# Patient Record
Sex: Male | Born: 1965 | Race: White | Hispanic: No | Marital: Married | Smoking: Never smoker
Health system: Southern US, Community
[De-identification: ages and names within clinical notes are randomized; demographics above are authoritative.]

## PROBLEM LIST (undated history)

## (undated) DIAGNOSIS — K5792 Diverticulitis of intestine, part unspecified, without perforation or abscess without bleeding: Secondary | ICD-10-CM

## (undated) DIAGNOSIS — M1990 Unspecified osteoarthritis, unspecified site: Secondary | ICD-10-CM

## (undated) DIAGNOSIS — M51369 Other intervertebral disc degeneration, lumbar region without mention of lumbar back pain or lower extremity pain: Secondary | ICD-10-CM

## (undated) DIAGNOSIS — F32A Depression, unspecified: Secondary | ICD-10-CM

## (undated) DIAGNOSIS — K219 Gastro-esophageal reflux disease without esophagitis: Secondary | ICD-10-CM

## (undated) DIAGNOSIS — I10 Essential (primary) hypertension: Secondary | ICD-10-CM

## (undated) DIAGNOSIS — G8929 Other chronic pain: Secondary | ICD-10-CM

## (undated) DIAGNOSIS — M5136 Other intervertebral disc degeneration, lumbar region: Secondary | ICD-10-CM

## (undated) DIAGNOSIS — N289 Disorder of kidney and ureter, unspecified: Secondary | ICD-10-CM

## (undated) HISTORY — PX: COLON SURGERY: SHX602

## (undated) HISTORY — PX: KNEE SURGERY: SHX244

---

## 2007-11-17 ENCOUNTER — Other Ambulatory Visit: Payer: Self-pay

## 2007-11-17 ENCOUNTER — Emergency Department: Payer: Self-pay

## 2012-03-31 ENCOUNTER — Observation Stay: Payer: Self-pay | Admitting: Internal Medicine

## 2012-03-31 LAB — URINALYSIS, COMPLETE
Blood: NEGATIVE
Glucose,UR: NEGATIVE mg/dL (ref 0–75)
RBC,UR: 9 /HPF (ref 0–5)
Specific Gravity: 1.027 (ref 1.003–1.030)
WBC UR: 15 /HPF (ref 0–5)

## 2012-03-31 LAB — COMPREHENSIVE METABOLIC PANEL
Albumin: 5.4 g/dL — ABNORMAL HIGH (ref 3.4–5.0)
Bilirubin,Total: 1.4 mg/dL — ABNORMAL HIGH (ref 0.2–1.0)
Calcium, Total: 10.4 mg/dL — ABNORMAL HIGH (ref 8.5–10.1)
Chloride: 100 mmol/L (ref 98–107)
Co2: 21 mmol/L (ref 21–32)
Creatinine: 2.63 mg/dL — ABNORMAL HIGH (ref 0.60–1.30)
Glucose: 163 mg/dL — ABNORMAL HIGH (ref 65–99)
Osmolality: 281 (ref 275–301)
Potassium: 4.1 mmol/L (ref 3.5–5.1)
SGOT(AST): 46 U/L — ABNORMAL HIGH (ref 15–37)
SGPT (ALT): 38 U/L
Sodium: 134 mmol/L — ABNORMAL LOW (ref 136–145)

## 2012-03-31 LAB — CBC
HCT: 51.2 % (ref 40.0–52.0)
MCHC: 33.5 g/dL (ref 32.0–36.0)
Platelet: 284 10*3/uL (ref 150–440)
RBC: 5.68 10*6/uL (ref 4.40–5.90)
RDW: 12.6 % (ref 11.5–14.5)
WBC: 17.3 10*3/uL — ABNORMAL HIGH (ref 3.8–10.6)

## 2012-03-31 LAB — CK TOTAL AND CKMB (NOT AT ARMC)
CK, Total: 966 U/L — ABNORMAL HIGH (ref 35–232)
CK-MB: 10.5 ng/mL — ABNORMAL HIGH (ref 0.5–3.6)

## 2012-03-31 LAB — MAGNESIUM: Magnesium: 2.2 mg/dL

## 2012-03-31 LAB — TROPONIN I: Troponin-I: <0.02 ng/mL

## 2012-04-01 LAB — CBC WITH DIFFERENTIAL/PLATELET
Basophil #: 0 10*3/uL (ref 0.0–0.1)
Eosinophil #: 0 10*3/uL (ref 0.0–0.7)
Eosinophil %: 0.1 %
HCT: 42.3 % (ref 40.0–52.0)
HGB: 14.1 g/dL (ref 13.0–18.0)
Lymphocyte #: 4.4 10*3/uL — ABNORMAL HIGH (ref 1.0–3.6)
MCH: 30.3 pg (ref 26.0–34.0)
MCHC: 33.3 g/dL (ref 32.0–36.0)
MCV: 91 fL (ref 80–100)
Monocyte #: 1.7 x10 3/mm — ABNORMAL HIGH (ref 0.2–1.0)
Neutrophil #: 10.4 10*3/uL — ABNORMAL HIGH (ref 1.4–6.5)
Neutrophil %: 62.8 %
Platelet: 228 10*3/uL (ref 150–440)
WBC: 16.5 10*3/uL — ABNORMAL HIGH (ref 3.8–10.6)

## 2012-04-01 LAB — LIPID PANEL
Cholesterol: 180 mg/dL (ref 0–200)
HDL Cholesterol: 34 mg/dL — ABNORMAL LOW (ref 40–60)
Ldl Cholesterol, Calc: 110 mg/dL — ABNORMAL HIGH (ref 0–100)
Triglycerides: 178 mg/dL (ref 0–200)

## 2012-04-01 LAB — BASIC METABOLIC PANEL
Anion Gap: 8 (ref 7–16)
Calcium, Total: 8.2 mg/dL — ABNORMAL LOW (ref 8.5–10.1)
Creatinine: 1.2 mg/dL (ref 0.60–1.30)
EGFR (African American): >60
Osmolality: 284 (ref 275–301)
Sodium: 140 mmol/L (ref 136–145)

## 2012-04-01 LAB — HEMOGLOBIN A1C: Hemoglobin A1C: 5.5 % (ref 4.2–6.3)

## 2012-10-04 ENCOUNTER — Emergency Department: Payer: Self-pay | Admitting: Emergency Medicine

## 2012-10-04 LAB — URINALYSIS, COMPLETE
Bacteria: NONE SEEN
Glucose,UR: NEGATIVE mg/dL (ref 0–75)
Ketone: NEGATIVE
Nitrite: NEGATIVE
Protein: NEGATIVE
RBC,UR: NONE SEEN /HPF (ref 0–5)
Squamous Epithelial: <1
WBC UR: NONE SEEN /HPF (ref 0–5)

## 2012-10-04 LAB — COMPREHENSIVE METABOLIC PANEL
Alkaline Phosphatase: 94 U/L (ref 50–136)
Bilirubin,Total: 1.3 mg/dL — ABNORMAL HIGH (ref 0.2–1.0)
Calcium, Total: 9.2 mg/dL (ref 8.5–10.1)
Chloride: 106 mmol/L (ref 98–107)
Co2: 23 mmol/L (ref 21–32)
Creatinine: 1.04 mg/dL (ref 0.60–1.30)
EGFR (Non-African Amer.): >60
Glucose: 115 mg/dL — ABNORMAL HIGH (ref 65–99)
SGOT(AST): 24 U/L (ref 15–37)
SGPT (ALT): 24 U/L (ref 12–78)
Total Protein: 7.5 g/dL (ref 6.4–8.2)

## 2012-10-04 LAB — CBC WITH DIFFERENTIAL/PLATELET
Basophil #: 0.1 10*3/uL (ref 0.0–0.1)
Eosinophil #: 0.1 10*3/uL (ref 0.0–0.7)
Eosinophil %: 0.5 %
HCT: 43.2 % (ref 40.0–52.0)
Lymphocyte #: 2.6 10*3/uL (ref 1.0–3.6)
MCHC: 34 g/dL (ref 32.0–36.0)
MCV: 89 fL (ref 80–100)
Monocyte #: 0.8 x10 3/mm (ref 0.2–1.0)
Monocyte %: 7.7 %
Neutrophil %: 67.1 %
Platelet: 256 10*3/uL (ref 150–440)
RBC: 4.87 10*6/uL (ref 4.40–5.90)
RDW: 12.7 % (ref 11.5–14.5)
WBC: 10.9 10*3/uL — ABNORMAL HIGH (ref 3.8–10.6)

## 2012-10-04 LAB — LIPASE, BLOOD: Lipase: 123 U/L (ref 73–393)

## 2013-12-28 ENCOUNTER — Emergency Department: Payer: Self-pay | Admitting: Emergency Medicine

## 2013-12-28 LAB — BASIC METABOLIC PANEL
ANION GAP: 5 — AB (ref 7–16)
BUN: 15 mg/dL (ref 7–18)
CHLORIDE: 108 mmol/L — AB (ref 98–107)
CO2: 26 mmol/L (ref 21–32)
Calcium, Total: 9 mg/dL (ref 8.5–10.1)
Creatinine: 1.02 mg/dL (ref 0.60–1.30)
EGFR (Non-African Amer.): >60
Glucose: 103 mg/dL — ABNORMAL HIGH (ref 65–99)
OSMOLALITY: 279 (ref 275–301)
Potassium: 4.6 mmol/L (ref 3.5–5.1)
Sodium: 139 mmol/L (ref 136–145)

## 2013-12-28 LAB — URINALYSIS, COMPLETE
Bacteria: NONE SEEN
Bilirubin,UR: NEGATIVE
Blood: NEGATIVE
Glucose,UR: NEGATIVE mg/dL (ref 0–75)
Ketone: NEGATIVE
Leukocyte Esterase: NEGATIVE
Nitrite: NEGATIVE
Ph: 6 (ref 4.5–8.0)
Protein: NEGATIVE
RBC,UR: NONE SEEN /HPF (ref 0–5)
Specific Gravity: 1.011 (ref 1.003–1.030)
Squamous Epithelial: NONE SEEN
WBC UR: NONE SEEN /HPF (ref 0–5)

## 2013-12-28 LAB — CBC
HCT: 39.4 % — AB (ref 40.0–52.0)
HGB: 13.4 g/dL (ref 13.0–18.0)
MCH: 30.3 pg (ref 26.0–34.0)
MCHC: 34.1 g/dL (ref 32.0–36.0)
MCV: 89 fL (ref 80–100)
PLATELETS: 226 10*3/uL (ref 150–440)
RBC: 4.42 10*6/uL (ref 4.40–5.90)
RDW: 14 % (ref 11.5–14.5)
WBC: 10.7 10*3/uL — AB (ref 3.8–10.6)

## 2014-04-10 ENCOUNTER — Emergency Department: Payer: Self-pay | Admitting: Emergency Medicine

## 2014-04-10 LAB — COMPREHENSIVE METABOLIC PANEL
ALT: 45 U/L (ref 12–78)
ANION GAP: 12 (ref 7–16)
AST: 36 U/L (ref 15–37)
Albumin: 4.6 g/dL (ref 3.4–5.0)
Alkaline Phosphatase: 87 U/L
BUN: 33 mg/dL — ABNORMAL HIGH (ref 7–18)
Bilirubin,Total: 1.4 mg/dL — ABNORMAL HIGH (ref 0.2–1.0)
CHLORIDE: 97 mmol/L — AB (ref 98–107)
CO2: 25 mmol/L (ref 21–32)
CREATININE: 1.7 mg/dL — AB (ref 0.60–1.30)
Calcium, Total: 9.7 mg/dL (ref 8.5–10.1)
GFR CALC AF AMER: 54 — AB
GFR CALC NON AF AMER: 46 — AB
GLUCOSE: 123 mg/dL — AB (ref 65–99)
Osmolality: 277 (ref 275–301)
Potassium: 3.8 mmol/L (ref 3.5–5.1)
Sodium: 134 mmol/L — ABNORMAL LOW (ref 136–145)
TOTAL PROTEIN: 8.4 g/dL — AB (ref 6.4–8.2)

## 2014-04-10 LAB — URINALYSIS, COMPLETE
BILIRUBIN, UR: NEGATIVE
BLOOD: NEGATIVE
Bacteria: NONE SEEN
GLUCOSE, UR: NEGATIVE mg/dL (ref 0–75)
Hyaline Cast: 14
KETONE: NEGATIVE
Leukocyte Esterase: NEGATIVE
Nitrite: NEGATIVE
PH: 5 (ref 4.5–8.0)
PROTEIN: NEGATIVE
RBC,UR: 1 /HPF (ref 0–5)
Specific Gravity: 1.025 (ref 1.003–1.030)

## 2014-04-10 LAB — CBC
HCT: 46.3 % (ref 40.0–52.0)
HGB: 15.5 g/dL (ref 13.0–18.0)
MCH: 29.4 pg (ref 26.0–34.0)
MCHC: 33.6 g/dL (ref 32.0–36.0)
MCV: 88 fL (ref 80–100)
PLATELETS: 283 10*3/uL (ref 150–440)
RBC: 5.29 10*6/uL (ref 4.40–5.90)
RDW: 13.7 % (ref 11.5–14.5)
WBC: 17.3 10*3/uL — AB (ref 3.8–10.6)

## 2014-04-10 LAB — DIFFERENTIAL
BASOS PCT: 0.7 %
Basophil #: 0.1 10*3/uL (ref 0.0–0.1)
EOS ABS: 0 10*3/uL (ref 0.0–0.7)
Eosinophil %: 0.3 %
LYMPHS ABS: 4.9 10*3/uL — AB (ref 1.0–3.6)
LYMPHS PCT: 28.5 %
MONO ABS: 1.6 x10 3/mm — AB (ref 0.2–1.0)
Monocyte %: 9.3 %
Neutrophil #: 10.6 10*3/uL — ABNORMAL HIGH (ref 1.4–6.5)
Neutrophil %: 61.2 %

## 2014-04-10 LAB — BASIC METABOLIC PANEL
ANION GAP: 7 (ref 7–16)
BUN: 27 mg/dL — ABNORMAL HIGH (ref 7–18)
Calcium, Total: 8.4 mg/dL — ABNORMAL LOW (ref 8.5–10.1)
Chloride: 102 mmol/L (ref 98–107)
Co2: 26 mmol/L (ref 21–32)
Creatinine: 1.56 mg/dL — ABNORMAL HIGH (ref 0.60–1.30)
EGFR (African American): 60 — ABNORMAL LOW
GFR CALC NON AF AMER: 51 — AB
GLUCOSE: 101 mg/dL — AB (ref 65–99)
Osmolality: 275 (ref 275–301)
POTASSIUM: 3.5 mmol/L (ref 3.5–5.1)
Sodium: 135 mmol/L — ABNORMAL LOW (ref 136–145)

## 2014-04-10 LAB — CK: CK, Total: 585 U/L — ABNORMAL HIGH

## 2014-04-10 LAB — LIPASE, BLOOD: LIPASE: 123 U/L (ref 73–393)

## 2014-04-10 LAB — TROPONIN I: Troponin-I: <0.02 ng/mL

## 2014-05-18 ENCOUNTER — Emergency Department: Payer: Self-pay | Admitting: Emergency Medicine

## 2014-05-18 LAB — CBC WITH DIFFERENTIAL/PLATELET
Basophil #: 0.1 10*3/uL (ref 0.0–0.1)
Basophil %: 0.4 %
EOS ABS: 0.1 10*3/uL (ref 0.0–0.7)
Eosinophil %: 0.3 %
HCT: 45.6 % (ref 40.0–52.0)
HGB: 15.5 g/dL (ref 13.0–18.0)
Lymphocyte #: 3.5 10*3/uL (ref 1.0–3.6)
Lymphocyte %: 22.6 %
MCH: 29.8 pg (ref 26.0–34.0)
MCHC: 34 g/dL (ref 32.0–36.0)
MCV: 88 fL (ref 80–100)
MONO ABS: 0.9 x10 3/mm (ref 0.2–1.0)
Monocyte %: 5.8 %
NEUTROS ABS: 10.8 10*3/uL — AB (ref 1.4–6.5)
NEUTROS PCT: 70.9 %
Platelet: 294 10*3/uL (ref 150–440)
RBC: 5.21 10*6/uL (ref 4.40–5.90)
RDW: 13.9 % (ref 11.5–14.5)
WBC: 15.3 10*3/uL — ABNORMAL HIGH (ref 3.8–10.6)

## 2014-05-18 LAB — URINALYSIS, COMPLETE
BLOOD: NEGATIVE
Bacteria: NONE SEEN
Bilirubin,UR: NEGATIVE
GLUCOSE, UR: NEGATIVE mg/dL (ref 0–75)
Ketone: NEGATIVE
LEUKOCYTE ESTERASE: NEGATIVE
NITRITE: NEGATIVE
PH: 6 (ref 4.5–8.0)
Protein: NEGATIVE
SQUAMOUS EPITHELIAL: NONE SEEN
Specific Gravity: 1.027 (ref 1.003–1.030)
WBC UR: 1 /HPF (ref 0–5)

## 2014-05-18 LAB — BASIC METABOLIC PANEL
ANION GAP: 7 (ref 7–16)
BUN: 27 mg/dL — AB (ref 7–18)
CO2: 25 mmol/L (ref 21–32)
Calcium, Total: 9.1 mg/dL (ref 8.5–10.1)
Chloride: 104 mmol/L (ref 98–107)
Creatinine: 1.66 mg/dL — ABNORMAL HIGH (ref 0.60–1.30)
EGFR (African American): 55 — ABNORMAL LOW
EGFR (Non-African Amer.): 48 — ABNORMAL LOW
GLUCOSE: 133 mg/dL — AB (ref 65–99)
Osmolality: 279 (ref 275–301)
Potassium: 4.1 mmol/L (ref 3.5–5.1)
SODIUM: 136 mmol/L (ref 136–145)

## 2014-05-18 LAB — CK
CK, Total: 542 U/L — ABNORMAL HIGH
CK, Total: 413 U/L — ABNORMAL HIGH

## 2014-10-13 ENCOUNTER — Emergency Department: Payer: Self-pay | Admitting: Emergency Medicine

## 2015-01-31 ENCOUNTER — Emergency Department: Admit: 2015-01-31 | Discharge status: Against Medical Advice | Payer: Self-pay | Admitting: Emergency Medicine

## 2015-01-31 LAB — CBC WITH DIFFERENTIAL/PLATELET
Basophil #: 0.1 10*3/uL (ref 0.0–0.1)
Basophil %: 0.5 %
EOS PCT: 0.1 %
Eosinophil #: 0 10*3/uL (ref 0.0–0.7)
HCT: 45.9 % (ref 40.0–52.0)
HGB: 15.7 g/dL (ref 13.0–18.0)
LYMPHS ABS: 2.4 10*3/uL (ref 1.0–3.6)
Lymphocyte %: 19 %
MCH: 29.9 pg (ref 26.0–34.0)
MCHC: 34.2 g/dL (ref 32.0–36.0)
MCV: 87 fL (ref 80–100)
MONOS PCT: 4.2 %
Monocyte #: 0.5 x10 3/mm (ref 0.2–1.0)
NEUTROS ABS: 9.5 10*3/uL — AB (ref 1.4–6.5)
Neutrophil %: 76.2 %
Platelet: 272 10*3/uL (ref 150–440)
RBC: 5.26 10*6/uL (ref 4.40–5.90)
RDW: 13.7 % (ref 11.5–14.5)
WBC: 12.5 10*3/uL — ABNORMAL HIGH (ref 3.8–10.6)

## 2015-01-31 LAB — COMPREHENSIVE METABOLIC PANEL
ALK PHOS: 81 U/L
ALT: 57 U/L
AST: 43 U/L — AB
Albumin: 4.6 g/dL
Anion Gap: 7 (ref 7–16)
BILIRUBIN TOTAL: 1.3 mg/dL — AB
BUN: 23 mg/dL — AB
Calcium, Total: 8.8 mg/dL — ABNORMAL LOW
Chloride: 104 mmol/L
Co2: 24 mmol/L
Creatinine: 1.08 mg/dL
EGFR (African American): >60
EGFR (Non-African Amer.): >60
Glucose: 150 mg/dL — ABNORMAL HIGH
Potassium: 4.2 mmol/L
Sodium: 135 mmol/L
Total Protein: 7.9 g/dL

## 2015-01-31 LAB — LIPASE, BLOOD: LIPASE: 27 U/L

## 2015-02-10 NOTE — H&P (Signed)
PATIENT NAME:  Gregory Oconnell, Gregory Oconnell MR#:  [id number] DATE OF BIRTH:  1966/08/08  DATE OF ADMISSION:  03/31/2012  CHIEF COMPLAINT: Weakness, muscle cramps for two days.   PRIMARY CARE PHYSICIAN: Nonlocal. REFERRING PHYSICIAN: Dr. Renard Hamper  HISTORY OF PRESENT ILLNESS: 49 year old gentleman with no past medical history presented to the ED with above chief complaint. Patient is alert, awake, oriented in no acute distress. He said he has been working outside feeling weak, thirsty, dizzy, hot and sweating a lot. In addition he has generalized body muscle cramps so he came to ED for further evaluation. He was noted to have elevated creatinine about 2.4 and increased CK, CK-MB so he was treated with normal saline bolus for heat exhaustion and admitted for acute renal failure. Patient denies any fever, chills. No chest pain, palpitation, orthopnea, or nocturnal dyspnea. No abdominal pain but has nausea. No vomiting or diarrhea. Patient has urine early this morning but so far no urine.    PAST MEDICAL HISTORY: No.   PAST SURGICAL HISTORY: Right knee tendon repair.   SOCIAL HISTORY: No smoking, alcohol drinking, but smokes some marijuana sometimes.   FAMILY HISTORY: Diabetes.   MEDICATIONS: No.  ALLERGIES: No.    REVIEW OF SYSTEMS: CONSTITUTIONAL: Patient denies any fever or chills. No headache but has dizziness, generalized weakness. HEENT: No double vision, blurred vision. No postnasal drip, epistaxis or dysphagia. RESPIRATORY: No cough, sputum, shortness of breath, or hemoptysis. CARDIOVASCULAR: No chest pain, palpitation, orthopnea or nocturnal dyspnea. No leg edema. GASTROINTESTINAL: Has nausea but no vomiting or diarrhea. No abdominal pain. No melena, bloody stool. GENITOURINARY: No dysuria, hematuria, or incontinence but has oliguria. ENDOCRINE: No polyuria, polydipsia but thirsty for the past two days. No heat or cold intolerance. HEMATOLOGY: No easy bruising, bleeding. NEUROLOGY: No syncope, loss of  consciousness or seizure.   PHYSICAL EXAMINATION:  VITAL SIGNS: Temperature 98, blood pressure 149/97, pulse 60, oxygen saturation 99% on room air, respiration 22.   GENERAL: Patient is alert, awake, oriented in no acute distress.   HEENT: Pupils round, equal, reactive to light and accommodation. Mild dry oral mucosa. Clear oropharynx.   NECK: Supple. No JVD or carotid bruit. No lymphadenopathy. No thyromegaly.   CARDIOVASCULAR: S1, S2 regular rate, rhythm. No murmurs, gallops.   PULMONARY: Bilateral air entry. No wheezing, rales.   ABDOMEN: Soft. No distention or tenderness. No organomegaly. Bowel sounds present.   EXTREMITIES: No edema, clubbing, or cyanosis. No calf tenderness. Strong bilateral pedal pulses.   SKIN: No rash or jaundice.   NEUROLOGY: Alert and oriented x3. No focal deficit. Power 5/5. Sensation intact. Deep tendon reflexes 2+.   LABORATORY, DIAGNOSTIC AND RADIOLOGICAL DATA: CK 966, CK-MB 10.5, glucose 163, BUN 37, creatinine 2.63, sodium 134, chloride 4.1, bicarbonate 21, anion gap 13, WBC 17.3, hemoglobin 17.2, platelets 284. EKG shows sinus bradycardia with marked sinus arrhythmia at 56.   IMPRESSION:  1. Heat exhaustion.  2. Acute renal failure and dehydration.  3. Rhabdomyolysis.  4. Leukocytosis, possibly due to reaction.  5. Bradycardia.  6. Hyponatremia.   PLAN OF TREATMENT:  1. Patient will be placed for observation. Patient was treated with normal saline bolus. Will continue normal saline at 200 mL/h, encourage fluid intake and follow-up BMP, CK, CK-MB.  2. Will check lipid panel, hemoglobin A1c.   3. Patient's blood pressure is high, possible has hypertension or high blood pressure is due to stress. We will monitor blood pressure and may need hypertension medication later.   Discussed the patient's situation  and plan of treatment with the patient.  TIME SPENT: About 50 minutes.  ____________________________ Demetrios Loll, MD qc:cms D: 03/31/2012  16:15:49 ET T: 03/31/2012 20:34:47 ET JOB#: [id number]  cc: Demetrios Loll, MD, <Dictator> Demetrios Loll MD ELECTRONICALLY SIGNED 04/01/2012 19:39

## 2015-02-10 NOTE — Discharge Summary (Signed)
PATIENT NAME:  Gregory Oconnell, Gregory Oconnell MR#:  [id number] DATE OF BIRTH:  11-09-1965  DATE OF ADMISSION:  03/31/2012 DATE OF DISCHARGE:  04/01/2012  ADMISSION DIAGNOSES:  1. Acute renal failure.  2. Mild rhabdomyolysis.   DISCHARGE DIAGNOSES:  1. Acute renal failure.  2. Mild rhabdomyolysis.  3. Urinary tract infection.  4. Leukocytosis.   PERTINENT LABORATORY AT DISCHARGE: Sodium 140, potassium 4.0, chloride 110, bicarb 22, BUN 23, creatinine 1.20, glucose 116. LDL 110, VLDL 36, cholesterol 180, triglycerides 178, HDL 34. Hemoglobin A1c 5.5. CK 602. White blood cells 16.5, hemoglobin 14, hematocrit 43, platelets 228.   HOSPITAL COURSE: This is a 49 year old male who presented with dehydration and acute renal failure. For further details, please refer to the history and physical.  1. Acute renal failure secondary to dehydration. The patient's creatinine is back to baseline after IV fluids. The patient's dehydration is secondary to heat exhaustion. The patient was counseled on making sure he has enough fluids when he is participating in activities outside. 2. Rhabdomyolysis, improved with IV fluids.  3. Urinary tract infection. The patient was started on ciprofloxacin.  4. Leukocytosis from urinary tract infection.   DISCHARGE MEDICATIONS: Ciprofloxacin 500 b.i.d. for seven days.   DISCHARGE DIET: Regular.   DISCHARGE ACTIVITY: As tolerated.   DISCHARGE FOLLOW-UP: [facility] in one week.   TIME SPENT: 35 minutes.    ____________________________ Donell Beers. Benjie Karvonen, MD spm:drc D: 04/01/2012 12:42:22 ET T: 04/02/2012 15:31:28 ET JOB#: [id number]  cc: Brandis Wixted P. Benjie Karvonen, MD, <Dictator> [facility] Paislynn Hegstrom P Marrian Bells MD ELECTRONICALLY SIGNED 04/03/2012 21:30

## 2016-01-02 ENCOUNTER — Other Ambulatory Visit: Payer: Self-pay | Admitting: Nurse Practitioner

## 2016-01-02 DIAGNOSIS — R109 Unspecified abdominal pain: Principal | ICD-10-CM

## 2016-01-02 DIAGNOSIS — R110 Nausea: Secondary | ICD-10-CM

## 2016-01-02 DIAGNOSIS — R195 Other fecal abnormalities: Secondary | ICD-10-CM

## 2016-01-02 DIAGNOSIS — K219 Gastro-esophageal reflux disease without esophagitis: Secondary | ICD-10-CM

## 2016-01-02 DIAGNOSIS — G8929 Other chronic pain: Secondary | ICD-10-CM

## 2016-01-02 DIAGNOSIS — K529 Noninfective gastroenteritis and colitis, unspecified: Secondary | ICD-10-CM

## 2016-01-09 ENCOUNTER — Ambulatory Visit
Admission: RE | Admit: 2016-01-09 | Discharge: 2016-01-09 | Disposition: A | Discharge status: Home / Self Care | Payer: Medicaid Other | Source: Ambulatory Visit | Attending: Nurse Practitioner | Admitting: Nurse Practitioner

## 2016-01-09 DIAGNOSIS — K219 Gastro-esophageal reflux disease without esophagitis: Secondary | ICD-10-CM

## 2016-01-09 DIAGNOSIS — G8929 Other chronic pain: Secondary | ICD-10-CM | POA: Diagnosis present

## 2016-01-09 DIAGNOSIS — K529 Noninfective gastroenteritis and colitis, unspecified: Secondary | ICD-10-CM

## 2016-01-09 DIAGNOSIS — K760 Fatty (change of) liver, not elsewhere classified: Secondary | ICD-10-CM | POA: Insufficient documentation

## 2016-01-09 DIAGNOSIS — R109 Unspecified abdominal pain: Secondary | ICD-10-CM | POA: Diagnosis not present

## 2016-01-09 DIAGNOSIS — R195 Other fecal abnormalities: Secondary | ICD-10-CM

## 2016-01-09 DIAGNOSIS — R110 Nausea: Secondary | ICD-10-CM

## 2016-01-09 DIAGNOSIS — K921 Melena: Secondary | ICD-10-CM | POA: Diagnosis present

## 2016-01-09 MED ORDER — IOPAMIDOL (ISOVUE-370) INJECTION 76%
100.0000 mL | Freq: Once | INTRAVENOUS | Status: AC | PRN
  Administered 2016-01-09: 100 mL via INTRAVENOUS

## 2016-01-15 ENCOUNTER — Other Ambulatory Visit: Payer: Self-pay | Admitting: Nurse Practitioner

## 2016-01-15 ENCOUNTER — Other Ambulatory Visit: Payer: Self-pay | Admitting: *Deleted

## 2016-01-15 DIAGNOSIS — K760 Fatty (change of) liver, not elsewhere classified: Secondary | ICD-10-CM

## 2016-01-24 ENCOUNTER — Ambulatory Visit
Admission: RE | Admit: 2016-01-24 | Discharge: 2016-01-24 | Disposition: A | Discharge status: Home / Self Care | Payer: Medicaid Other | Source: Ambulatory Visit | Attending: Nurse Practitioner | Admitting: Nurse Practitioner

## 2016-01-24 DIAGNOSIS — K760 Fatty (change of) liver, not elsewhere classified: Secondary | ICD-10-CM | POA: Diagnosis present

## 2016-01-24 DIAGNOSIS — M7541 Impingement syndrome of right shoulder: Secondary | ICD-10-CM | POA: Insufficient documentation

## 2016-01-30 ENCOUNTER — Encounter: Payer: Self-pay | Admitting: *Deleted

## 2016-02-03 ENCOUNTER — Ambulatory Visit: Payer: Medicaid Other | Admitting: Anesthesiology

## 2016-02-03 ENCOUNTER — Ambulatory Visit
Admission: RE | Admit: 2016-02-03 | Discharge: 2016-02-03 | Disposition: A | Discharge status: Home / Self Care | Payer: Medicaid Other | Source: Ambulatory Visit | Attending: Gastroenterology | Admitting: Gastroenterology

## 2016-02-03 ENCOUNTER — Encounter
Admission: RE | Disposition: A | Discharge status: Home / Self Care | Payer: Self-pay | Source: Ambulatory Visit | Attending: Gastroenterology

## 2016-02-03 ENCOUNTER — Encounter: Payer: Self-pay | Admitting: *Deleted

## 2016-02-03 DIAGNOSIS — K210 Gastro-esophageal reflux disease with esophagitis: Secondary | ICD-10-CM | POA: Diagnosis not present

## 2016-02-03 DIAGNOSIS — K529 Noninfective gastroenteritis and colitis, unspecified: Secondary | ICD-10-CM | POA: Diagnosis not present

## 2016-02-03 DIAGNOSIS — M1990 Unspecified osteoarthritis, unspecified site: Secondary | ICD-10-CM | POA: Diagnosis not present

## 2016-02-03 DIAGNOSIS — R195 Other fecal abnormalities: Secondary | ICD-10-CM | POA: Diagnosis not present

## 2016-02-03 DIAGNOSIS — B3781 Candidal esophagitis: Secondary | ICD-10-CM | POA: Insufficient documentation

## 2016-02-03 DIAGNOSIS — R1030 Lower abdominal pain, unspecified: Secondary | ICD-10-CM | POA: Insufficient documentation

## 2016-02-03 HISTORY — PX: COLONOSCOPY WITH PROPOFOL: SHX5780

## 2016-02-03 HISTORY — DX: Unspecified osteoarthritis, unspecified site: M19.90

## 2016-02-03 HISTORY — DX: Gastro-esophageal reflux disease without esophagitis: K21.9

## 2016-02-03 HISTORY — DX: Disorder of kidney and ureter, unspecified: N28.9

## 2016-02-03 HISTORY — PX: ESOPHAGOGASTRODUODENOSCOPY (EGD) WITH PROPOFOL: SHX5813

## 2016-02-03 LAB — KOH PREP

## 2016-02-03 SURGERY — ESOPHAGOGASTRODUODENOSCOPY (EGD) WITH PROPOFOL
Anesthesia: General

## 2016-02-03 MED ORDER — PROPOFOL 10 MG/ML IV BOLUS
INTRAVENOUS | Status: DC | PRN
  Administered 2016-02-03: 150 mg via INTRAVENOUS
  Administered 2016-02-03: 50 mg via INTRAVENOUS

## 2016-02-03 MED ORDER — PROPOFOL 500 MG/50ML IV EMUL
INTRAVENOUS | Status: DC | PRN
  Administered 2016-02-03: 150 ug/kg/min via INTRAVENOUS

## 2016-02-03 MED ORDER — FENTANYL CITRATE (PF) 100 MCG/2ML IJ SOLN
INTRAMUSCULAR | Status: DC | PRN
  Administered 2016-02-03: 50 ug via INTRAVENOUS

## 2016-02-03 MED ORDER — SODIUM CHLORIDE 0.9 % IV SOLN
INTRAVENOUS | Status: DC
  Administered 2016-02-03: 1000 mL via INTRAVENOUS

## 2016-02-03 NOTE — Transfer of Care (Signed)
Immediate Anesthesia Transfer of Care Note  Patient: Gregory Oconnell  Procedure(s) Performed: Procedure(s): ESOPHAGOGASTRODUODENOSCOPY (EGD) WITH PROPOFOL (N/A) COLONOSCOPY WITH PROPOFOL (N/A)  Patient Location: PACU  Anesthesia Type:General  Level of Consciousness: awake, alert , oriented and patient cooperative  Airway & Oxygen Therapy: Patient Spontanous Breathing and Patient connected to nasal cannula oxygen  Post-op Assessment: Report given to RN, Post -op Vital signs reviewed and stable and Patient moving all extremities  Post vital signs: Reviewed and stable  Last Vitals:  Filed Vitals:   02/03/16 1004  BP: 149/89  Pulse: 61  Temp: 36.7 C  Resp: 20    Complications: No apparent anesthesia complications

## 2016-02-03 NOTE — Op Note (Signed)
[facility] Gastroenterology Patient Name: Gregory Oconnell Procedure Date: 02/03/2016 10:28 AM MRN: [id number] Account #: [id number] Date of Birth: July 14, 1966 Admit Type: Outpatient Age: 50 Room: [facility] ENDO ROOM 4 Gender: Male Note Status: Finalized Procedure:            Upper GI endoscopy Indications:          Suspected esophageal reflux, Nausea with vomiting Providers:            Lupita Dawn. Candace Cruise, MD Referring MD:         Dr Juanda Crumble [facility], MD (Referring MD) Medicines:            Monitored Anesthesia Care Complications:        No immediate complications. Procedure:            Pre-Anesthesia Assessment:                       - Prior to the procedure, a History and Physical was                        performed, and patient medications, allergies and                        sensitivities were reviewed. The patient's tolerance of                        previous anesthesia was reviewed.                       - The risks and benefits of the procedure and the                        sedation options and risks were discussed with the                        patient. All questions were answered and informed                        consent was obtained.                       - After reviewing the risks and benefits, the patient                        was deemed in satisfactory condition to undergo the                        procedure.                       After obtaining informed consent, the endoscope was                        passed under direct vision. Throughout the procedure,                        the patient's blood pressure, pulse, and oxygen                        saturations were monitored continuously. The Endoscope  was introduced through the mouth, and advanced to the                        second part of duodenum. The upper GI endoscopy was                        accomplished without difficulty. The patient tolerated   the procedure well. Findings:      Mildly severe esophagitis was found in the middle third of the       esophagus. Cells for cytology were obtained by brushing.      There were esophageal mucosal changes suspicious for short-segment       Barrett's esophagus present in the lower third of the esophagus. The       maximum longitudinal extent of these mucosal changes was 2 cm in length.       Mucosa was biopsied with a cold forceps for histology in 4 quadrants.       One specimen bottle was sent to pathology.      The exam was otherwise without abnormality.      The entire examined stomach was normal.      The examined duodenum was normal. Impression:           - Mildly severe candidiasis esophagitis. Cells for                        cytology obtained.                       - Esophageal mucosal changes suspicious for                        short-segment Barrett's esophagus. Biopsied.                       - The examination was otherwise normal.                       - Normal stomach.                       - Normal examined duodenum. Recommendation:       - Discharge patient to home.                       - Observe patient's clinical course.                       - Await pathology results.                       - The findings and recommendations were discussed with                        the patient. Procedure Code(s):    --- Professional ---                       [phone], Esophagogastroduodenoscopy, flexible, transoral;                        with biopsy, single or multiple Diagnosis Code(s):    --- Professional ---  B37.81, Candidal esophagitis                       K22.8, Other specified diseases of esophagus                       R11.2, Nausea with vomiting, unspecified CPT copyright 2016 American Medical Association. All rights reserved. The codes documented in this report are preliminary and upon coder review may  be revised to meet current compliance  requirements. Hulen Luster, MD 02/03/2016 10:42:49 AM This report has been signed electronically. Number of Addenda: 0 Note Initiated On: 02/03/2016 10:28 AM      [facility]

## 2016-02-03 NOTE — Anesthesia Preprocedure Evaluation (Signed)
Anesthesia Evaluation  Patient identified by MRN, date of birth, ID band Patient awake    Reviewed: Allergy & Precautions, H&P , NPO status , Patient's Chart, lab work & pertinent test results, reviewed documented beta blocker date and time   Airway Mallampati: II   Neck ROM: full    Dental  (+) Teeth Intact   Pulmonary neg pulmonary ROS,    Pulmonary exam normal        Cardiovascular negative cardio ROS Normal cardiovascular exam Rate:Normal     Neuro/Psych negative neurological ROS  negative psych ROS   GI/Hepatic negative GI ROS, Neg liver ROS, GERD  ,  Endo/Other  negative endocrine ROS  Renal/GU Renal diseasenegative Renal ROS  negative genitourinary   Musculoskeletal   Abdominal   Peds  Hematology negative hematology ROS (+)   Anesthesia Other Findings Past Medical History:   GERD (gastroesophageal reflux disease)                       Kidney disease                                               Arthritis                                                  Past Surgical History:   KNEE SURGERY                                                BMI    Body Mass Index   33.08 kg/m 2     Reproductive/Obstetrics                             Anesthesia Physical Anesthesia Plan  ASA: II  Anesthesia Plan: General   Post-op Pain Management:    Induction:   Airway Management Planned:   Additional Equipment:   Intra-op Plan:   Post-operative Plan:   Informed Consent: I have reviewed the patients History and Physical, chart, labs and discussed the procedure including the risks, benefits and alternatives for the proposed anesthesia with the patient or authorized representative who has indicated his/her understanding and acceptance.   Dental Advisory Given  Plan Discussed with: CRNA  Anesthesia Plan Comments:         Anesthesia Quick Evaluation

## 2016-02-03 NOTE — Op Note (Signed)
[facility] Gastroenterology Patient Name: Gregory Oconnell Procedure Date: 02/03/2016 10:26 AM MRN: [id number] Account #: [id number] Date of Birth: 1966/09/28 Admit Type: Outpatient Age: 50 Room: [facility] ENDO ROOM 4 Gender: Male Note Status: Finalized Procedure:            Colonoscopy Indications:          Lower abdominal pain, Chronic diarrhea, Heme positive                        stool Providers:            Lupita Dawn. Candace Cruise, MD Referring MD:         Dr Juanda Crumble [facility], MD (Referring MD) Medicines:            Monitored Anesthesia Care Complications:        No immediate complications. Procedure:            Pre-Anesthesia Assessment:                       - Prior to the procedure, a History and Physical was                        performed, and patient medications, allergies and                        sensitivities were reviewed. The patient's tolerance of                        previous anesthesia was reviewed.                       - The risks and benefits of the procedure and the                        sedation options and risks were discussed with the                        patient. All questions were answered and informed                        consent was obtained.                       - After reviewing the risks and benefits, the patient                        was deemed in satisfactory condition to undergo the                        procedure.                       After obtaining informed consent, the colonoscope was                        passed under direct vision. Throughout the procedure,                        the patient's blood pressure, pulse, and oxygen  saturations were monitored continuously. The                        Colonoscope was introduced through the anus and                        advanced to the the cecum, identified by appendiceal                        orifice and ileocecal valve. The colonoscopy was          performed without difficulty. The patient tolerated the                        procedure well. The quality of the bowel preparation                        was poor. Findings:      The colon (entire examined portion) appeared normal. Biopsies for       histology were taken with a cold forceps from the entire colon for       evaluation of microscopic colitis. Impression:           - Preparation of the colon was poor.                       - The entire examined colon is normal. Biopsied. Recommendation:       - Await pathology results.                       - Repeat colonoscopy in 5-10 years for surveillance.                       - The findings and recommendations were discussed with                        the patient.                       - Needs better prrep next time. Procedure Code(s):    --- Professional ---                       [phone], Colonoscopy, flexible; with biopsy, single or                        multiple Diagnosis Code(s):    --- Professional ---                       R10.30, Lower abdominal pain, unspecified                       K52.9, Noninfective gastroenteritis and colitis,                        unspecified                       R19.5, Other fecal abnormalities CPT copyright 2016 American Medical Association. All rights reserved. The codes documented in this report are preliminary and upon coder review may  be revised to meet current compliance requirements. Hulen Luster, MD 02/03/2016 10:59:24 AM This report has been signed electronically. Number  of Addenda: 0 Note Initiated On: 02/03/2016 10:26 AM Scope Withdrawal Time: 0 hours 4 minutes 13 seconds  Total Procedure Duration: 0 hours 12 minutes 1 second       [facility]

## 2016-02-03 NOTE — H&P (Signed)
    Primary Care Physician:  Donnie Coffin, MD Primary Gastroenterologist:  Dr. Candace Cruise  Pre-Procedure History & Physical: HPI:  Gregory Oconnell is a 50 y.o. male is here for an EGD/colonoscopy.   Past Medical History  Diagnosis Date  . GERD (gastroesophageal reflux disease)   . Kidney disease   . Arthritis     Past Surgical History  Procedure Laterality Date  . Knee surgery      Prior to Admission medications   Medication Sig Start Date End Date Taking? Authorizing Provider  gabapentin (NEURONTIN) 300 MG capsule Take 300 mg by mouth daily.   Yes Historical Provider, MD  omeprazole (PRILOSEC) 40 MG capsule Take 40 mg by mouth daily.   Yes Historical Provider, MD    Allergies as of 01/18/2016  . (No Known Allergies)    History reviewed. No pertinent family history.  Social History   Social History  . Marital Status: Married    Spouse Name: N/A  . Number of Children: N/A  . Years of Education: N/A   Occupational History  . Not on file.   Social History Main Topics  . Smoking status: Never Smoker   . Smokeless tobacco: Never Used  . Alcohol Use: Yes  . Drug Use: Yes    Special: Marijuana  . Sexual Activity: Not on file   Other Topics Concern  . Not on file   Social History Narrative    Review of Systems: See HPI, otherwise negative ROS  Physical Exam: BP 149/89 mmHg  Pulse 61  Temp(Src) 98 F (36.7 C) (Oral)  Resp 20  Ht 6' (1.829 m)  Wt 110.678 kg (244 lb)  BMI 33.09 kg/m2  SpO2 99% General:   Alert,  pleasant and cooperative in NAD Head:  Normocephalic and atraumatic. Neck:  Supple; no masses or thyromegaly. Lungs:  Clear throughout to auscultation.    Heart:  Regular rate and rhythm. Abdomen:  Soft, nontender and nondistended. Normal bowel sounds, without guarding, and without rebound.   Neurologic:  Alert and  oriented x4;  grossly normal neurologically.  Impression/Plan: RANIER VANDERWOUDE is here for an EGD/colonoscopy to be performed for  nausea, vomiting, diarrhea, abdominal pain, heme positive stool  Risks, benefits, limitations, and alternatives regarding EGD/colonoscopy have been reviewed with the patient.  Questions have been answered.  All parties agreeable.   Trinton Prewitt, Lupita Dawn, MD  02/03/2016, 10:21 AM

## 2016-02-03 NOTE — Anesthesia Postprocedure Evaluation (Signed)
Anesthesia Post Note  Patient: Gregory Oconnell  Procedure(s) Performed: Procedure(s) (LRB): ESOPHAGOGASTRODUODENOSCOPY (EGD) WITH PROPOFOL (N/A) COLONOSCOPY WITH PROPOFOL (N/A)  Patient location during evaluation: PACU Anesthesia Type: General Level of consciousness: awake and alert Pain management: pain level controlled Vital Signs Assessment: post-procedure vital signs reviewed and stable Respiratory status: spontaneous breathing, nonlabored ventilation, respiratory function stable and patient connected to nasal cannula oxygen Cardiovascular status: blood pressure returned to baseline and stable Postop Assessment: no signs of nausea or vomiting Anesthetic complications: no    Last Vitals:  Filed Vitals:   02/03/16 1004  BP: 149/89  Pulse: 61  Temp: 36.7 C  Resp: 20    Last Pain:  Filed Vitals:   02/03/16 1113  PainSc: [place] G Adams

## 2016-02-03 NOTE — Addendum Note (Signed)
Addendum  created 02/03/16 1156 by Kevion Fatheree, CRNA   Modules edited: Anesthesia Flowsheet    

## 2016-02-04 LAB — SURGICAL PATHOLOGY

## 2016-03-16 ENCOUNTER — Emergency Department
Admission: EM | Admit: 2016-03-16 | Discharge: 2016-03-16 | Disposition: A | Discharge status: Home / Self Care | Payer: Medicaid Other | Attending: Student | Admitting: Student

## 2016-03-16 ENCOUNTER — Emergency Department: Payer: Medicaid Other

## 2016-03-16 ENCOUNTER — Encounter: Payer: Self-pay | Admitting: Emergency Medicine

## 2016-03-16 DIAGNOSIS — R1030 Lower abdominal pain, unspecified: Secondary | ICD-10-CM | POA: Diagnosis present

## 2016-03-16 DIAGNOSIS — R109 Unspecified abdominal pain: Secondary | ICD-10-CM

## 2016-03-16 DIAGNOSIS — M1990 Unspecified osteoarthritis, unspecified site: Secondary | ICD-10-CM | POA: Insufficient documentation

## 2016-03-16 DIAGNOSIS — R1031 Right lower quadrant pain: Secondary | ICD-10-CM

## 2016-03-16 DIAGNOSIS — R52 Pain, unspecified: Secondary | ICD-10-CM

## 2016-03-16 LAB — CBC WITH DIFFERENTIAL/PLATELET
BASOS ABS: 0.1 10*3/uL (ref 0–0.1)
Eosinophils Absolute: 0.3 10*3/uL (ref 0–0.7)
HCT: 41.7 % (ref 40.0–52.0)
Hemoglobin: 14 g/dL (ref 13.0–18.0)
Lymphocytes Relative: 35 %
Lymphs Abs: 4.4 10*3/uL — ABNORMAL HIGH (ref 1.0–3.6)
MCH: 29.8 pg (ref 26.0–34.0)
MCHC: 33.7 g/dL (ref 32.0–36.0)
MCV: 88.4 fL (ref 80.0–100.0)
MONO ABS: 0.7 10*3/uL (ref 0.2–1.0)
Monocytes Relative: 6 %
Neutro Abs: 7.2 10*3/uL — ABNORMAL HIGH (ref 1.4–6.5)
Neutrophils Relative %: 55 %
PLATELETS: 253 10*3/uL (ref 150–440)
RBC: 4.72 MIL/uL (ref 4.40–5.90)
RDW: 14.2 % (ref 11.5–14.5)
WBC: 12.8 10*3/uL — ABNORMAL HIGH (ref 3.8–10.6)

## 2016-03-16 LAB — COMPREHENSIVE METABOLIC PANEL
ALBUMIN: 4.1 g/dL (ref 3.5–5.0)
ALT: 34 U/L (ref 17–63)
ANION GAP: 7 (ref 5–15)
AST: 31 U/L (ref 15–41)
Alkaline Phosphatase: 67 U/L (ref 38–126)
BUN: 13 mg/dL (ref 6–20)
CHLORIDE: 107 mmol/L (ref 101–111)
CO2: 25 mmol/L (ref 22–32)
Calcium: 9.1 mg/dL (ref 8.9–10.3)
Creatinine, Ser: 1.21 mg/dL (ref 0.61–1.24)
GFR calc Af Amer: >60 mL/min (ref >60)
GLUCOSE: 106 mg/dL — AB (ref 65–99)
POTASSIUM: 3.8 mmol/L (ref 3.5–5.1)
Sodium: 139 mmol/L (ref 135–145)
Total Bilirubin: 1 mg/dL (ref 0.3–1.2)
Total Protein: 7.2 g/dL (ref 6.5–8.1)

## 2016-03-16 LAB — URINALYSIS COMPLETE WITH MICROSCOPIC (ARMC ONLY)
Bacteria, UA: NONE SEEN
Bilirubin Urine: NEGATIVE
Glucose, UA: NEGATIVE mg/dL
HGB URINE DIPSTICK: NEGATIVE
KETONES UR: NEGATIVE mg/dL
LEUKOCYTES UA: NEGATIVE
NITRITE: NEGATIVE
PH: 6 (ref 5.0–8.0)
PROTEIN: 30 mg/dL — AB
SPECIFIC GRAVITY, URINE: 1.023 (ref 1.005–1.030)
Squamous Epithelial / LPF: NONE SEEN

## 2016-03-16 MED ORDER — PROMETHAZINE HCL 12.5 MG PO TABS: 12.5000 mg | ORAL_TABLET | Freq: Four times a day (QID) | ORAL | Status: DC | PRN

## 2016-03-16 MED ORDER — MORPHINE SULFATE (PF) 4 MG/ML IV SOLN
4.0000 mg | Freq: Once | INTRAVENOUS | Status: AC
  Administered 2016-03-16: 4 mg via INTRAVENOUS
  Filled 2016-03-16: qty 1

## 2016-03-16 MED ORDER — KETOROLAC TROMETHAMINE 30 MG/ML IJ SOLN
INTRAMUSCULAR | Status: AC
  Administered 2016-03-16: 15 mg via INTRAVENOUS
  Filled 2016-03-16: qty 1

## 2016-03-16 MED ORDER — HYDROCODONE-ACETAMINOPHEN 5-325 MG PO TABS: 1.0000 | ORAL_TABLET | ORAL | Status: DC | PRN

## 2016-03-16 MED ORDER — SODIUM CHLORIDE 0.9 % IV BOLUS (SEPSIS)
1000.0000 mL | Freq: Once | INTRAVENOUS | Status: AC
  Administered 2016-03-16: 1000 mL via INTRAVENOUS

## 2016-03-16 MED ORDER — ONDANSETRON HCL 4 MG/2ML IJ SOLN
4.0000 mg | Freq: Once | INTRAMUSCULAR | Status: AC
  Administered 2016-03-16: 4 mg via INTRAVENOUS
  Filled 2016-03-16: qty 2

## 2016-03-16 MED ORDER — KETOROLAC TROMETHAMINE 30 MG/ML IJ SOLN
15.0000 mg | Freq: Once | INTRAMUSCULAR | Status: AC
  Administered 2016-03-16: 15 mg via INTRAVENOUS

## 2016-03-16 NOTE — ED Notes (Signed)
Patient back from ultrasound and CTscan.

## 2016-03-16 NOTE — ED Notes (Signed)
Patient left for Ultrasound.

## 2016-03-16 NOTE — Discharge Instructions (Signed)

## 2016-03-16 NOTE — ED Notes (Signed)
E-signature pad did not function. Patient unable to sign. Patient left for home with wife.

## 2016-03-16 NOTE — ED Provider Notes (Signed)
I was asked to follow up on CT scan by Dr. Edd Fabian, CT is unremarkable. Discussed results with patient. He is feeling improved.  Lavonia Drafts, MD 03/16/16 2204

## 2016-03-16 NOTE — ED Provider Notes (Signed)
[facility] Emergency Department Provider Note   ____________________________________________  Time seen: Approximately 7:01 PM  I have reviewed the triage vital signs and the nursing notes.   HISTORY  Chief Complaint Flank Pain and Groin Pain    HPI Gregory Oconnell is a 50 y.o. male with GERD who presents for evaluation of right flank pain, right lower abdominal pain and right groin/testicle pain gradual onset today, constant, no modifying factors, currently severe. No fevers or chills. He has had nausea and one episode non-bloody nonbilious emesis. No diarrhea, no fevers or chills. No chest pain or difficulty breathing.   Past Medical History  Diagnosis Date  . GERD (gastroesophageal reflux disease)   . Kidney disease   . Arthritis     There are no active problems to display for this patient.   Past Surgical History  Procedure Laterality Date  . Knee surgery    . Esophagogastroduodenoscopy (egd) with propofol N/A 02/03/2016    Procedure: ESOPHAGOGASTRODUODENOSCOPY (EGD) WITH PROPOFOL;  Surgeon: Hulen Luster, MD;  Location: [facility] ENDOSCOPY;  Service: Gastroenterology;  Laterality: N/A;  . Colonoscopy with propofol N/A 02/03/2016    Procedure: COLONOSCOPY WITH PROPOFOL;  Surgeon: Hulen Luster, MD;  Location: [facility] ENDOSCOPY;  Service: Gastroenterology;  Laterality: N/A;    Current Outpatient Rx  Name  Route  Sig  Dispense  Refill  . gabapentin (NEURONTIN) 300 MG capsule   Oral   Take 300 mg by mouth daily.         Marland Kitchen omeprazole (PRILOSEC) 40 MG capsule   Oral   Take 40 mg by mouth daily.           Allergies Review of patient's allergies indicates no known allergies.  History reviewed. No pertinent family history.  Social History Social History  Substance Use Topics  . Smoking status: Never Smoker   . Smokeless tobacco: Never Used  . Alcohol Use: Yes    Review of Systems Constitutional: No fever/chills Eyes: No visual  changes. ENT: No sore throat. Cardiovascular: Denies chest pain. Respiratory: Denies shortness of breath. Gastrointestinal: + abdominal pain.  + nausea, + vomiting.  No diarrhea.  No constipation. Genitourinary: Negative for dysuria. Musculoskeletal: Positive for right flank pain. Skin: Negative for rash. Neurological: Negative for headaches, focal weakness or numbness.  10-point ROS otherwise negative.  ____________________________________________   PHYSICAL EXAM:  VITAL SIGNS: ED Triage Vitals  Enc Vitals Group     BP 03/16/16 1806 183/115 mmHg     Pulse Rate 03/16/16 1806 79     Resp 03/16/16 1806 22     Temp 03/16/16 1806 98.4 F (36.9 C)     Temp Source 03/16/16 1806 Oral     SpO2 03/16/16 1806 100 %     Weight 03/16/16 1806 240 lb (108.863 kg)     Height 03/16/16 1806 6' (1.829 m)     Head Cir --      Peak Flow --      Pain Score 03/16/16 1812 9     Pain Loc --      Pain Edu? --      Excl. in [place]? --     Constitutional: Alert and oriented. In mild distress due to pain. Eyes: Conjunctivae are normal. PERRL. EOMI. Head: Atraumatic. Nose: No congestion/rhinnorhea. Mouth/Throat: Mucous membranes are moist.  Oropharynx non-erythematous. Neck: No stridor.  Supple without meningismus. Cardiovascular: Normal rate, regular rhythm. Grossly normal heart sounds.  Good peripheral circulation. Respiratory: Normal respiratory effort.  No retractions. Lungs CTAB. Gastrointestinal: Soft with mild tenderness in the right mid abdomen in the right lower quadrant. No CVA tenderness. Genitourinary: Circumcised penis, testicles descended bilaterally, minimal tenderness in the right testicle, normal cremasteric reflex. No bulge/hernia sac or tenderness in the right inguinal ring. Musculoskeletal: No lower extremity tenderness nor edema.  No joint effusions. Neurologic:  Normal speech and language. No gross focal neurologic deficits are appreciated. Skin:  Skin is warm, dry and intact.  No rash noted. Psychiatric: Mood and affect are normal. Speech and behavior are normal.  ____________________________________________   LABS (all labs ordered are listed, but only abnormal results are displayed)  Labs Reviewed  URINALYSIS COMPLETEWITH MICROSCOPIC ([facility] ONLY) - Abnormal; Notable for the following:    Color, Urine YELLOW (*)    APPearance CLEAR (*)    Protein, ur 30 (*)    All other components within normal limits  CBC WITH DIFFERENTIAL/PLATELET - Abnormal; Notable for the following:    WBC 12.8 (*)    Neutro Abs 7.2 (*)    Lymphs Abs 4.4 (*)    All other components within normal limits  COMPREHENSIVE METABOLIC PANEL - Abnormal; Notable for the following:    Glucose, Bld 106 (*)    All other components within normal limits   ____________________________________________  EKG  ED ECG REPORT I, Joanne Gavel, the attending physician, personally viewed and interpreted this ECG.   Date: 03/16/2016  EKG Time: 18:13  Rate: 76  Rhythm: normal EKG, normal sinus rhythm  Axis: normal  Intervals:none  ST&T Change: No acute ST elevation.  ____________________________________________  RADIOLOGY  CT renal protocol  US scrotum with dopplers ____________________________________________   PROCEDURES  Procedure(s) performed: None  Critical Care performed: No  ____________________________________________   INITIAL IMPRESSION / ASSESSMENT AND PLAN / ED COURSE  Pertinent labs & imaging results that were available during my care of the patient were reviewed by me and considered in my medical decision making (see chart for details).  Gregory Oconnell is a 50 y.o. male with GERD who presents for evaluation of right flank pain, right lower abdominal pain and right groin/testicle pain gradual onset today. On exam, he is in mild distress due to pain. Vital signs notable for hypertension, initial blood pressure 183/115 however this has improved with pain control.  Current blood pressure 165/98. The remainder of his vital signs are stable, he is afebrile. Clinically, he behaves quite a bit  like he may have nephrolithiasis however his urinalysis does not show classic signs of kidney stone and is not consistent with a urinary tract infection. CMP unremarkable. White blood cell count is elevated at 12.8. Will obtain ultrasound of the scrotum to rule out torsion, we'll obtain CT renal protocol, treat his pain, reassess for disposition.  ----------------------------------------- 8:39 PM on 03/16/2016 ----------------------------------------- Patient with significant improvement of his pain at this time. Awaiting ultrasound and CT results. Care transferred to Dr. Corky Downs at this time. ____________________________________________   FINAL CLINICAL IMPRESSION(S) / ED DIAGNOSES  Final diagnoses:  Pain  Flank pain, acute  Groin pain, right      NEW MEDICATIONS STARTED DURING THIS VISIT:  New Prescriptions   No medications on file     Note:  This document was prepared using Dragon voice recognition software and may include unintentional dictation errors.    Joanne Gavel, MD 03/16/16 2040

## 2016-03-16 NOTE — ED Notes (Signed)
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

## 2016-05-22 ENCOUNTER — Emergency Department: Payer: Medicaid Other

## 2016-05-22 ENCOUNTER — Emergency Department
Admission: EM | Admit: 2016-05-22 | Discharge: 2016-05-22 | Disposition: A | Discharge status: Home / Self Care | Payer: Medicaid Other | Attending: Emergency Medicine | Admitting: Emergency Medicine

## 2016-05-22 ENCOUNTER — Encounter: Payer: Self-pay | Admitting: Emergency Medicine

## 2016-05-22 DIAGNOSIS — Y9301 Activity, walking, marching and hiking: Secondary | ICD-10-CM | POA: Diagnosis not present

## 2016-05-22 DIAGNOSIS — Y929 Unspecified place or not applicable: Secondary | ICD-10-CM | POA: Insufficient documentation

## 2016-05-22 DIAGNOSIS — M25572 Pain in left ankle and joints of left foot: Secondary | ICD-10-CM | POA: Diagnosis present

## 2016-05-22 DIAGNOSIS — S93402A Sprain of unspecified ligament of left ankle, initial encounter: Secondary | ICD-10-CM

## 2016-05-22 DIAGNOSIS — X501XXA Overexertion from prolonged static or awkward postures, initial encounter: Secondary | ICD-10-CM | POA: Insufficient documentation

## 2016-05-22 DIAGNOSIS — Y990 Civilian activity done for income or pay: Secondary | ICD-10-CM | POA: Diagnosis not present

## 2016-05-22 NOTE — ED Triage Notes (Signed)
Twisted left ankle on Wednesday and now having some pain. Pt ambulatory to stat desk. No distress noted.

## 2016-05-22 NOTE — ED Provider Notes (Signed)
[facility] Emergency Department Provider Note  ____________________________________________  Time seen: Approximately 7:25 AM  I have reviewed the triage vital signs and the nursing notes.   HISTORY  Chief Complaint Ankle Pain    HPI Gregory Oconnell is a 50 y.o. male , NAD, presents to the emergency department today history of left ankle pain. States he twisted his left ankle home 2 days ago. Notes some swelling and pain to the area but continued to work and walk on the ankle. States he woke this morning with worsening swelling and pain and decided he would have it checked out. States he has broken his right ankle multiple times but did not seek medical attention due to lack of insurance. Patient states he did attempt to use a walking boot that belonged to his wife which states it helped decrease his ankle pain but did not alleviate symptoms. Denies any numbness, weakness, tingling to the extremity. Has not noted any open wounds or lacerations. Has noted some mild swelling. No blunt trauma to the left lower extremity has occurred.   Past Medical History:  Diagnosis Date  . Arthritis   . GERD (gastroesophageal reflux disease)   . Kidney disease     There are no active problems to display for this patient.   Past Surgical History:  Procedure Laterality Date  . COLONOSCOPY WITH PROPOFOL N/A 02/03/2016   Procedure: COLONOSCOPY WITH PROPOFOL;  Surgeon: Hulen Luster, MD;  Location: [facility] ENDOSCOPY;  Service: Gastroenterology;  Laterality: N/A;  . ESOPHAGOGASTRODUODENOSCOPY (EGD) WITH PROPOFOL N/A 02/03/2016   Procedure: ESOPHAGOGASTRODUODENOSCOPY (EGD) WITH PROPOFOL;  Surgeon: Hulen Luster, MD;  Location: [facility] ENDOSCOPY;  Service: Gastroenterology;  Laterality: N/A;  . KNEE SURGERY      Prior to Admission medications   Medication Sig Start Date End Date Taking? Authorizing Provider  gabapentin (NEURONTIN) 300 MG capsule Take 300 mg by mouth daily.    Historical  Provider, MD  HYDROcodone-acetaminophen (NORCO/VICODIN) 5-325 MG tablet Take 1 tablet by mouth every 4 (four) hours as needed for moderate pain. 03/16/16   Lavonia Drafts, MD  omeprazole (PRILOSEC) 40 MG capsule Take 40 mg by mouth daily.    Historical Provider, MD  promethazine (PHENERGAN) 12.5 MG tablet Take 1 tablet (12.5 mg total) by mouth every 6 (six) hours as needed for nausea or vomiting. 03/16/16   Lavonia Drafts, MD    Allergies Review of patient's allergies indicates no known allergies.  No family history on file.  Social History Social History  Substance Use Topics  . Smoking status: Never Smoker  . Smokeless tobacco: Never Used  . Alcohol use Yes     Review of Systems  Constitutional: No fatigue Cardiovascular: No chest pain. Respiratory:  No shortness of breath.  Musculoskeletal: Positive left ankle pain. Negative for back pain.  Skin: Positive swelling left ankle. Negative for rash or redness, skin sores, open wounds or lacerations. Neurological: Negative for headaches, focal weakness or numbness. No tingling. 10-point ROS otherwise negative.  ____________________________________________   PHYSICAL EXAM:  VITAL SIGNS: ED Triage Vitals [05/22/16 0722]  Enc Vitals Group     BP      Pulse      Resp      Temp      Temp src      SpO2      Weight      Height      Head Circumference      Peak Flow      Pain  Score 6     Pain Loc      Pain Edu?      Excl. in [place]?      Constitutional: Alert and oriented. Well appearing and in no acute distress. Eyes: Conjunctivae are normal.  Head: Atraumatic. Cardiovascular: Good peripheral circulation with 2+ pulses noted in the left lower extremity. Capillary refill is brisk in all digits of the left foot. Respiratory: Normal respiratory effort without tachypnea or retractions.  Musculoskeletal: Tenderness to palpation about the medial and lateral malleolus without bony abnormality or deformities noted. Mild swelling  noted about the medial ankle without any bruising. No laxity with anterior posterior drawer. No laxity with valgus or varus stress. No lower extremity tenderness nor edema.  No joint effusions. Neurologic:  Normal speech and language. No gross focal neurologic deficits are appreciated. Sensation to light touch grossly intact about the left lower extremity Skin:  Skin is warm, dry and intact. No rash, redness, open wounds or lacerations noted. Psychiatric: Mood and affect are normal. Speech and behavior are normal. Patient exhibits appropriate insight and judgement.   ____________________________________________   LABS  None ____________________________________________  EKG  None ____________________________________________  RADIOLOGY I have personally viewed and evaluated these images (plain radiographs) as part of my medical decision making, as well as reviewing the written report by the radiologist.  Dg Ankle Complete Left  Result Date: 05/22/2016 CLINICAL DATA:  Pain following twisting injury 2 days prior EXAM: LEFT ANKLE COMPLETE - 3+ VIEW COMPARISON:  None. FINDINGS: Frontal, oblique, and lateral views were obtained. There are well corticated calcifications in the medial malleolus and dorsal to the distal talus. These areas probably represent prior avulsion type injuries. There is mild soft tissue swelling. No acute appearing fracture is evident. No joint effusion. The ankle mortise appears intact. There is a spur along the inferior calcaneus. IMPRESSION: Findings which appear consistent with prior avulsion type injuries along the medial malleolus and dorsal distal talus. No acute appearing fracture is evident. There is soft tissue swelling. Ankle mortise appears intact. Inferior calcaneal spur present. Electronically Signed   By: Lowella Grip III M.D.   On: 05/22/2016 07:55    ____________________________________________    PROCEDURES  Procedure(s) performed:  None   Procedures   Medications - No data to display   ____________________________________________   INITIAL IMPRESSION / ASSESSMENT AND PLAN / ED COURSE  Pertinent labs & imaging results that were available during my care of the patient were reviewed by me and considered in my medical decision making (see chart for details).  Clinical Course    Patient's diagnosis is consistent with Left ankle sprain. Patient was placed in an Ace wrap and given crutches for comfort care. He was advised to rest the left ankle and ice as well as elevated when not ambulating. Should use the crutches to ambulate over the next couple of days to allow healing. May take over-the-counter ibuprofen or Tylenol as needed for pain. Patient is to follow up with Dr. Roland Rack in orthopedics in 1 week if symptoms persist past this treatment course. Patient is given ED precautions to return to the ED for any worsening or new symptoms.    ____________________________________________  FINAL CLINICAL IMPRESSION(S) / ED DIAGNOSES  Final diagnoses:  Left ankle sprain, initial encounter      NEW MEDICATIONS STARTED DURING THIS VISIT:  New Prescriptions   No medications on file         Braxton Feathers, PA-C 05/22/16 [place]  Paduchowski, MD 05/22/16 1419

## 2016-06-30 ENCOUNTER — Emergency Department
Admission: EM | Admit: 2016-06-30 | Discharge: 2016-06-30 | Disposition: A | Discharge status: Home / Self Care | Payer: Medicaid Other | Attending: Emergency Medicine | Admitting: Emergency Medicine

## 2016-06-30 DIAGNOSIS — K2970 Gastritis, unspecified, without bleeding: Secondary | ICD-10-CM | POA: Diagnosis not present

## 2016-06-30 DIAGNOSIS — F1290 Cannabis use, unspecified, uncomplicated: Secondary | ICD-10-CM | POA: Diagnosis not present

## 2016-06-30 DIAGNOSIS — R1013 Epigastric pain: Secondary | ICD-10-CM | POA: Diagnosis present

## 2016-06-30 LAB — URINALYSIS COMPLETE WITH MICROSCOPIC (ARMC ONLY)
BILIRUBIN URINE: NEGATIVE
Bacteria, UA: NONE SEEN
Glucose, UA: NEGATIVE mg/dL
Hgb urine dipstick: NEGATIVE
LEUKOCYTES UA: NEGATIVE
NITRITE: NEGATIVE
PH: 6 (ref 5.0–8.0)
PROTEIN: 30 mg/dL — AB
SPECIFIC GRAVITY, URINE: 1.024 (ref 1.005–1.030)
Squamous Epithelial / LPF: NONE SEEN

## 2016-06-30 LAB — CBC
HCT: 43.1 % (ref 40.0–52.0)
HEMOGLOBIN: 15.4 g/dL (ref 13.0–18.0)
MCH: 31.3 pg (ref 26.0–34.0)
MCHC: 35.8 g/dL (ref 32.0–36.0)
MCV: 87.5 fL (ref 80.0–100.0)
PLATELETS: 242 10*3/uL (ref 150–440)
RBC: 4.92 MIL/uL (ref 4.40–5.90)
RDW: 13.7 % (ref 11.5–14.5)
WBC: 13.4 10*3/uL — AB (ref 3.8–10.6)

## 2016-06-30 LAB — COMPREHENSIVE METABOLIC PANEL
ALK PHOS: 70 U/L (ref 38–126)
ALT: 25 U/L (ref 17–63)
ANION GAP: 6 (ref 5–15)
AST: 27 U/L (ref 15–41)
Albumin: 4.7 g/dL (ref 3.5–5.0)
BUN: 17 mg/dL (ref 6–20)
CALCIUM: 9.6 mg/dL (ref 8.9–10.3)
CO2: 25 mmol/L (ref 22–32)
CREATININE: 1.06 mg/dL (ref 0.61–1.24)
Chloride: 106 mmol/L (ref 101–111)
Glucose, Bld: 125 mg/dL — ABNORMAL HIGH (ref 65–99)
Potassium: 3.9 mmol/L (ref 3.5–5.1)
SODIUM: 137 mmol/L (ref 135–145)
TOTAL PROTEIN: 8.4 g/dL — AB (ref 6.5–8.1)
Total Bilirubin: 1.5 mg/dL — ABNORMAL HIGH (ref 0.3–1.2)

## 2016-06-30 LAB — LIPASE, BLOOD: Lipase: 21 U/L (ref 11–51)

## 2016-06-30 MED ORDER — GI COCKTAIL ~~LOC~~
30.0000 mL | Freq: Once | ORAL | Status: AC
  Administered 2016-06-30: 30 mL via ORAL
  Filled 2016-06-30: qty 30

## 2016-06-30 MED ORDER — ONDANSETRON HCL 4 MG/2ML IJ SOLN
4.0000 mg | Freq: Once | INTRAMUSCULAR | Status: AC
  Administered 2016-06-30: 4 mg via INTRAVENOUS
  Filled 2016-06-30: qty 2

## 2016-06-30 MED ORDER — OXYCODONE-ACETAMINOPHEN 5-325 MG PO TABS
ORAL_TABLET | ORAL | Status: AC
  Administered 2016-06-30: 1 via ORAL
  Filled 2016-06-30: qty 1

## 2016-06-30 MED ORDER — SODIUM CHLORIDE 0.9 % IV BOLUS (SEPSIS)
1000.0000 mL | Freq: Once | INTRAVENOUS | Status: AC
  Administered 2016-06-30: 1000 mL via INTRAVENOUS

## 2016-06-30 MED ORDER — PROMETHAZINE HCL 12.5 MG PO TABS: 12.5000 mg | ORAL_TABLET | Freq: Four times a day (QID) | ORAL | 0 refills | Status: DC | PRN

## 2016-06-30 MED ORDER — OXYCODONE-ACETAMINOPHEN 5-325 MG PO TABS
1.0000 | ORAL_TABLET | ORAL | Status: DC | PRN
Start: 2016-06-30 — End: 2016-06-30
  Administered 2016-06-30: 1 via ORAL

## 2016-06-30 NOTE — ED Notes (Signed)
Pt comes in w/ c/o abd pain, confirms nausea/vomiting x 5 days. Abdomen soft, bowel sounds active x4 quadrants. Tenderness noted above umbilicus in the middle of abdomen, pain 7/10. Denies diarrhea. Pt states he has tried OTC medication but cannot hold anything down. Pt AOx4, family at bedside.

## 2016-06-30 NOTE — ED Triage Notes (Signed)
Pt reports hx of gastritis and states that he is having the same sx's. Pt reports epigastric abdominal pain since last Wednesday with NV. Pt denies diarrhea. Reports symptoms feel like they do when he has this issue. Pt was seen at [facility] and was given phenergan and told to come to the ED.

## 2016-06-30 NOTE — ED Notes (Signed)
Pt alert and oriented X4, active, cooperative, pt in NAD. RR even and unlabored, color WNL.  Pt informed to return if any life threatening symptoms occur.   

## 2016-06-30 NOTE — ED Provider Notes (Signed)
[facility] Emergency Department Provider Note   ____________________________________________   First MD Initiated Contact with Patient 06/30/16 1159     (approximate)  I have reviewed the triage vital signs and the nursing notes.   HISTORY  Chief Complaint Abdominal Pain; Nausea; and Emesis   HPI Gregory Oconnell is a 50 y.o. male with a history of GERD who is presenting to the emergency department with epigastric abdominal pain over the past 5 days. He says it started this past Friday. He does not know of any trigger for it. He says that he drinks occasionally but not every day does not note any heavy drinking around the time that the pain started. He says that he has a history of gastritis. Most recent endoscopy this past April where he was found to have a Barrett's esophagus.He says that the pain is moderate to severe at this time. He says it feels like someone is punched him in the stomach. He denies any radiation of the pain. Says that he has had recent nausea and vomiting as well and cannot keep anything down. Denies any diarrhea. Says that he has been moving his bowels regularly.   Past Medical History:  Diagnosis Date  . Arthritis   . GERD (gastroesophageal reflux disease)   . Kidney disease     There are no active problems to display for this patient.   Past Surgical History:  Procedure Laterality Date  . COLONOSCOPY WITH PROPOFOL N/A 02/03/2016   Procedure: COLONOSCOPY WITH PROPOFOL;  Surgeon: Hulen Luster, MD;  Location: [facility] ENDOSCOPY;  Service: Gastroenterology;  Laterality: N/A;  . ESOPHAGOGASTRODUODENOSCOPY (EGD) WITH PROPOFOL N/A 02/03/2016   Procedure: ESOPHAGOGASTRODUODENOSCOPY (EGD) WITH PROPOFOL;  Surgeon: Hulen Luster, MD;  Location: [facility] ENDOSCOPY;  Service: Gastroenterology;  Laterality: N/A;  . KNEE SURGERY      Prior to Admission medications   Medication Sig Start Date End Date Taking? Authorizing Provider  gabapentin (NEURONTIN)  300 MG capsule Take 300 mg by mouth daily.    Historical Provider, MD  HYDROcodone-acetaminophen (NORCO/VICODIN) 5-325 MG tablet Take 1 tablet by mouth every 4 (four) hours as needed for moderate pain. 03/16/16   Lavonia Drafts, MD  omeprazole (PRILOSEC) 40 MG capsule Take 40 mg by mouth daily.    Historical Provider, MD  promethazine (PHENERGAN) 12.5 MG tablet Take 1 tablet (12.5 mg total) by mouth every 6 (six) hours as needed for nausea or vomiting. 03/16/16   Lavonia Drafts, MD    Allergies Review of patient's allergies indicates no known allergies.  No family history on file.  Social History Social History  Substance Use Topics  . Smoking status: Never Smoker  . Smokeless tobacco: Never Used  . Alcohol use Yes    Review of Systems Constitutional: No fever/chills Eyes: No visual changes. ENT: No sore throat. Cardiovascular: Denies chest pain. Respiratory: Denies shortness of breath. Gastrointestinal:   No diarrhea.  No constipation. Genitourinary: Negative for dysuria. Musculoskeletal: Negative for back pain. Skin: Negative for rash. Neurological: Negative for headaches, focal weakness or numbness.  10-point ROS otherwise negative.  ____________________________________________   PHYSICAL EXAM:  VITAL SIGNS: ED Triage Vitals [06/30/16 1028]  Enc Vitals Group     BP (!) 179/119     Pulse Rate 68     Resp 20     Temp 98.1 F (36.7 C)     Temp Source Oral     SpO2 99 %     Weight 220 lb (99.8 kg)  Height 6' (1.829 m)     Head Circumference      Peak Flow      Pain Score 7     Pain Loc      Pain Edu?      Excl. in [place]?     Constitutional: Alert and oriented. Appears uncomfortable Eyes: Conjunctivae are normal. PERRL. EOMI. Head: Atraumatic. Nose: No congestion/rhinnorhea. Mouth/Throat: Mucous membranes are moist.   Neck: No stridor.   Cardiovascular: Normal rate, regular rhythm. Grossly normal heart sounds.  Good peripheral circulation. Respiratory:  Normal respiratory effort.  No retractions. Lungs CTAB. Gastrointestinal: Soft with mild epigastric abdominal pain. No distention.  Musculoskeletal: No lower extremity tenderness nor edema.  No joint effusions. Neurologic:  Normal speech and language. No gross focal neurologic deficits are appreciated.  Skin:  Skin is warm, dry and intact. No rash noted. Psychiatric: Mood and affect are normal. Speech and behavior are normal.  ____________________________________________   LABS (all labs ordered are listed, but only abnormal results are displayed)  Labs Reviewed  COMPREHENSIVE METABOLIC PANEL - Abnormal; Notable for the following:       Result Value   Glucose, Bld 125 (*)    Total Protein 8.4 (*)    Total Bilirubin 1.5 (*)    All other components within normal limits  CBC - Abnormal; Notable for the following:    WBC 13.4 (*)    All other components within normal limits  URINALYSIS COMPLETEWITH MICROSCOPIC ([facility] ONLY) - Abnormal; Notable for the following:    Color, Urine YELLOW (*)    APPearance CLEAR (*)    Ketones, ur TRACE (*)    Protein, ur 30 (*)    All other components within normal limits  LIPASE, BLOOD   ____________________________________________  EKG  ED ECG REPORT I, Jonisha Kindig,  Youlanda Roys, the attending physician, personally viewed and interpreted this ECG.   Date: 06/30/2016  EKG Time: 1043  Rate: 65  Rhythm: normal sinus rhythm with sinus arrhythmia  Axis:   Intervals:none  ST&T Change: No ST segment elevation or depression. No abnormal T-wave inversion.  ____________________________________________  RADIOLOGY   ____________________________________________   PROCEDURES  Procedure(s) performed:   Procedures  Critical Care performed:   ____________________________________________   INITIAL IMPRESSION / ASSESSMENT AND PLAN / ED COURSE  Pertinent labs & imaging results that were available during my care of the patient were reviewed by me and  considered in my medical decision making (see chart for details).  ----------------------------------------- 2:01 PM on 06/30/2016 -----------------------------------------  Patient, after fluids, Zofran and GI cocktail is pain-free. I reexamined his abdomen and he has non-tender diffusely. Likely gastritis with reflux. Patient will continue taking omeprazole. I advised him to take Maalox for breakthrough pain. He'll be following up with gastroenterology. He understands the plan and is willing to comply.  Clinical Course     ____________________________________________   FINAL CLINICAL IMPRESSION(S) / ED DIAGNOSES  Gastritis.    NEW MEDICATIONS STARTED DURING THIS VISIT:  New Prescriptions   No medications on file     Note:  This document was prepared using Dragon voice recognition software and may include unintentional dictation errors.    Orbie Pyo, MD 06/30/16 [phone]

## 2016-10-15 ENCOUNTER — Other Ambulatory Visit: Payer: Self-pay | Admitting: Gastroenterology

## 2016-10-15 DIAGNOSIS — R1011 Right upper quadrant pain: Secondary | ICD-10-CM

## 2016-10-22 ENCOUNTER — Ambulatory Visit: Payer: Medicaid Other

## 2016-12-02 ENCOUNTER — Encounter
Admission: EM | Disposition: A | Discharge status: Home / Self Care | Payer: Self-pay | Source: Home / Self Care | Attending: Emergency Medicine

## 2016-12-02 ENCOUNTER — Encounter: Payer: Self-pay | Admitting: Medical Oncology

## 2016-12-02 ENCOUNTER — Emergency Department
Admission: EM | Admit: 2016-12-02 | Discharge: 2016-12-02 | Disposition: A | Discharge status: Home / Self Care | Payer: Medicaid Other | Attending: Emergency Medicine | Admitting: Emergency Medicine

## 2016-12-02 ENCOUNTER — Emergency Department: Payer: Medicaid Other

## 2016-12-02 DIAGNOSIS — Z79899 Other long term (current) drug therapy: Secondary | ICD-10-CM | POA: Diagnosis not present

## 2016-12-02 DIAGNOSIS — M25471 Effusion, right ankle: Secondary | ICD-10-CM | POA: Diagnosis not present

## 2016-12-02 DIAGNOSIS — F1290 Cannabis use, unspecified, uncomplicated: Secondary | ICD-10-CM | POA: Insufficient documentation

## 2016-12-02 DIAGNOSIS — M25571 Pain in right ankle and joints of right foot: Secondary | ICD-10-CM | POA: Diagnosis present

## 2016-12-02 DIAGNOSIS — R112 Nausea with vomiting, unspecified: Secondary | ICD-10-CM | POA: Insufficient documentation

## 2016-12-02 DIAGNOSIS — R1010 Upper abdominal pain, unspecified: Secondary | ICD-10-CM | POA: Insufficient documentation

## 2016-12-02 LAB — COMPREHENSIVE METABOLIC PANEL
ALT: 19 U/L (ref 17–63)
AST: 20 U/L (ref 15–41)
Albumin: 4.5 g/dL (ref 3.5–5.0)
Alkaline Phosphatase: 80 U/L (ref 38–126)
Anion gap: 10 (ref 5–15)
BILIRUBIN TOTAL: 1.7 mg/dL — AB (ref 0.3–1.2)
BUN: 18 mg/dL (ref 6–20)
CO2: 23 mmol/L (ref 22–32)
CREATININE: 1.13 mg/dL (ref 0.61–1.24)
Calcium: 9.2 mg/dL (ref 8.9–10.3)
Chloride: 103 mmol/L (ref 101–111)
GFR calc Af Amer: >60 mL/min (ref >60)
Glucose, Bld: 132 mg/dL — ABNORMAL HIGH (ref 65–99)
POTASSIUM: 3.8 mmol/L (ref 3.5–5.1)
Sodium: 136 mmol/L (ref 135–145)
TOTAL PROTEIN: 8.1 g/dL (ref 6.5–8.1)

## 2016-12-02 LAB — URINALYSIS, COMPLETE (UACMP) WITH MICROSCOPIC
Bacteria, UA: NONE SEEN
Bilirubin Urine: NEGATIVE
Glucose, UA: NEGATIVE mg/dL
HGB URINE DIPSTICK: NEGATIVE
Ketones, ur: NEGATIVE mg/dL
LEUKOCYTES UA: NEGATIVE
NITRITE: NEGATIVE
PH: 6 (ref 5.0–8.0)
Protein, ur: NEGATIVE mg/dL
Specific Gravity, Urine: 1.028 (ref 1.005–1.030)

## 2016-12-02 LAB — SYNOVIAL CELL COUNT + DIFF, W/ CRYSTALS
Crystals, Fluid: NONE SEEN
EOSINOPHILS-SYNOVIAL: 0 %
LYMPHOCYTES-SYNOVIAL FLD: 8 %
Monocyte-Macrophage-Synovial Fluid: 11 %
Neutrophil, Synovial: 81 %
OTHER CELLS-SYN: 0
WBC, Synovial: 85001 /mm3 — ABNORMAL HIGH (ref 0–200)

## 2016-12-02 LAB — CBC WITH DIFFERENTIAL/PLATELET
BASOS ABS: 0 10*3/uL (ref 0–0.1)
Basophils Relative: 0 %
EOS ABS: 0 10*3/uL (ref 0–0.7)
EOS PCT: 0 %
HCT: 43.2 % (ref 40.0–52.0)
Hemoglobin: 14.5 g/dL (ref 13.0–18.0)
Lymphocytes Relative: 18 %
Lymphs Abs: 3 10*3/uL (ref 1.0–3.6)
MCH: 30.3 pg (ref 26.0–34.0)
MCHC: 33.6 g/dL (ref 32.0–36.0)
MCV: 90 fL (ref 80.0–100.0)
MONO ABS: 1.6 10*3/uL — AB (ref 0.2–1.0)
Monocytes Relative: 9 %
Neutro Abs: 12.3 10*3/uL — ABNORMAL HIGH (ref 1.4–6.5)
Neutrophils Relative %: 73 %
PLATELETS: 222 10*3/uL (ref 150–440)
RBC: 4.81 MIL/uL (ref 4.40–5.90)
RDW: 13.7 % (ref 11.5–14.5)
WBC: 16.9 10*3/uL — AB (ref 3.8–10.6)

## 2016-12-02 LAB — CBC
HCT: 46.1 % (ref 40.0–52.0)
Hemoglobin: 15.9 g/dL (ref 13.0–18.0)
MCH: 30.6 pg (ref 26.0–34.0)
MCHC: 34.5 g/dL (ref 32.0–36.0)
MCV: 88.8 fL (ref 80.0–100.0)
Platelets: 269 10*3/uL (ref 150–440)
RBC: 5.19 MIL/uL (ref 4.40–5.90)
RDW: 13.5 % (ref 11.5–14.5)
WBC: 17.2 10*3/uL — AB (ref 3.8–10.6)

## 2016-12-02 LAB — LIPASE, BLOOD: Lipase: 24 U/L (ref 11–51)

## 2016-12-02 LAB — URIC ACID: URIC ACID, SERUM: 6.6 mg/dL (ref 4.4–7.6)

## 2016-12-02 SURGERY — ARTHROSCOPY, ANKLE
Anesthesia: Choice | Laterality: Right

## 2016-12-02 MED ORDER — LIDOCAINE HCL (PF) 1 % IJ SOLN
5.0000 mL | Freq: Once | INTRAMUSCULAR | Status: AC
  Administered 2016-12-02: 5 mL via INTRADERMAL
  Filled 2016-12-02: qty 5

## 2016-12-02 MED ORDER — ONDANSETRON HCL 4 MG/2ML IJ SOLN
4.0000 mg | Freq: Once | INTRAMUSCULAR | Status: AC
  Administered 2016-12-02: 4 mg via INTRAVENOUS
  Filled 2016-12-02: qty 2

## 2016-12-02 MED ORDER — PREDNISONE 20 MG PO TABS
60.0000 mg | ORAL_TABLET | Freq: Once | ORAL | Status: AC
  Administered 2016-12-02: 60 mg via ORAL
  Filled 2016-12-02: qty 3

## 2016-12-02 MED ORDER — COLCHICINE 0.6 MG PO TABS: 0.6000 mg | ORAL_TABLET | Freq: Two times a day (BID) | ORAL | 0 refills | Status: DC | PRN

## 2016-12-02 MED ORDER — OXYCODONE-ACETAMINOPHEN 5-325 MG PO TABS: 1.0000 | ORAL_TABLET | Freq: Four times a day (QID) | ORAL | 0 refills | Status: DC | PRN

## 2016-12-02 MED ORDER — FAMOTIDINE IN NACL 20-0.9 MG/50ML-% IV SOLN
20.0000 mg | Freq: Once | INTRAVENOUS | Status: AC
  Administered 2016-12-02: 20 mg via INTRAVENOUS
  Filled 2016-12-02: qty 50

## 2016-12-02 MED ORDER — SODIUM CHLORIDE 0.9 % IV BOLUS (SEPSIS)
1000.0000 mL | Freq: Once | INTRAVENOUS | Status: AC
  Administered 2016-12-02: 1000 mL via INTRAVENOUS

## 2016-12-02 MED ORDER — METHYLPREDNISOLONE 4 MG PO TBPK: ORAL_TABLET | Freq: Every day | ORAL | 0 refills | Status: DC

## 2016-12-02 MED ORDER — COLCHICINE 0.6 MG PO TABS
0.6000 mg | ORAL_TABLET | Freq: Once | ORAL | Status: AC
  Administered 2016-12-02: 0.6 mg via ORAL
  Filled 2016-12-02: qty 1

## 2016-12-02 MED ORDER — HYDROMORPHONE HCL 1 MG/ML IJ SOLN
1.0000 mg | Freq: Once | INTRAMUSCULAR | Status: AC
  Administered 2016-12-02: 1 mg via INTRAVENOUS
  Filled 2016-12-02: qty 1

## 2016-12-02 MED ORDER — FAMOTIDINE 20 MG PO TABS: 20.0000 mg | ORAL_TABLET | Freq: Two times a day (BID) | ORAL | 0 refills | Status: DC

## 2016-12-02 MED ORDER — ONDANSETRON 4 MG PO TBDP: 4.0000 mg | ORAL_TABLET | Freq: Three times a day (TID) | ORAL | 0 refills | Status: DC | PRN

## 2016-12-02 MED ORDER — VANCOMYCIN HCL 10 G IV SOLR
1500.0000 mg | Freq: Once | INTRAVENOUS | Status: AC
  Administered 2016-12-02: 1500 mg via INTRAVENOUS
  Filled 2016-12-02: qty 1500

## 2016-12-02 SURGICAL SUPPLY — 37 items
ADAPTER IRRIG TUBE 2 SPIKE SOL (ADAPTER) ×3 IMPLANT
BANDAGE ELASTIC 4 LF NS (GAUZE/BANDAGES/DRESSINGS) ×6 IMPLANT
CASTING MATERIAL DELTA LITE (CAST SUPPLIES) ×3 IMPLANT
CHLORAPREP W/TINT 26ML (MISCELLANEOUS) ×6 IMPLANT
CLOSURE WOUND 1/2 X4 (GAUZE/BANDAGES/DRESSINGS) ×1
CUFF TOURN 24 STER (MISCELLANEOUS) IMPLANT
CUFF TOURN 30 STER DUAL PORT (MISCELLANEOUS) IMPLANT
DRAPE INCISE IOBAN 66X45 STRL (DRAPES) ×3 IMPLANT
GAUZE PETRO XEROFOAM 1X8 (MISCELLANEOUS) ×3 IMPLANT
GAUZE SPONGE 4X4 12PLY STRL (GAUZE/BANDAGES/DRESSINGS) ×3 IMPLANT
GLOVE BIO SURGEON STRL SZ8 (GLOVE) ×6 IMPLANT
GLOVE INDICATOR 8.0 STRL GRN (GLOVE) ×3 IMPLANT
GOWN STRL REUS W/ TWL LRG LVL3 (GOWN DISPOSABLE) ×1 IMPLANT
GOWN STRL REUS W/ TWL XL LVL3 (GOWN DISPOSABLE) ×1 IMPLANT
GOWN STRL REUS W/TWL LRG LVL3 (GOWN DISPOSABLE) ×2
GOWN STRL REUS W/TWL XL LVL3 (GOWN DISPOSABLE) ×2
IV LACTATED RINGER IRRG 3000ML (IV SOLUTION) ×6
IV LR IRRIG 3000ML ARTHROMATIC (IV SOLUTION) ×3 IMPLANT
KIT RM TURNOVER STRD PROC AR (KITS) ×3 IMPLANT
LABEL OR SOLS (LABEL) ×3 IMPLANT
MANIFOLD NEPTUNE II (INSTRUMENTS) ×3 IMPLANT
NS IRRIG 500ML POUR BTL (IV SOLUTION) ×3 IMPLANT
PACK ARTHROSCOPY KNEE (MISCELLANEOUS) ×3 IMPLANT
PAD CAST CTTN 4X4 STRL (SOFTGOODS) ×3 IMPLANT
PADDING CAST COTTON 4X4 STRL (SOFTGOODS) ×6
STRAP ANKLE FOOT DISTRACTOR (ORTHOPEDIC SUPPLIES) ×3 IMPLANT
STRAP SAFETY BODY (MISCELLANEOUS) ×3 IMPLANT
STRIP CLOSURE SKIN 1/2X4 (GAUZE/BANDAGES/DRESSINGS) ×2 IMPLANT
SUCTION FRAZIER HANDLE 10FR (MISCELLANEOUS) ×2
SUCTION TUBE FRAZIER 10FR DISP (MISCELLANEOUS) ×1 IMPLANT
SUT ETHIBOND GREEN BRAID 0S 4 (SUTURE) ×3 IMPLANT
SUT ETHIBOND NAB CT1 #1 30IN (SUTURE) ×3 IMPLANT
SUT ORTHOCORD 2X36 W/O NDL (SUTURE) ×3 IMPLANT
SUT ORTHOCORD W/MULTIPK NDL (SUTURE) ×3 IMPLANT
SUT PROLENE 4 0 PS 2 18 (SUTURE) ×3 IMPLANT
SUT VIC AB 2-0 CT2 27 (SUTURE) ×3 IMPLANT
TUBING ARTHRO INFLOW-ONLY STRL (TUBING) ×3 IMPLANT

## 2016-12-02 NOTE — ED Notes (Signed)
Pt given urinal per request. NAD noted. Delay explained. Will continue to monitor for further patient needs.

## 2016-12-02 NOTE — ED Notes (Signed)
Pt visualized in NAD at this time. Pt's wife at bedside at this time. Denies any further needs. Will continue to monitor for further patient needs at this time.

## 2016-12-02 NOTE — ED Notes (Signed)
Pt given 2 blankets per his request. Pt is alert and oriented, wife remains at bedside at this time. Will continue to monitor for further patient needs.

## 2016-12-02 NOTE — ED Notes (Signed)
Pt resing in bed, NAD noted at this time. Supplies arthrocentesis placed at bedside per MD request. Pt denies any further needs, delay explained, pt states understanding.

## 2016-12-02 NOTE — ED Notes (Signed)
This RN to bedside at this time. NAD noted at this time. Pt states "I feel much better than I did earlier". Denies further needs. Will continue to monitor for further patient needs.

## 2016-12-02 NOTE — ED Notes (Signed)
Pt noted to become diaphoretic and pale during procedure. Dr. Joni Fears notified and at bedside. Pt instructed to take slow deep breaths and given a cool washcloth on his neck. Will continue to monitor for further patient needs.

## 2016-12-02 NOTE — ED Triage Notes (Signed)
Per pt he has been having vomiting and nausea since Thursday, yesterday he began having diarrhea. Pt reports also pain to his rt foot.

## 2016-12-02 NOTE — ED Notes (Signed)
Pt returned from Xray at this time  

## 2016-12-02 NOTE — ED Notes (Signed)
Pt states he is feeling much better, his wife remains at bedside at this time. Will continue to monitor for further patient needs.

## 2016-12-02 NOTE — ED Provider Notes (Signed)
[facility] Emergency Department Provider Note  ____________________________________________  Time seen: Approximately 4:17 PM  I have reviewed the triage vital signs and the nursing notes.   HISTORY  Chief Complaint Emesis and Diarrhea    HPI Gregory Oconnell is a 51 y.o. male who complains of nausea and vomiting reports that it is due to recurrent gastritis that he gets time to time. He also complains of right ankle pain and swelling for the past 2 days. Unable to bear weight on it due to the pain. Reports that he's had previous fractures of the ankle, but no recent injury. No sprain or other reason for the pain and swelling tumor. Denies ever having any gout or other joint swelling in the past. No fever or chills.     Past Medical History:  Diagnosis Date  . Arthritis   . GERD (gastroesophageal reflux disease)   . Kidney disease      There are no active problems to display for this patient.    Past Surgical History:  Procedure Laterality Date  . COLONOSCOPY WITH PROPOFOL N/A 02/03/2016   Procedure: COLONOSCOPY WITH PROPOFOL;  Surgeon: Hulen Luster, MD;  Location: [facility] ENDOSCOPY;  Service: Gastroenterology;  Laterality: N/A;  . ESOPHAGOGASTRODUODENOSCOPY (EGD) WITH PROPOFOL N/A 02/03/2016   Procedure: ESOPHAGOGASTRODUODENOSCOPY (EGD) WITH PROPOFOL;  Surgeon: Hulen Luster, MD;  Location: [facility] ENDOSCOPY;  Service: Gastroenterology;  Laterality: N/A;  . KNEE SURGERY       Prior to Admission medications   Medication Sig Start Date End Date Taking? Authorizing Provider  colchicine 0.6 MG tablet Take 1 tablet (0.6 mg total) by mouth 2 (two) times daily as needed. 12/02/16 12/02/17  Carrie Mew, MD  famotidine (PEPCID) 20 MG tablet Take 1 tablet (20 mg total) by mouth 2 (two) times daily. 12/02/16   Carrie Mew, MD  gabapentin (NEURONTIN) 300 MG capsule Take 300 mg by mouth daily.    Historical Provider, MD  HYDROcodone-acetaminophen (NORCO/VICODIN)  5-325 MG tablet Take 1 tablet by mouth every 4 (four) hours as needed for moderate pain. 03/16/16   Lavonia Drafts, MD  methylPREDNISolone (MEDROL) 4 MG TBPK tablet Take by mouth daily. Day 1: 6 tablets Day 2: 5 tablets Day 3: 4 tablets Day 4: 3 tablets Day 5: 2 tablets Day 6: 1 tablet 12/02/16   Carrie Mew, MD  omeprazole (PRILOSEC) 40 MG capsule Take 40 mg by mouth daily.    Historical Provider, MD  ondansetron (ZOFRAN ODT) 4 MG disintegrating tablet Take 1 tablet (4 mg total) by mouth every 8 (eight) hours as needed for nausea or vomiting. 12/02/16   Carrie Mew, MD  oxyCODONE-acetaminophen (ROXICET) 5-325 MG tablet Take 1 tablet by mouth every 6 (six) hours as needed for severe pain. 12/02/16   Carrie Mew, MD  promethazine (PHENERGAN) 12.5 MG tablet Take 1 tablet (12.5 mg total) by mouth every 6 (six) hours as needed for nausea or vomiting. 06/30/16   Orbie Pyo, MD     Allergies Patient has no known allergies.   No family history on file.  Social History Social History  Substance Use Topics  . Smoking status: Never Smoker  . Smokeless tobacco: Never Used  . Alcohol use Yes    Review of Systems  Constitutional:   No fever or chills.  ENT:   No sore throat. No rhinorrhea. Cardiovascular:   No chest pain. Respiratory:   No dyspnea or cough. Gastrointestinal:   Positive upper abdominal pain or vomiting. No diarrhea  or constipation.  Genitourinary:   Negative for dysuria or difficulty urinating. Musculoskeletal:   Positive right ankle pain and swelling Neurological:   Negative for headaches 10-point ROS otherwise negative.  ____________________________________________   PHYSICAL EXAM:  VITAL SIGNS: ED Triage Vitals [12/02/16 0951]  Enc Vitals Group     BP (!) 151/100     Pulse Rate 93     Resp 18     Temp 98 F (36.7 C)     Temp Source Oral     SpO2 99 %     Weight 210 lb (95.3 kg)     Height 6' (1.829 m)     Head Circumference       Peak Flow      Pain Score 8     Pain Loc      Pain Edu?      Excl. in [place]?     Vital signs reviewed, nursing assessments reviewed.   Constitutional:   Alert and oriented. Well appearing and in no distress. Eyes:   No scleral icterus. No conjunctival pallor. PERRL. EOMI.  No nystagmus. ENT   Head:   Normocephalic and atraumatic.   Nose:   No congestion/rhinnorhea. No septal hematoma   Mouth/Throat:   MMM, no pharyngeal erythema. No peritonsillar mass.    Neck:   No stridor. No SubQ emphysema. No meningismus. Hematological/Lymphatic/Immunilogical:   No cervical lymphadenopathy. Cardiovascular:   RRR. Symmetric bilateral radial and DP pulses.  No murmurs.  Respiratory:   Normal respiratory effort without tachypnea nor retractions. Breath sounds are clear and equal bilaterally. No wheezes/rales/rhonchi. Gastrointestinal:   Soft and nontender. Non distended. There is no CVA tenderness.  No rebound, rigidity, or guarding. Genitourinary:   deferred Musculoskeletal:   Tenderness diffusely about the right ankle with joint effusion. There is pain with passive range of motion of the right ankle. No inflammatory skin changes. Extremities otherwise unremarkable with normal range of motion in all other joints.. Neurologic:   Normal speech and language.  CN 2-10 normal. Motor grossly intact. No gross focal neurologic deficits are appreciated.  Skin:    Skin is warm, dry and intact. No rash noted.  No petechiae, purpura, or bullae.  ____________________________________________    LABS (pertinent positives/negatives) (all labs ordered are listed, but only abnormal results are displayed) Labs Reviewed  COMPREHENSIVE METABOLIC PANEL - Abnormal; Notable for the following:       Result Value   Glucose, Bld 132 (*)    Total Bilirubin 1.7 (*)    All other components within normal limits  CBC - Abnormal; Notable for the following:    WBC 17.2 (*)    All other components within normal  limits  URINALYSIS, COMPLETE (UACMP) WITH MICROSCOPIC - Abnormal; Notable for the following:    Color, Urine YELLOW (*)    APPearance CLEAR (*)    Squamous Epithelial / LPF 0-5 (*)    All other components within normal limits  SYNOVIAL CELL COUNT + DIFF, W/ CRYSTALS - Abnormal; Notable for the following:    Color, Synovial YELLOW (*)    Appearance-Synovial CLOUDY (*)    WBC, Synovial 85,001 (*)    All other components within normal limits  CBC WITH DIFFERENTIAL/PLATELET - Abnormal; Notable for the following:    WBC 16.9 (*)    Neutro Abs 12.3 (*)    Monocytes Absolute 1.6 (*)    All other components within normal limits  BODY FLUID CULTURE  GRAM STAIN  LIPASE, BLOOD  URIC ACID  GLUCOSE, SYNOVIAL FLUID  PROTEIN, SYNOVIAL FLUID  URIC ACID, SYNOVIAL FLUID   ____________________________________________   EKG    ____________________________________________    RADIOLOGY  X-ray lumbar spine unremarkable X-ray right ankle unremarkable  ____________________________________________   PROCEDURES Procedures ARTHOCENTESIS Performed by: Joni Fears, Fortino Haag Consent: Verbal consent obtained. Risks and benefits: risks, benefits and alternatives were discussed Consent given by: patient Required items: required blood products, implants, devices, and special equipment available Patient identity confirmed: verbally with patient Time out: Immediately prior to procedure a "time out" was called to verify the correct patient, procedure, equipment, support staff and site/side marked as required. Indications: Diagnosis of possible septic arthritis  Joint: Right ankle Local anesthesia used: 1% lidocaine without epinephrine  Preparation: Patient was prepped and draped in the usual sterile fashion. Aspirate appearance: Cloudy straw-colored  Aspirate amount: 15 ml Patient tolerance: Patient tolerated the procedure well but developed nausea and lightheadedness and diaphoresis upon seeing the  synovial fluid being aspirated out. Consistent with a vagal reaction. Remained hemodynamically stable throughout, and symptoms resolved within 1-2 minutes. .    ____________________________________________   INITIAL IMPRESSION / ASSESSMENT AND PLAN / ED COURSE  Pertinent labs & imaging results that were available during my care of the patient were reviewed by me and considered in my medical decision making (see chart for details).  Patient presents with upper abdominal pain nausea vomiting, controlled with medications. This is a recurrent issue for him. Vital signs and labs are unremarkable. Exam is reassuring.  Also presents with monoarticular arthritis of the right ankle for which she denies any previous episodes or history. Possibly related to his multiple previous injuries about that joint, but with painful range of motion and leukocytosis and no other exhalation a workup, we will need to complete a arthrocentesis to ensure he does not have septic arthritis.     Clinical Course as of Dec 02 1933  Wed Dec 02, 2016  1535 Arthrocentesis complete  [PS]  1653 Still awaiting synovial labs. Pt comfortable.   [PS]  1736 Synovial wbc 85000, 81% neutrophils. Gram stain neg. Start iv vancomycin, will d/w orthopedics.   [PS]  80 D/w Dr. Roland Rack.  Given prolonged ED course without fever and equivocal labs, will check cbc diff, serum uric acid, repeat VS.  OR unavailable until 8pm at earliest.   [PS]  1906 Repeat VS, labs, unchanged. Afebrile. D/w Poggi, plan to tx as gout, medrol dose pack, colchicine, f/u clinic 2 days. Return precautions given for any new/worse sx or fever.   [PS]    Clinical Course User Index [PS] Carrie Mew, MD    ----------------------------------------- 7:35 PM on 12/02/2016 -----------------------------------------  Will complete vancomycin infusion and then discharged. ____________________________________________   FINAL CLINICAL IMPRESSION(S) / ED  DIAGNOSES  Final diagnoses:  Ankle effusion, right  Acute right ankle pain      New Prescriptions   COLCHICINE 0.6 MG TABLET    Take 1 tablet (0.6 mg total) by mouth 2 (two) times daily as needed.   FAMOTIDINE (PEPCID) 20 MG TABLET    Take 1 tablet (20 mg total) by mouth 2 (two) times daily.   METHYLPREDNISOLONE (MEDROL) 4 MG TBPK TABLET    Take by mouth daily. Day 1: 6 tablets Day 2: 5 tablets Day 3: 4 tablets Day 4: 3 tablets Day 5: 2 tablets Day 6: 1 tablet   ONDANSETRON (ZOFRAN ODT) 4 MG DISINTEGRATING TABLET    Take 1 tablet (4 mg total) by mouth every 8 (eight) hours as needed  for nausea or vomiting.   OXYCODONE-ACETAMINOPHEN (ROXICET) 5-325 MG TABLET    Take 1 tablet by mouth every 6 (six) hours as needed for severe pain.     Portions of this note were generated with dragon dictation software. Dictation errors may occur despite best attempts at proofreading.    Carrie Mew, MD 12/02/16 [phone]

## 2016-12-02 NOTE — ED Notes (Signed)
NAD noted at this time. Pt resting in bed, this RN at bedside to give medications at this time. Pt denies any needs. Will continue to monitor for further patient needs.

## 2016-12-02 NOTE — ED Notes (Signed)
This RN and Dr. Joni Fears at bedside for procedure at this time.

## 2016-12-02 NOTE — ED Notes (Signed)
Pt. Verbalizes understanding of d/c instructions, prescriptions, and follow-up. VS stable.Pt. In NAD at time of d/c and denies further concerns regarding this visit. Pt. Stable at the time of departure from the unit, departing unit by the safest and most appropriate manner per that pt condition and limitations. Pt advised to return to the ED at any time for emergent concerns, or for new/worsening symptoms.   

## 2016-12-06 LAB — BODY FLUID CULTURE
CULTURE: NO GROWTH
SPECIAL REQUESTS: NORMAL

## 2017-09-05 ENCOUNTER — Emergency Department: Payer: Medicaid Other

## 2017-09-05 ENCOUNTER — Other Ambulatory Visit: Payer: Self-pay

## 2017-09-05 ENCOUNTER — Encounter: Payer: Self-pay | Admitting: *Deleted

## 2017-09-05 ENCOUNTER — Emergency Department
Admission: EM | Admit: 2017-09-05 | Discharge: 2017-09-05 | Disposition: A | Discharge status: Home / Self Care | Payer: Medicaid Other | Attending: Emergency Medicine | Admitting: Emergency Medicine

## 2017-09-05 DIAGNOSIS — S39012A Strain of muscle, fascia and tendon of lower back, initial encounter: Secondary | ICD-10-CM | POA: Diagnosis not present

## 2017-09-05 DIAGNOSIS — Y93H2 Activity, gardening and landscaping: Secondary | ICD-10-CM | POA: Diagnosis not present

## 2017-09-05 DIAGNOSIS — Y929 Unspecified place or not applicable: Secondary | ICD-10-CM | POA: Insufficient documentation

## 2017-09-05 DIAGNOSIS — M5431 Sciatica, right side: Secondary | ICD-10-CM | POA: Diagnosis not present

## 2017-09-05 DIAGNOSIS — X500XXA Overexertion from strenuous movement or load, initial encounter: Secondary | ICD-10-CM | POA: Diagnosis not present

## 2017-09-05 DIAGNOSIS — Y999 Unspecified external cause status: Secondary | ICD-10-CM | POA: Insufficient documentation

## 2017-09-05 DIAGNOSIS — M62830 Muscle spasm of back: Secondary | ICD-10-CM | POA: Insufficient documentation

## 2017-09-05 DIAGNOSIS — Z79899 Other long term (current) drug therapy: Secondary | ICD-10-CM | POA: Insufficient documentation

## 2017-09-05 DIAGNOSIS — S3992XA Unspecified injury of lower back, initial encounter: Secondary | ICD-10-CM | POA: Diagnosis present

## 2017-09-05 DIAGNOSIS — M5432 Sciatica, left side: Secondary | ICD-10-CM | POA: Diagnosis not present

## 2017-09-05 MED ORDER — ORPHENADRINE CITRATE 30 MG/ML IJ SOLN
60.0000 mg | Freq: Once | INTRAMUSCULAR | Status: AC
  Administered 2017-09-05: 60 mg via INTRAMUSCULAR
  Filled 2017-09-05: qty 2

## 2017-09-05 MED ORDER — PREDNISONE 10 MG (21) PO TBPK: ORAL_TABLET | ORAL | 0 refills | Status: DC

## 2017-09-05 MED ORDER — ORPHENADRINE CITRATE ER 100 MG PO TB12: 100.0000 mg | ORAL_TABLET | Freq: Two times a day (BID) | ORAL | 0 refills | Status: AC

## 2017-09-05 MED ORDER — DEXAMETHASONE SODIUM PHOSPHATE 10 MG/ML IJ SOLN
10.0000 mg | Freq: Once | INTRAMUSCULAR | Status: AC
  Administered 2017-09-05: 10 mg via INTRAMUSCULAR
  Filled 2017-09-05: qty 1

## 2017-09-05 NOTE — Discharge Instructions (Addendum)
Take medication as prescribed.   Return to emergency department if symptoms significantly worsen or follow-up with PCP as needed.

## 2017-09-05 NOTE — ED Triage Notes (Signed)
PT to ED reporting lower back pain radiating down legs. Pt has hx of sciatica and reports he was working in the yard lifting heavy things and a couple days later pt reports he noticed the pain had worsened.

## 2017-09-05 NOTE — ED Provider Notes (Signed)
[facility] Emergency Department Provider Note   ____________________________________________   I have reviewed the triage vital signs and the nursing notes.   HISTORY  Chief Complaint Back Pain    HPI Gregory Oconnell is a 51 y.o. male presents to the emergency room with lumbar back pain with pain radiating down bilateral lower extremities.  Patient described working in his yard involving lifting twisting and bending while doing yard work.  Patient has a history of chronic back pain and after the above activities, he noted increased back pain with radiating pain down both extremities.  Patient denies any changes in sensation or loss of lower extremity strength.  Patient denies any change in bowel or bladder function or saddle anesthesia.  Patient denies any recent fall or significant traumatic injury. Patient has been using axillary crutches for mobility since onset of the above symptoms. Patient denies fever, chills, headache, vision changes, chest pain, chest tightness, shortness of breath, abdominal pain, nausea and vomiting.  Past Medical History:  Diagnosis Date  . Arthritis   . GERD (gastroesophageal reflux disease)   . Kidney disease     There are no active problems to display for this patient.   Past Surgical History:  Procedure Laterality Date  . ANKLE ARTHROSCOPY Right 12/02/2016   Performed by Corky Mull, MD at [facility] ORS  . COLONOSCOPY WITH PROPOFOL N/A 02/03/2016   Performed by Hulen Luster, MD at [place]  . ESOPHAGOGASTRODUODENOSCOPY (EGD) WITH PROPOFOL N/A 02/03/2016   Performed by Hulen Luster, MD at [place]  . KNEE SURGERY      Prior to Admission medications   Medication Sig Start Date End Date Taking? Authorizing Provider  colchicine 0.6 MG tablet Take 1 tablet (0.6 mg total) by mouth 2 (two) times daily as needed. 12/02/16 12/02/17  Carrie Mew, MD  famotidine (PEPCID) 20 MG tablet Take 1 tablet (20 mg total) by  mouth 2 (two) times daily. 12/02/16   Carrie Mew, MD  gabapentin (NEURONTIN) 300 MG capsule Take 300 mg by mouth daily.    [provider]  HYDROcodone-acetaminophen (NORCO/VICODIN) 5-325 MG tablet Take 1 tablet by mouth every 4 (four) hours as needed for moderate pain. 03/16/16   Lavonia Drafts, MD  methylPREDNISolone (MEDROL) 4 MG TBPK tablet Take by mouth daily. Day 1: 6 tablets Day 2: 5 tablets Day 3: 4 tablets Day 4: 3 tablets Day 5: 2 tablets Day 6: 1 tablet 12/02/16   Carrie Mew, MD  omeprazole (PRILOSEC) 40 MG capsule Take 40 mg by mouth daily.    [provider]  ondansetron (ZOFRAN ODT) 4 MG disintegrating tablet Take 1 tablet (4 mg total) by mouth every 8 (eight) hours as needed for nausea or vomiting. 12/02/16   Carrie Mew, MD  orphenadrine (NORFLEX) 100 MG tablet Take 1 tablet (100 mg total) 2 (two) times daily for 15 days by mouth. 09/05/17 09/20/17  Harlea Goetzinger M, PA-C  oxyCODONE-acetaminophen (ROXICET) 5-325 MG tablet Take 1 tablet by mouth every 6 (six) hours as needed for severe pain. 12/02/16   Carrie Mew, MD  predniSONE (STERAPRED UNI-PAK 21 TAB) 10 MG (21) TBPK tablet Take 6 tablets on day 1. Take 5 tablets on day 2. Take 4 tablets on day 3. Take 3 tablets on day 4. Take 2 tablets on day 5. Take 1 tablets on day 6. 09/05/17   Katisha Shimizu M, PA-C  promethazine (PHENERGAN) 12.5 MG tablet Take 1 tablet (12.5 mg total) by  mouth every 6 (six) hours as needed for nausea or vomiting. 06/30/16   Schaevitz, Randall An, MD    Allergies Patient has no known allergies.  History reviewed. No pertinent family history.  Social History Social History   Tobacco Use  . Smoking status: Never Smoker  . Smokeless tobacco: Never Used  Substance Use Topics  . Alcohol use: Yes  . Drug use: Yes    Types: Marijuana    Review of Systems Constitutional: Negative for fever/chills Eyes: No visual changes. Cardiovascular: Denies chest  pain. Gastrointestinal: No abdominal pain.  No nausea, vomiting, diarrhea. Genitourinary: Negative for dysuria. Musculoskeletal: Positive for back pain and bilateral lower extremity pain.  Skin: Negative for rash. Neurological: Negative for headaches.   ____________________________________________   PHYSICAL EXAM:  VITAL SIGNS: Patient Vitals for the past 24 hrs:  BP Temp Temp src Pulse Resp SpO2 Height Weight  09/05/17 1543 (!) 147/104 97.8 F (36.6 C) Oral 72 16 98 % - -  09/05/17 1250 (!) 140/106 (!) 97.5 F (36.4 C) Oral 100 16 97 % 6' (1.829 m) 108.9 kg (240 lb)   Constitutional: Alert and oriented. Well appearing and in no acute distress.  Eyes: Conjunctivae are normal. PERRL. Head: Normocephalic and atraumatic. Neck:Supple. No thyromegaly. No stridor.  Cardiovascular: Normal rate, regular rhythm.  Good peripheral circulation. Respiratory: Normal respiratory effort without tachypnea or retractions. Musculoskeletal:Negative tenderness along lumbar spinous process.  Palpable tenderness along lumbar paraspinals glut medius. Bilateral lower extremity radicular pain to the ankle  without changes in sensation or strength.  Neurologic: Normal speech and language.  Skin:  Skin is warm, dry and intact. No rash noted. Psychiatric: Mood and affect are normal. Speech and behavior are normal. Patient exhibits appropriate insight and judgement.  ____________________________________________   LABS (all labs ordered are listed, but only abnormal results are displayed)  Labs Reviewed - No data to display ____________________________________________  EKG none ____________________________________________  RADIOLOGY DG lumbar complete FINDINGS: Five lumbar type vertebral bodies are well visualized. Vertebral body height is well maintained chronic changes at T12 are noted. Mild osteophytic changes are seen. Disc space narrowing at L5-S1 is again noted and stable. No pars defects are  seen. No anterolisthesis is noted. No soft tissue abnormality is noted.  IMPRESSION: Degenerative change without acute abnormality. ____________________________________________   PROCEDURES  Procedure(s) performed: no   Critical Care performed: no ____________________________________________   INITIAL IMPRESSION / ASSESSMENT AND PLAN / ED COURSE  Pertinent labs & imaging results that were available during my care of the patient were reviewed by me and considered in my medical decision making (see chart for details).   Patient presents to emergency department with lumbar back bilateral lower extremity pain after performing yard work yesterday. History, physical exam findings and imaging reassuring symptoms are consistent with exacerbation of lumbar back pain with sciatica.  Imaging was negative for acute fracture, dislocation or subluxation.  Patient noted improvement of symptoms following decadron and norflex given during the course of care in the emergency department. Patient will be prescribed prednisone taper and norflex as needed for muscle spasms. Patient advised to follow up with neurosurgery referral or return to the emergency department if symptoms significantly worsened. Patient informed of clinical course, understand medical decision-making process, and agree with plan.     ____________________________________________   FINAL CLINICAL IMPRESSION(S) / ED DIAGNOSES  Final diagnoses:  Strain of lumbar region, initial encounter  Muscle spasm of back  Bilateral sciatica       NEW MEDICATIONS STARTED  DURING THIS VISIT:  This SmartLink is deprecated. Use AVSMEDLIST instead to display the medication list for a patient.   Note:  This document was prepared using Dragon voice recognition software and may include unintentional dictation errors.    Alric Quan 09/05/17 1715    Schaevitz, Randall An, MD 09/06/17 Kathyrn Drown

## 2017-09-05 NOTE — ED Notes (Signed)
Patient does not appear to be in any acute distress at time of discharge. Patient wheeled to lobby ion wheelchair. Patient denies any comments or concerns regarding discharge.

## 2017-09-28 ENCOUNTER — Other Ambulatory Visit: Payer: Self-pay | Admitting: Physician Assistant

## 2017-09-28 DIAGNOSIS — M5416 Radiculopathy, lumbar region: Secondary | ICD-10-CM

## 2017-10-02 ENCOUNTER — Ambulatory Visit
Admission: RE | Admit: 2017-10-02 | Discharge: 2017-10-02 | Disposition: A | Discharge status: Home / Self Care | Payer: Medicaid Other | Source: Ambulatory Visit | Attending: Physician Assistant | Admitting: Physician Assistant

## 2017-10-02 DIAGNOSIS — M5116 Intervertebral disc disorders with radiculopathy, lumbar region: Secondary | ICD-10-CM | POA: Diagnosis not present

## 2017-10-02 DIAGNOSIS — M5416 Radiculopathy, lumbar region: Secondary | ICD-10-CM

## 2017-10-02 DIAGNOSIS — M48061 Spinal stenosis, lumbar region without neurogenic claudication: Secondary | ICD-10-CM | POA: Diagnosis not present

## 2017-10-06 DIAGNOSIS — M48061 Spinal stenosis, lumbar region without neurogenic claudication: Secondary | ICD-10-CM | POA: Insufficient documentation

## 2017-10-19 ENCOUNTER — Emergency Department
Admission: EM | Admit: 2017-10-19 | Discharge: 2017-10-19 | Discharge status: Against Medical Advice | Payer: Medicaid Other | Attending: Emergency Medicine | Admitting: Emergency Medicine

## 2017-10-19 DIAGNOSIS — R112 Nausea with vomiting, unspecified: Secondary | ICD-10-CM | POA: Diagnosis not present

## 2017-10-19 DIAGNOSIS — M545 Low back pain: Secondary | ICD-10-CM | POA: Diagnosis present

## 2017-10-19 DIAGNOSIS — Z79899 Other long term (current) drug therapy: Secondary | ICD-10-CM | POA: Insufficient documentation

## 2017-10-19 DIAGNOSIS — M5441 Lumbago with sciatica, right side: Secondary | ICD-10-CM

## 2017-10-19 DIAGNOSIS — M5442 Lumbago with sciatica, left side: Secondary | ICD-10-CM

## 2017-10-19 DIAGNOSIS — R110 Nausea: Secondary | ICD-10-CM

## 2017-10-19 LAB — CBC
HEMATOCRIT: 42.9 % (ref 40.0–52.0)
HEMOGLOBIN: 14.6 g/dL (ref 13.0–18.0)
MCH: 30.1 pg (ref 26.0–34.0)
MCHC: 34.1 g/dL (ref 32.0–36.0)
MCV: 88.3 fL (ref 80.0–100.0)
Platelets: 273 10*3/uL (ref 150–440)
RBC: 4.86 MIL/uL (ref 4.40–5.90)
RDW: 14.4 % (ref 11.5–14.5)
WBC: 13.5 10*3/uL — ABNORMAL HIGH (ref 3.8–10.6)

## 2017-10-19 LAB — COMPREHENSIVE METABOLIC PANEL
ALBUMIN: 4.3 g/dL (ref 3.5–5.0)
ALK PHOS: 71 U/L (ref 38–126)
ALT: 65 U/L — ABNORMAL HIGH (ref 17–63)
ANION GAP: 11 (ref 5–15)
AST: 55 U/L — ABNORMAL HIGH (ref 15–41)
BUN: 12 mg/dL (ref 6–20)
CALCIUM: 9.3 mg/dL (ref 8.9–10.3)
CO2: 23 mmol/L (ref 22–32)
Chloride: 104 mmol/L (ref 101–111)
Creatinine, Ser: 1.15 mg/dL (ref 0.61–1.24)
GFR calc Af Amer: >60 mL/min (ref >60)
GFR calc non Af Amer: >60 mL/min (ref >60)
GLUCOSE: 142 mg/dL — AB (ref 65–99)
Potassium: 4 mmol/L (ref 3.5–5.1)
SODIUM: 138 mmol/L (ref 135–145)
Total Bilirubin: 0.9 mg/dL (ref 0.3–1.2)
Total Protein: 7.8 g/dL (ref 6.5–8.1)

## 2017-10-19 LAB — LIPASE, BLOOD: Lipase: 28 U/L (ref 11–51)

## 2017-10-19 NOTE — ED Triage Notes (Addendum)
Pt presents today via [facility] from home for abd pain. Pt states it it started today.EMS states the pt has chronic back pain and n/v is worsening the pain. Pt states he has taken 3 zofran pills today and in transit he got 4mg  IV. Pt has a hx of gastritis. Pt says he normally controls it with home meds. Pt states he has had diarrhea but none today. Pt is A/Ox4 awaiting EDP.

## 2017-10-19 NOTE — ED Notes (Signed)
Patient is stating he wants to leave AMA and that we wont be able to help him. Dr. Jacqualine Code notified and at bedside to examine patient.

## 2017-10-19 NOTE — ED Provider Notes (Signed)
[facility] Emergency Department Provider Note   ____________________________________________   First MD Initiated Contact with Patient 10/19/17 2220     (approximate)  I have reviewed the triage vital signs and the nursing notes.   HISTORY  Chief Complaint Low back pain and "gastritis"   HPI Gregory Oconnell is a 52 y.o. male reports that for about 6 weeks now has been having severe intractable very low back pain.  He has an appointment coming up with [place] pain specialist, but has not yet been able to see them.  He has seen neurology and had an MRI of his back recently.  He reports he is having ongoing unremitting low back pain that nothing seems to make it better or worse.  It radiates down into the back of both of his thighs.  No muscle weakness, but reports pain limits his ability to walk.  No bowel or bladder trouble.  Yesterday began experiencing "gastritis" reports that he gets this from time to time and he began having discomfort in his upper abdomen with occasional vomiting.  After receiving Zofran at the ER he is starting to feel better.  Reports that he has slight upset of his stomach now, but no significant or severe abdominal pain, rather reports he thinks the pain is back which is making him nauseated.  No chest pain or trouble breathing.  No pain or problems with urination.  No new numbness or weakness.   Past Medical History:  Diagnosis Date  . Arthritis   . GERD (gastroesophageal reflux disease)   . Kidney disease     There are no active problems to display for this patient.   Past Surgical History:  Procedure Laterality Date  . COLONOSCOPY WITH PROPOFOL N/A 02/03/2016   Procedure: COLONOSCOPY WITH PROPOFOL;  Surgeon: Hulen Luster, MD;  Location: [facility] ENDOSCOPY;  Service: Gastroenterology;  Laterality: N/A;  . ESOPHAGOGASTRODUODENOSCOPY (EGD) WITH PROPOFOL N/A 02/03/2016   Procedure: ESOPHAGOGASTRODUODENOSCOPY (EGD) WITH PROPOFOL;  Surgeon:  Hulen Luster, MD;  Location: [facility] ENDOSCOPY;  Service: Gastroenterology;  Laterality: N/A;  . KNEE SURGERY      Prior to Admission medications   Medication Sig Start Date End Date Taking? Authorizing Provider  colchicine 0.6 MG tablet Take 1 tablet (0.6 mg total) by mouth 2 (two) times daily as needed. 12/02/16 12/02/17  Carrie Mew, MD  famotidine (PEPCID) 20 MG tablet Take 1 tablet (20 mg total) by mouth 2 (two) times daily. 12/02/16   Carrie Mew, MD  gabapentin (NEURONTIN) 300 MG capsule Take 300 mg by mouth daily.    [provider]  HYDROcodone-acetaminophen (NORCO/VICODIN) 5-325 MG tablet Take 1 tablet by mouth every 4 (four) hours as needed for moderate pain. 03/16/16   Lavonia Drafts, MD  methylPREDNISolone (MEDROL) 4 MG TBPK tablet Take by mouth daily. Day 1: 6 tablets Day 2: 5 tablets Day 3: 4 tablets Day 4: 3 tablets Day 5: 2 tablets Day 6: 1 tablet 12/02/16   Carrie Mew, MD  omeprazole (PRILOSEC) 40 MG capsule Take 40 mg by mouth daily.    [provider]  ondansetron (ZOFRAN ODT) 4 MG disintegrating tablet Take 1 tablet (4 mg total) by mouth every 8 (eight) hours as needed for nausea or vomiting. 12/02/16   Carrie Mew, MD  oxyCODONE-acetaminophen (ROXICET) 5-325 MG tablet Take 1 tablet by mouth every 6 (six) hours as needed for severe pain. 12/02/16   Carrie Mew, MD  predniSONE (STERAPRED UNI-PAK 21 TAB) 10 MG (21)  TBPK tablet Take 6 tablets on day 1. Take 5 tablets on day 2. Take 4 tablets on day 3. Take 3 tablets on day 4. Take 2 tablets on day 5. Take 1 tablets on day 6. 09/05/17   Little, Traci M, PA-C  promethazine (PHENERGAN) 12.5 MG tablet Take 1 tablet (12.5 mg total) by mouth every 6 (six) hours as needed for nausea or vomiting. 06/30/16   Clearnce Hasten, Randall An, MD    Allergies Patient has no known allergies.  No family history on file.  Social History Social History   Tobacco Use  . Smoking status: Never Smoker  .  Smokeless tobacco: Never Used  Substance Use Topics  . Alcohol use: Yes  . Drug use: Yes    Types: Marijuana    Review of Systems Constitutional: No fever/chills Eyes: No visual changes. ENT: No sore throat. Cardiovascular: Denies chest pain. Respiratory: Denies shortness of breath. Gastrointestinal:  No diarrhea.  No constipation. Genitourinary: Negative for dysuria. Musculoskeletal: Severe back pain, in his lower back only.  Is been steady for 6 weeks time. Skin: Negative for rash. Neurological: Negative for headaches, focal weakness or numbness.    ____________________________________________   PHYSICAL EXAM:  VITAL SIGNS: ED Triage Vitals  Enc Vitals Group     BP 10/19/17 2043 (!) 186/115     Pulse Rate 10/19/17 2141 71     Resp 10/19/17 2043 19     Temp 10/19/17 2043 98.6 F (37 C)     Temp Source 10/19/17 2043 Oral     SpO2 10/19/17 2043 100 %     Weight 10/19/17 2043 250 lb (113.4 kg)     Height 10/19/17 2043 6' (1.829 m)     Head Circumference --      Peak Flow --      Pain Score 10/19/17 2042 9     Pain Loc --      Pain Edu? --      Excl. in [place]? --     Constitutional: Alert and oriented. Well appearing and in no acute distress though he is somewhat upset about having to wait to be evaluated tonight, reports that he would really just wants something for her pain did not go the pain in his back. Eyes: Conjunctivae are normal. Head: Atraumatic. Nose: No congestion/rhinnorhea. Mouth/Throat: Mucous membranes are moist. Neck: No stridor.   Cardiovascular: Normal rate, regular rhythm. Grossly normal heart sounds.  Good peripheral circulation. Respiratory: Normal respiratory effort.  No retractions. Lungs CTAB. Gastrointestinal: Soft and nontender. No distention.  He is somewhat obese.  He denies any focal abdominal discomfort.  Reports his pain and discomfort his abdomen is better now.  No hernias. Musculoskeletal: No lower extremity tenderness but does have  about 1+ bilateral lower extremity edema which she reports is been having for over a month now.  No muscular weakness, but pain in his lower back limits his ability to walk fully.  No loss of sensation in lower extremities.  Lower extremities warm well perfused, no decreased capillary refill or reduced dorsalis pedis pulse. Neurologic:  Normal speech and language. No gross focal neurologic deficits are appreciated.  Skin:  Skin is warm, dry and intact. No rash noted. Psychiatric: Mood and affect are normal. Speech and behavior are normal.  ____________________________________________   LABS (all labs ordered are listed, but only abnormal results are displayed)  Labs Reviewed  COMPREHENSIVE METABOLIC PANEL - Abnormal; Notable for the following components:      Result Value  Glucose, Bld 142 (*)    AST 55 (*)    ALT 65 (*)    All other components within normal limits  CBC - Abnormal; Notable for the following components:   WBC 13.5 (*)    All other components within normal limits  LIPASE, BLOOD  URINALYSIS, COMPLETE (UACMP) WITH MICROSCOPIC   ____________________________________________  EKG   ____________________________________________  RADIOLOGY   ____________________________________________   PROCEDURES  Procedure(s) performed: None  Procedures  Critical Care performed: No  ____________________________________________   INITIAL IMPRESSION / ASSESSMENT AND PLAN / ED COURSE  Pertinent labs & imaging results that were available during my care of the patient were reviewed by me and considered in my medical decision making (see chart for details).  Discussed with the patient that we would like to further evaluate including providing him pain medication and obtaining a CT of his abdomen and pelvis to evaluate for any signs of infection, bowel blockage, because of his worsening pain and nausea.  He reports that this point he is tired, just wants to be able to go home,  and he was really just hoping to get prescription for some pain medication.  We discussed further and he and his family acknowledge that they understand opioids are no longer prescribe as much as they used to be due to the risks of prescribing, discussed with him and offered him happy to give him a couple of pain medication tablets and continue his evaluation in the ER but the patient reports that at this point he just wants to go home.  He reports his back pain has been steady for 6 weeks without progression, he seen a back specialist and is been referred to the pain clinic.  Does not abuse any acute decompensation regarding his back pain.  He is peripherally intact in his lower extremities, good use of all muscles with no evidence of support cauda equina.  No signs or symptoms of dissection    10/19/2017 at 10:44 PM:  The patient requested to leave.  I considered this to be leaving against medical advice. I personally discussed the following with them:  1)  That they currently had a medical condition of back pain with abdominal pain and nausea vomiting and I am concerned that they may have condition such as gastritis, bowel blockage, intra-abdominal infection, less likely problems with his large vessel the aorta though I cannot exclude that without further evaluation.    2)  My proposed course of evaluation and treatment includes, but is not limited to, obtaining a CT scan of the abdomen and pelvis, providing pain medicine and further workup to evaluate for causes of his pain.  Benefits of staying include possible diagnosis or excluding of infection, bowel blockage, aneurysm or an alternative serious problems, which if identified early would lead to appropriate intervention in a timely manner lessening the burden of disability and death.  3) Risks of leaving before this had been completed include:   misdiagnosis, worsening illness leading up to and including prolonged or  permanent disability or  death.    Despite this they stated they wanted to leave due to wanting to be home, needing pain medication and not wanting any further evaluation tonight and refused further evaluation, treatment, or admission at this time.   They appeared clinically sober, were mentating appropriately, were free from distracting injury, had adequately controlled acute pain, appeared to have intact insight, judgment, and reason, and in my opinion had the capacity to make this decision.  I recommended they follow-up with his regular doctor and pain specialist at the earliest available opportunity/appointment for further evaluation and treatment.   The patient was discharged against medical advice.  They did accept written discharge instructions.    ____________________________________________   FINAL CLINICAL IMPRESSION(S) / ED DIAGNOSES  Final diagnoses:  Nausea  Bilateral low back pain with bilateral sciatica, unspecified chronicity      NEW MEDICATIONS STARTED DURING THIS VISIT:  This SmartLink is deprecated. Use AVSMEDLIST instead to display the medication list for a patient.   Note:  This document was prepared using Dragon voice recognition software and may include unintentional dictation errors.     Delman Kitten, MD 10/19/17 [phone]

## 2017-10-19 NOTE — Discharge Instructions (Signed)
You were seen in the emergency room for abdominal pain. It is important that you follow up closely with your primary care doctor doctor and neurology specialist as soon as possible.  Please return to the emergency room right away if you are to develop a fever, severe nausea, your pain becomes severe or worsens, you are unable to keep food down, begin vomiting any dark or bloody fluid, you develop any dark or bloody stools, feel dehydrated, or other new concerns or symptoms arise.

## 2017-10-19 NOTE — ED Triage Notes (Signed)
Labs sent at this time.

## 2017-10-27 ENCOUNTER — Other Ambulatory Visit: Payer: Self-pay

## 2017-10-27 ENCOUNTER — Ambulatory Visit: Payer: Medicaid Other | Attending: Neurosurgery

## 2017-10-27 DIAGNOSIS — R2689 Other abnormalities of gait and mobility: Secondary | ICD-10-CM | POA: Insufficient documentation

## 2017-10-27 DIAGNOSIS — G8929 Other chronic pain: Secondary | ICD-10-CM

## 2017-10-27 DIAGNOSIS — M6281 Muscle weakness (generalized): Secondary | ICD-10-CM

## 2017-10-27 DIAGNOSIS — M5441 Lumbago with sciatica, right side: Secondary | ICD-10-CM | POA: Diagnosis present

## 2017-10-27 DIAGNOSIS — M5442 Lumbago with sciatica, left side: Secondary | ICD-10-CM | POA: Insufficient documentation

## 2017-10-27 NOTE — Patient Instructions (Signed)
Access Code: [email or web address]  URL: [email or web address]  Date: 10/27/2017  Prepared by: Roxana Hires   Exercises  Supine Posterior Pelvic Tilt - 10 reps - 2 sets - 3 hold - 3x daily - 7x weekly  Hooklying Single Knee to Chest - 3 sets - 30 hold - 3x daily - 7x weekly

## 2017-10-27 NOTE — Therapy (Signed)
[facility] [address], [place], [address] Phone: [phone]   Fax:  [phone]  Physical Therapy Evaluation  Patient Details  Name: Gregory Oconnell MRN: [id number] Date of Birth: 16-Mar-1966 Referring Provider: Consuella Lose   Encounter Date: 10/27/2017  PT End of Session - 10/29/17 1035    Visit Number  1    Number of Visits  13    Date for PT Re-Evaluation  12/08/17    PT Start Time  2202    PT Stop Time  1615    PT Time Calculation (min)  60 min    Activity Tolerance  Patient tolerated treatment well    Behavior During Therapy  [facility] for tasks assessed/performed       Past Medical History:  Diagnosis Date  . Arthritis   . GERD (gastroesophageal reflux disease)   . Kidney disease     Past Surgical History:  Procedure Laterality Date  . COLONOSCOPY WITH PROPOFOL N/A 02/03/2016   Procedure: COLONOSCOPY WITH PROPOFOL;  Surgeon: Hulen Luster, MD;  Location: [facility] ENDOSCOPY;  Service: Gastroenterology;  Laterality: N/A;  . ESOPHAGOGASTRODUODENOSCOPY (EGD) WITH PROPOFOL N/A 02/03/2016   Procedure: ESOPHAGOGASTRODUODENOSCOPY (EGD) WITH PROPOFOL;  Surgeon: Hulen Luster, MD;  Location: [facility] ENDOSCOPY;  Service: Gastroenterology;  Laterality: N/A;  . KNEE SURGERY      There were no vitals filed for this visit.   Subjective Assessment - 10/29/17 1027    Subjective  Low back pain    Pertinent History  Gregory Oconnell is a 52 y.o. male referred by [facility] for spinal stenosis with bilateral LE numbness/tingling. He has a longstanding history of chronic back pain. He reports that over the last 6-8 months it has worsened significantly. He gets a sharp pain when he stands up and "straightens up." Pain radiates down bilateral LEs. He reports worsening of weakness in RLE. He states that he has a follow-up appointment with [facility] upcoming but he is unclear of the date.  He said that they are going to try injections and if he cannot get any relief they will discuss surgery. He has a history of gout in both feet and has had a recent flare causing a lot of problems. He had a recent episode of gastritis and went to [facility] ED but left AMA. His nausea has persisted in the mornings but appears to be slightly improving. No saddle paresthesia or loss of bowel/bladder. Pt denies any numbness/tingling in bilateral LE on this date.     Limitations  Walking    How long can you walk comfortably?  5 minutes    Diagnostic tests  MRI: Disc disease and facet disease at L4-5 as discussed above. Significant multifactorial spinal and bilateral lateral recess    Patient Stated Goals  Pt would like to be able to work without pain    Currently in Pain?  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  4-5 times due to work related accidents    Has the patient had a decrease in activity level because of  a fear of falling?   Yes    Is the patient reluctant to leave their home because of a fear of falling?   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, camping, riding bikes      Cognition   Overall Cognitive Status  Within Functional Limits for tasks assessed      Observation/Other Assessments   Other Surveys   Other Surveys    Modified [place]  54%    Fear Avoidance Belief Questionnaire (FABQ)   FABQ: 73/96, PA: 29/30, W: 44/66      Sensation   Additional Comments  Pt reports intact sensation today to light touch throughout bilateral LEs      Posture/Postural Control   Posture Comments  Forward head, rounded shoulders, loss of natural lumbar lordosis      ROM / Strength   AROM / PROM / Strength  AROM;Strength      AROM   Overall AROM Comments  Pt with very guarded lumbar spine movements. He moves slowly but denies pain with lumbar flexion however reports pain when returning to standing position. Limited segmental mobility of lumbar spine with flexion. Pt with increase in pain with repeated lumbar extension. Limted lateral lumbar flexion and rotation secondary to pain. Painful bridge, positive hip scour on R, negative on L, negative slump bilaterally. Negative SLR bilaterally. Positive [place] bilaterally for rectus tightness but no back pain. Painful and guarded CPA of lumbar spine. Unable to fully assess mobility due to pain; 25 degrees L hip IR, at least 40 degrees otherwise IR/ER bilaterally. Significant restriction in hip extension bilaterally      Strength   Overall Strength Comments  4-/5 hip abduction/adduction bilaterally. Otherwise gross LE screening is [facility] without notable focal weakness.       Palpation   Palpation comment  Pt denies pain with palpation along lumbar paraspinals and  posterior hip bilaterally.           Objective measurements completed on examination: See above findings.      TREATMENT  Ther-ex  Pt instructed in single knee to chest stretches bilaterally, 30s hold x 2 bilateral; Pt performed posterior pelvic tilts with therapist 3s hold x 10; Extensive cues, verbal education, and handout provided to patient  explaining exercises.             PT Short Term Goals - 10/29/17 1136      PT SHORT TERM GOAL #1   Title  Pt will be independent with HEP in order to improve strength and decrease pain in order to improve function at home and work.     Time  4    Period  Weeks    Status  New    Target Date  11/24/17        PT Long Term Goals - 10/29/17 1136      PT LONG TERM GOAL #1   Title  Pt will decrease mODI scoreby at least 13 points in order demonstrate clinically significant reduction in pain/disability     Baseline  10/27/17: 54%    Time  6    Period  Weeks    Status  New    Target Date  12/08/17      PT LONG TERM GOAL #2   Title  Pt will decrease worst pain as reported on NPRS by at least 3 points in order to demonstrate clinically significant reduction in pain.    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     Baseline  10/27/17: currently 4-/5 bilaterally    Time  6    Period  Weeks    Status  New    Target Date  12/08/17             Plan - 10/29/17 1044    Clinical Impression Statement  Pt is a pleasant 52 yo M referred  by [facility] for spinal stenosis with bilateral LE numbness/tingling.  He has a longstanding history of chronic back pain. He reports that over the  last 6-8 months it has worsened significantly. He gets a sharp pain when he stands up and "straightens up." Pain radiates down bilateral LEs. He reports worsening of weakness in RLE. Pt reports that his MD is going to try injections and if he cannot get any relief they will discuss surgery. He has a history of gout in both feet and has had a recent flare causing a lot of problems. MRI showed disc disease and facet disease at L4-5 as discussed above. Significant multifactorial spinal and bilateral lateral recess stenosis at L4-5. Moderate bilateral foraminal stenosis at L5-S1. PT evaluation today showed poor posture with forward head, rounded shoulders, loss of natural lumbar lordosis. Pt with very guarded lumbar spine movements. He has pain is almost all planes however flexion is the least aggravating. Painful bridging, positive hip scour on R. Negative slump and SLR bilaterally. Positive [place] bilaterally for rectus tightness but no back pain. Painful and guarded CPA of lumbar spine. Unable to fully assess mobility of spine due to pain. Pt with limited L hip internal rotation and limited hip extension bilaterally. He will benefit from skilled PT services to address deficits in mobility, strength, and pain in order to decrease pain and improve function at home and work.     History and  Personal Factors relevant to plan of care:  2 personal factors/comorbidities, 3 body systems/activity limitations/participation restrictions      Clinical Presentation  Unstable    Clinical Presentation due to:  Highly variable pain, worsened over the last couple months    Clinical Decision Making  Moderate    Rehab Potential  Fair    PT Frequency  2x / week    PT Duration  6 weeks    PT Treatment/Interventions  Aquatic Therapy;Biofeedback;Cryotherapy;Electrical Stimulation;Iontophoresis 4mg /ml Dexamethasone;Moist Heat;Traction;Ultrasound;Gait training;Therapeutic activities;Therapeutic exercise;Patient/family  education;Neuromuscular re-education;Manual techniques;Dry needling;Passive range of motion    PT Next Visit Plan  Progress low level strengthening/stretching program, modalities and manual as needed for pain control    PT Home Exercise Plan  Single knee to chest stretch, posterior pelvic tilts    Consulted and Agree with Plan of Care  Patient       Patient will benefit from skilled therapeutic intervention in order to improve the following deficits and impairments:  Abnormal gait, Pain, Postural dysfunction, Decreased strength  Visit Diagnosis: Chronic bilateral low back pain with bilateral sciatica - Plan: PT plan of care cert/re-cert  Muscle weakness (generalized) - Plan: PT plan of care cert/re-cert  Other abnormalities of gait and mobility - Plan: PT plan of care cert/re-cert     Problem List There are no active problems to display for this patient.  Phillips Grout PT, DPT   Huprich,Jason 10/29/2017, 11:41 AM  [facility] [address] [place], [address] Phone: [phone]   Fax:  [phone]  Name: Gregory Oconnell MRN: [id number] Date of Birth: 1966/06/27

## 2017-11-03 ENCOUNTER — Ambulatory Visit: Payer: Medicaid Other

## 2017-11-09 ENCOUNTER — Other Ambulatory Visit: Payer: Self-pay | Admitting: Family Medicine

## 2017-11-09 DIAGNOSIS — R112 Nausea with vomiting, unspecified: Secondary | ICD-10-CM

## 2017-11-10 ENCOUNTER — Ambulatory Visit: Payer: Medicaid Other | Admitting: Physical Therapy

## 2017-11-15 ENCOUNTER — Ambulatory Visit: Payer: Medicaid Other

## 2017-11-16 ENCOUNTER — Ambulatory Visit: Payer: Medicaid Other

## 2017-11-17 ENCOUNTER — Ambulatory Visit: Payer: Medicaid Other | Admitting: Physical Therapy

## 2017-11-23 ENCOUNTER — Ambulatory Visit
Admission: RE | Admit: 2017-11-23 | Discharge: 2017-11-23 | Disposition: A | Discharge status: Home / Self Care | Payer: Medicaid Other | Source: Ambulatory Visit | Attending: Family Medicine | Admitting: Family Medicine

## 2017-11-23 DIAGNOSIS — R112 Nausea with vomiting, unspecified: Secondary | ICD-10-CM | POA: Insufficient documentation

## 2017-11-23 DIAGNOSIS — K760 Fatty (change of) liver, not elsewhere classified: Secondary | ICD-10-CM | POA: Insufficient documentation

## 2017-11-23 DIAGNOSIS — I700 Atherosclerosis of aorta: Secondary | ICD-10-CM | POA: Diagnosis not present

## 2017-11-29 ENCOUNTER — Other Ambulatory Visit: Payer: Self-pay | Admitting: Family Medicine

## 2017-11-29 DIAGNOSIS — R112 Nausea with vomiting, unspecified: Secondary | ICD-10-CM

## 2017-11-29 DIAGNOSIS — K769 Liver disease, unspecified: Secondary | ICD-10-CM

## 2017-12-08 ENCOUNTER — Ambulatory Visit
Admission: RE | Admit: 2017-12-08 | Discharge: 2017-12-08 | Disposition: A | Discharge status: Home / Self Care | Payer: Medicaid Other | Source: Ambulatory Visit | Attending: Family Medicine | Admitting: Family Medicine

## 2017-12-08 DIAGNOSIS — D739 Disease of spleen, unspecified: Secondary | ICD-10-CM | POA: Diagnosis not present

## 2017-12-08 DIAGNOSIS — M5137 Other intervertebral disc degeneration, lumbosacral region: Secondary | ICD-10-CM | POA: Diagnosis not present

## 2017-12-08 DIAGNOSIS — K760 Fatty (change of) liver, not elsewhere classified: Secondary | ICD-10-CM | POA: Diagnosis not present

## 2017-12-08 DIAGNOSIS — K769 Liver disease, unspecified: Secondary | ICD-10-CM

## 2017-12-08 DIAGNOSIS — R112 Nausea with vomiting, unspecified: Secondary | ICD-10-CM

## 2017-12-08 MED ORDER — GADOBENATE DIMEGLUMINE 529 MG/ML IV SOLN
20.0000 mL | Freq: Once | INTRAVENOUS | Status: AC | PRN
  Administered 2017-12-08: 20 mL via INTRAVENOUS

## 2018-04-19 ENCOUNTER — Other Ambulatory Visit: Payer: Self-pay

## 2018-04-19 ENCOUNTER — Emergency Department
Admission: EM | Admit: 2018-04-19 | Discharge: 2018-04-19 | Disposition: A | Discharge status: Home / Self Care | Payer: Medicaid Other | Attending: Student in an Organized Health Care Education/Training Program | Admitting: Student in an Organized Health Care Education/Training Program

## 2018-04-19 ENCOUNTER — Encounter: Payer: Self-pay | Admitting: Emergency Medicine

## 2018-04-19 ENCOUNTER — Emergency Department: Payer: Medicaid Other

## 2018-04-19 DIAGNOSIS — R519 Headache, unspecified: Secondary | ICD-10-CM

## 2018-04-19 DIAGNOSIS — R110 Nausea: Secondary | ICD-10-CM | POA: Diagnosis not present

## 2018-04-19 DIAGNOSIS — Z79899 Other long term (current) drug therapy: Secondary | ICD-10-CM | POA: Insufficient documentation

## 2018-04-19 DIAGNOSIS — R079 Chest pain, unspecified: Secondary | ICD-10-CM | POA: Diagnosis present

## 2018-04-19 DIAGNOSIS — R51 Headache: Secondary | ICD-10-CM | POA: Insufficient documentation

## 2018-04-19 DIAGNOSIS — E860 Dehydration: Secondary | ICD-10-CM

## 2018-04-19 LAB — URINALYSIS, COMPLETE (UACMP) WITH MICROSCOPIC
Bacteria, UA: NONE SEEN
GLUCOSE, UA: NEGATIVE mg/dL
Hgb urine dipstick: NEGATIVE
Ketones, ur: NEGATIVE mg/dL
Leukocytes, UA: NEGATIVE
NITRITE: NEGATIVE
PH: 5 (ref 5.0–8.0)
PROTEIN: 30 mg/dL — AB
Specific Gravity, Urine: 1.026 (ref 1.005–1.030)

## 2018-04-19 LAB — CBC
HEMATOCRIT: 44.3 % (ref 40.0–52.0)
Hemoglobin: 15.6 g/dL (ref 13.0–18.0)
MCH: 31.9 pg (ref 26.0–34.0)
MCHC: 35.1 g/dL (ref 32.0–36.0)
MCV: 90.8 fL (ref 80.0–100.0)
PLATELETS: 299 10*3/uL (ref 150–440)
RBC: 4.88 MIL/uL (ref 4.40–5.90)
RDW: 14.4 % (ref 11.5–14.5)
WBC: 13.9 10*3/uL — ABNORMAL HIGH (ref 3.8–10.6)

## 2018-04-19 LAB — BASIC METABOLIC PANEL
Anion gap: 12 (ref 5–15)
BUN: 30 mg/dL — ABNORMAL HIGH (ref 6–20)
CHLORIDE: 103 mmol/L (ref 98–111)
CO2: 23 mmol/L (ref 22–32)
CREATININE: 1.55 mg/dL — AB (ref 0.61–1.24)
Calcium: 9.6 mg/dL (ref 8.9–10.3)
GFR calc non Af Amer: 49 mL/min — ABNORMAL LOW (ref >60)
GFR, EST AFRICAN AMERICAN: 57 mL/min — AB (ref >60)
Glucose, Bld: 135 mg/dL — ABNORMAL HIGH (ref 70–99)
Potassium: 3.8 mmol/L (ref 3.5–5.1)
Sodium: 138 mmol/L (ref 135–145)

## 2018-04-19 LAB — TROPONIN I: Troponin I: <0.03 ng/mL (ref <0.03)

## 2018-04-19 LAB — CK: Total CK: 500 U/L — ABNORMAL HIGH (ref 49–397)

## 2018-04-19 MED ORDER — DIPHENHYDRAMINE HCL 50 MG/ML IJ SOLN
12.5000 mg | Freq: Once | INTRAMUSCULAR | Status: AC
  Administered 2018-04-19: 12.5 mg via INTRAVENOUS
  Filled 2018-04-19: qty 1

## 2018-04-19 MED ORDER — SODIUM CHLORIDE 0.9 % IV BOLUS
1000.0000 mL | Freq: Once | INTRAVENOUS | Status: AC
  Administered 2018-04-19: 1000 mL via INTRAVENOUS

## 2018-04-19 MED ORDER — HYDROCODONE-ACETAMINOPHEN 5-325 MG PO TABS
1.0000 | ORAL_TABLET | Freq: Once | ORAL | Status: AC
  Administered 2018-04-19: 1 via ORAL
  Filled 2018-04-19: qty 1

## 2018-04-19 MED ORDER — PROCHLORPERAZINE EDISYLATE 10 MG/2ML IJ SOLN
10.0000 mg | Freq: Once | INTRAMUSCULAR | Status: AC
  Administered 2018-04-19: 10 mg via INTRAVENOUS
  Filled 2018-04-19: qty 2

## 2018-04-19 NOTE — ED Triage Notes (Signed)
Pt to ED from home c/o left chest pain that started yesterday, SOB, nausea.  States has been working out in heat past couple days.  Pain states pressure to chest.

## 2018-04-19 NOTE — ED Notes (Signed)
Ambulated to the BR.  Gait steady. NAD

## 2018-04-19 NOTE — ED Provider Notes (Signed)
[facility] Emergency Department Provider Note    None    (approximate)  I have reviewed the triage vital signs and the nursing notes.   HISTORY  Chief Complaint Chest Pain    HPI Gregory Oconnell is a 52 y.o. male with a history of chronic back pain as well as several visits to the ER for nausea and epigastric discomfort following up in pain clinic for chronic back pain presents the ER for feeling of feeling severely dehydrated after working outside for the past 2 days in the heat.  Patient feels very dehydrated has noted decreased urine output headache and generalized muscle aches.  Has been doing heavy lifting as well.  Denies any new numbness or tingling.  States he is got chronic weakness and tingling to the left leg that is been ongoing for 2 years and follows with neurosurgery.  Denies any new symptoms related to that.  Denies any sudden onset headache.  No neck pain.  No fevers.  Denies any chest pain or shortness of breath at this time.  Was having some chest discomfort earlier related to some nausea that he was having but denies any at this time.  No pain when taking deep breath.    Past Medical History:  Diagnosis Date  . Arthritis   . GERD (gastroesophageal reflux disease)   . Kidney disease    History reviewed. No pertinent family history. Past Surgical History:  Procedure Laterality Date  . COLONOSCOPY WITH PROPOFOL N/A 02/03/2016   Procedure: COLONOSCOPY WITH PROPOFOL;  Surgeon: Hulen Luster, MD;  Location: [facility] ENDOSCOPY;  Service: Gastroenterology;  Laterality: N/A;  . ESOPHAGOGASTRODUODENOSCOPY (EGD) WITH PROPOFOL N/A 02/03/2016   Procedure: ESOPHAGOGASTRODUODENOSCOPY (EGD) WITH PROPOFOL;  Surgeon: Hulen Luster, MD;  Location: [facility] ENDOSCOPY;  Service: Gastroenterology;  Laterality: N/A;  . KNEE SURGERY     There are no active problems to display for this patient.     Prior to Admission medications   Medication Sig Start Date End Date  Taking? Authorizing Provider  colchicine 0.6 MG tablet Take 1 tablet (0.6 mg total) by mouth 2 (two) times daily as needed. 12/02/16 12/02/17  Carrie Mew, MD  famotidine (PEPCID) 20 MG tablet Take 1 tablet (20 mg total) by mouth 2 (two) times daily. 12/02/16   Carrie Mew, MD  gabapentin (NEURONTIN) 300 MG capsule Take 300 mg by mouth daily.    [provider]  HYDROcodone-acetaminophen (NORCO/VICODIN) 5-325 MG tablet Take 1 tablet by mouth every 4 (four) hours as needed for moderate pain. 03/16/16   Lavonia Drafts, MD  methylPREDNISolone (MEDROL) 4 MG TBPK tablet Take by mouth daily. Day 1: 6 tablets Day 2: 5 tablets Day 3: 4 tablets Day 4: 3 tablets Day 5: 2 tablets Day 6: 1 tablet 12/02/16   Carrie Mew, MD  omeprazole (PRILOSEC) 40 MG capsule Take 40 mg by mouth daily.    [provider]  ondansetron (ZOFRAN ODT) 4 MG disintegrating tablet Take 1 tablet (4 mg total) by mouth every 8 (eight) hours as needed for nausea or vomiting. 12/02/16   Carrie Mew, MD  oxyCODONE-acetaminophen (ROXICET) 5-325 MG tablet Take 1 tablet by mouth every 6 (six) hours as needed for severe pain. 12/02/16   Carrie Mew, MD  predniSONE (STERAPRED UNI-PAK 21 TAB) 10 MG (21) TBPK tablet Take 6 tablets on day 1. Take 5 tablets on day 2. Take 4 tablets on day 3. Take 3 tablets on day 4. Take 2 tablets on  day 5. Take 1 tablets on day 6. 09/05/17   Little, Traci M, PA-C  promethazine (PHENERGAN) 12.5 MG tablet Take 1 tablet (12.5 mg total) by mouth every 6 (six) hours as needed for nausea or vomiting. 06/30/16   Clearnce Hasten, Randall An, MD    Allergies Patient has no known allergies.    Social History Social History   Tobacco Use  . Smoking status: Never Smoker  . Smokeless tobacco: Never Used  Substance Use Topics  . Alcohol use: Yes    Comment: several beers every other week  . Drug use: Yes    Types: Marijuana    Review of Systems Patient denies headaches,  rhinorrhea, blurry vision, numbness, shortness of breath, chest pain, edema, cough, abdominal pain, nausea, vomiting, diarrhea, dysuria, fevers, rashes or hallucinations unless otherwise stated above in HPI. ____________________________________________   PHYSICAL EXAM:  VITAL SIGNS: Vitals:   04/19/18 1507  BP: (!) 135/106  Pulse: 95  Resp: 18  Temp: 97.9 F (36.6 C)  SpO2: 95%    Constitutional: Alert and oriented.  Eyes: Conjunctivae are normal.  Head: Atraumatic. Nose: No congestion/rhinnorhea. Mouth/Throat: Mucous membranes are moist.   Neck: No stridor. Painless ROM.  Cardiovascular: Normal rate, regular rhythm. Grossly normal heart sounds.  Good peripheral circulation. Respiratory: Normal respiratory effort.  No retractions. Lungs CTAB. Gastrointestinal: Soft and nontender. No distention. No abdominal bruits. No CVA tenderness. Genitourinary: deferred Musculoskeletal: No lower extremity tenderness nor edema.  No joint effusions. Neurologic:  Normal speech and language. No gross focal neurologic deficits are appreciated. No facial droop Skin:  Skin is warm, dry and intact. No rash noted. Psychiatric: Mood and affect are normal. Speech and behavior are normal.  ____________________________________________   LABS (all labs ordered are listed, but only abnormal results are displayed)  Results for orders placed or performed during the hospital encounter of 04/19/18 (from the past 24 hour(s))  Basic metabolic panel     Status: Abnormal   Collection Time: 04/19/18  3:09 PM  Result Value Ref Range   Sodium 138 135 - 145 mmol/L   Potassium 3.8 3.5 - 5.1 mmol/L   Chloride 103 98 - 111 mmol/L   CO2 23 22 - 32 mmol/L   Glucose, Bld 135 (H) 70 - 99 mg/dL   BUN 30 (H) 6 - 20 mg/dL   Creatinine, Ser 1.55 (H) 0.61 - 1.24 mg/dL   Calcium 9.6 8.9 - 10.3 mg/dL   GFR calc non Af Amer 49 (L) >60 mL/min   GFR calc Af Amer 57 (L) >60 mL/min   Anion gap 12 5 - 15  CBC     Status:  Abnormal   Collection Time: 04/19/18  3:09 PM  Result Value Ref Range   WBC 13.9 (H) 3.8 - 10.6 K/uL   RBC 4.88 4.40 - 5.90 MIL/uL   Hemoglobin 15.6 13.0 - 18.0 g/dL   HCT 44.3 40.0 - 52.0 %   MCV 90.8 80.0 - 100.0 fL   MCH 31.9 26.0 - 34.0 pg   MCHC 35.1 32.0 - 36.0 g/dL   RDW 14.4 11.5 - 14.5 %   Platelets 299 150 - 440 K/uL  Troponin I     Status: None   Collection Time: 04/19/18  3:09 PM  Result Value Ref Range   Troponin I <0.03 <0.03 ng/mL  CK     Status: Abnormal   Collection Time: 04/19/18  3:09 PM  Result Value Ref Range   Total CK 500 (H) 49 - 397  U/L  Urinalysis, Complete w Microscopic     Status: Abnormal   Collection Time: 04/19/18  5:45 PM  Result Value Ref Range   Color, Urine AMBER (A) YELLOW   APPearance HAZY (A) CLEAR   Specific Gravity, Urine 1.026 1.005 - 1.030   pH 5.0 5.0 - 8.0   Glucose, UA NEGATIVE NEGATIVE mg/dL   Hgb urine dipstick NEGATIVE NEGATIVE   Bilirubin Urine SMALL (A) NEGATIVE   Ketones, ur NEGATIVE NEGATIVE mg/dL   Protein, ur 30 (A) NEGATIVE mg/dL   Nitrite NEGATIVE NEGATIVE   Leukocytes, UA NEGATIVE NEGATIVE   RBC / HPF 0-5 0 - 5 RBC/hpf   WBC, UA 0-5 0 - 5 WBC/hpf   Bacteria, UA NONE SEEN NONE SEEN   Squamous Epithelial / LPF 0-5 0 - 5   Mucus PRESENT    Hyaline Casts, UA PRESENT    ____________________________________________  EKG My review and personal interpretation at Time: 15:03   Indication: chest pain  Rate: 90  Rhythm: sinsu Axis: normal  Other: normal intervals, no stemi ____________________________________________  RADIOLOGY  I personally reviewed all radiographic images ordered to evaluate for the above acute complaints and reviewed radiology reports and findings.  These findings were personally discussed with the patient.  Please see medical record for radiology report.  ____________________________________________   PROCEDURES  Procedure(s) performed:  Procedures    Critical Care performed:  no ____________________________________________   INITIAL IMPRESSION / ASSESSMENT AND PLAN / ED COURSE  Pertinent labs & imaging results that were available during my care of the patient were reviewed by me and considered in my medical decision making (see chart for details).   DDX: dehydration, rhabdo, aki, heat illness, pna, bronchitis, acs  CHRISTPHOR GROFT is a 52 y.o. who presents to the ED with symptoms as described above.  Patient nontoxic-appearing but does appear clinically dehydrated.  EKG shows no evidence of acute ischemia blood work does show mild acute on chronic kidney disease and I do suspect some component of significant dehydration therefore will start IV fluids.  Will restart his home pain medication as well as treat for his headache which I also think is likely secondary to dehydration with Compazine both for its anti-headache benefits but also for antinausea this is also complaining of some nausea.  Does not seem clinically consistent with ACS, dissection, infectious process.  Clinical Course as of Apr 19 1832  Tue Apr 19, 2018  1551 RDW: 14.4 [PR]  1741 CK is mildly elevated at 500.  Do suspect some component of heat induced illness with some dehydration.  Currently awaiting urinalysis.  Pain currently controlled.   [PR]  1744 Patient ambulated with steady gait.  Does provide a urine.   [PR]  1829 Patient reassessed.  Symptoms have resolved.  Received IV fluids and is now tolerating oral hydration.  States he does feel like he got over heated and dehydrated and discussed methods for which he can prevent this in the future.  Point I do believe he stable and appropriate for outpatient follow-up.   [PR]    Clinical Course User Index [PR] Merlyn Lot, MD     As part of my medical decision making, I reviewed the following data within the [place] notes reviewed and incorporated, Labs reviewed, notes from prior ED visits and [place] Controlled  Substance Database   ____________________________________________   FINAL CLINICAL IMPRESSION(S) / ED DIAGNOSES  Final diagnoses:  Dehydration after exertion  Nausea  Nonintractable headache, unspecified  chronicity pattern, unspecified headache type      NEW MEDICATIONS STARTED DURING THIS VISIT:  New Prescriptions   No medications on file     Note:  This document was prepared using Dragon voice recognition software and may include unintentional dictation errors.    Merlyn Lot, MD 04/19/18 [phone]

## 2019-03-27 ENCOUNTER — Encounter: Payer: Self-pay | Admitting: Emergency Medicine

## 2019-03-27 ENCOUNTER — Emergency Department
Admission: EM | Admit: 2019-03-27 | Discharge: 2019-03-27 | Disposition: A | Discharge status: Home / Self Care | Payer: Medicaid Other | Attending: Emergency Medicine | Admitting: Emergency Medicine

## 2019-03-27 ENCOUNTER — Other Ambulatory Visit: Payer: Self-pay

## 2019-03-27 DIAGNOSIS — Y929 Unspecified place or not applicable: Secondary | ICD-10-CM | POA: Diagnosis not present

## 2019-03-27 DIAGNOSIS — Z23 Encounter for immunization: Secondary | ICD-10-CM | POA: Insufficient documentation

## 2019-03-27 DIAGNOSIS — Y990 Civilian activity done for income or pay: Secondary | ICD-10-CM | POA: Insufficient documentation

## 2019-03-27 DIAGNOSIS — S8992XA Unspecified injury of left lower leg, initial encounter: Secondary | ICD-10-CM | POA: Diagnosis present

## 2019-03-27 DIAGNOSIS — Y9389 Activity, other specified: Secondary | ICD-10-CM | POA: Diagnosis not present

## 2019-03-27 DIAGNOSIS — S81812A Laceration without foreign body, left lower leg, initial encounter: Secondary | ICD-10-CM | POA: Insufficient documentation

## 2019-03-27 DIAGNOSIS — F1210 Cannabis abuse, uncomplicated: Secondary | ICD-10-CM | POA: Diagnosis not present

## 2019-03-27 DIAGNOSIS — W298XXA Contact with other powered powered hand tools and household machinery, initial encounter: Secondary | ICD-10-CM | POA: Insufficient documentation

## 2019-03-27 MED ORDER — LIDOCAINE HCL (PF) 1 % IJ SOLN
10.0000 mL | Freq: Once | INTRAMUSCULAR | Status: AC
  Administered 2019-03-27: 10 mL
  Filled 2019-03-27: qty 10

## 2019-03-27 MED ORDER — TETANUS-DIPHTH-ACELL PERTUSSIS 5-2.5-18.5 LF-MCG/0.5 IM SUSP
0.5000 mL | Freq: Once | INTRAMUSCULAR | Status: AC
  Administered 2019-03-27: 0.5 mL via INTRAMUSCULAR
  Filled 2019-03-27: qty 0.5

## 2019-03-27 NOTE — ED Provider Notes (Signed)
[facility] Emergency Department Provider Note   ____________________________________________   First MD Initiated Contact with Patient 03/27/19 [phone]     (approximate)  I have reviewed the triage vital signs and the nursing notes.   HISTORY  Chief Complaint Laceration   HPI Gregory Oconnell is a 53 y.o. male presents to the ED with laceration to his left lower extremity.  Patient states he was at work cutting some brick and the saw slipped cutting him.  Patient is sure that his last tetanus has been over 10 years.  Patient is continued to be ambulatory.  He rates pain as a 5/10.     Past Medical History:  Diagnosis Date  . Arthritis   . GERD (gastroesophageal reflux disease)   . Kidney disease     There are no active problems to display for this patient.   Past Surgical History:  Procedure Laterality Date  . COLONOSCOPY WITH PROPOFOL N/A 02/03/2016   Procedure: COLONOSCOPY WITH PROPOFOL;  Surgeon: Hulen Luster, MD;  Location: [facility] ENDOSCOPY;  Service: Gastroenterology;  Laterality: N/A;  . ESOPHAGOGASTRODUODENOSCOPY (EGD) WITH PROPOFOL N/A 02/03/2016   Procedure: ESOPHAGOGASTRODUODENOSCOPY (EGD) WITH PROPOFOL;  Surgeon: Hulen Luster, MD;  Location: [facility] ENDOSCOPY;  Service: Gastroenterology;  Laterality: N/A;  . KNEE SURGERY      Prior to Admission medications   Medication Sig Start Date End Date Taking? Authorizing Provider  colchicine 0.6 MG tablet Take 1 tablet (0.6 mg total) by mouth 2 (two) times daily as needed. 12/02/16 12/02/17  Carrie Mew, MD  famotidine (PEPCID) 20 MG tablet Take 1 tablet (20 mg total) by mouth 2 (two) times daily. 12/02/16   Carrie Mew, MD  gabapentin (NEURONTIN) 300 MG capsule Take 300 mg by mouth daily.    [provider]  omeprazole (PRILOSEC) 40 MG capsule Take 40 mg by mouth daily.    [provider]    Allergies Patient has no known allergies.  No family history on file.  Social  History Social History   Tobacco Use  . Smoking status: Never Smoker  . Smokeless tobacco: Never Used  Substance Use Topics  . Alcohol use: Yes    Comment: several beers every other week  . Drug use: Yes    Types: Marijuana    Review of Systems Constitutional: No fever/chills Cardiovascular: Denies chest pain. Respiratory: Denies shortness of breath. Musculoskeletal: Left lower leg pain. Skin: Laceration left leg. Neurological: Negative for headaches, focal weakness or numbness. ____________________________________________   PHYSICAL EXAM:  VITAL SIGNS: ED Triage Vitals [03/27/19 0926]  Enc Vitals Group     BP (!) 138/102     Pulse Rate 83     Resp 18     Temp (!) 97.5 F (36.4 C)     Temp Source Oral     SpO2 100 %     Weight 220 lb (99.8 kg)     Height 6\' 3"  (1.905 m)     Head Circumference      Peak Flow      Pain Score 5     Pain Loc      Pain Edu?      Excl. in [place]?    Constitutional: Alert and oriented. Well appearing and in no acute distress. Eyes: Conjunctivae are normal.  Head: Atraumatic. Neck: No stridor.   Cardiovascular: Normal rate, regular rhythm. Grossly normal heart sounds.  Good peripheral circulation. Respiratory: Normal respiratory effort.  No retractions. Lungs CTAB. Musculoskeletal: Left anterior  leg just below midshaft has a laceration horizontally without obvious foreign body or active bleeding.  Patient is able to move lower extremity without any difficulty.  Laceration was explored and there was no foreign body noted.  Laceration was explored and although it was large in length the actual depth of the laceration was less than subcutaneous. Neurologic:  Normal speech and language. No gross focal neurologic deficits are appreciated. No gait instability. Skin:  Skin is warm, dry.  Laceration as noted above. Psychiatric: Mood and affect are normal. Speech and behavior are normal.  ____________________________________________   LABS (all  labs ordered are listed, but only abnormal results are displayed)  Labs Reviewed - No data to display  PROCEDURES  Procedure(s) performed (including Critical Care):  Marland KitchenMarland KitchenLaceration Repair Date/Time: 03/27/2019 10:13 AM Performed by: Johnn Hai, PA-C Authorized by: Johnn Hai, PA-C   Consent:    Consent obtained:  Verbal   Consent given by:  Patient   Risks discussed:  Pain and poor wound healing Anesthesia (see MAR for exact dosages):    Anesthesia method:  Local infiltration   Local anesthetic:  Lidocaine 1% w/o epi Laceration details:    Location:  Leg   Leg location:  L lower leg   Length (cm):  6 Repair type:    Repair type:  Simple Pre-procedure details:    Preparation:  Patient was prepped and draped in usual sterile fashion and imaging obtained to evaluate for foreign bodies Exploration:    Hemostasis achieved with:  Direct pressure   Contaminated: no   Treatment:    Area cleansed with:  Saline   Amount of cleaning:  Standard   Irrigation solution:  Sterile saline   Irrigation volume:  50 ml   Irrigation method:  Syringe   Visualized foreign bodies/material removed: no   Skin repair:    Repair method:  Sutures   Suture size:  4-0   Suture material:  Nylon   Suture technique:  Simple interrupted   Number of sutures:  7 Approximation:    Approximation:  Close Post-procedure details:    Dressing:  Non-adherent dressing   Patient tolerance of procedure:  Tolerated well, no immediate complications     ____________________________________________   INITIAL IMPRESSION / ASSESSMENT AND PLAN / ED COURSE  As part of my medical decision making, I reviewed the following data within the electronic MEDICAL RECORD NUMBER Notes from prior ED visits and [place] Controlled Substance Database   53 year old male presents to the ED with laceration to his left leg after using a brick saw.  Patient was uncertain as to the last time he had a tetanus shot and this was  updated.  Laceration was explored and no foreign body was noted.  Patient had sutures x7 and tolerated this well.  Patient was ambulatory at the time of discharge without any assistance.  He is aware that that he should have these removed in 10 to 12 days.  Also given instructions on how to take care of this.  ____________________________________________   FINAL CLINICAL IMPRESSION(S) / ED DIAGNOSES  Final diagnoses:  Laceration of left lower leg, initial encounter     ED Discharge Orders    None       Note:  This document was prepared using Dragon voice recognition software and may include unintentional dictation errors.    Johnn Hai, PA-C 03/27/19 1259    Earleen Newport, MD 03/27/19 [phone]

## 2019-03-27 NOTE — ED Triage Notes (Signed)
Pt states he was using a saw and cut his LLE this morning. Bleeding is controlled.

## 2019-03-27 NOTE — Discharge Instructions (Signed)
Return to the emergency department in 10 to 12 days for suture removal or you may go to an urgent care or family doctor..  Watch area for any signs of infection.  Clean daily with mild soap and water.  Elevate your leg anytime you see swelling.  You may take Tylenol as needed for pain.  Return to the emergency department sooner if you see any signs of infection.

## 2019-03-27 NOTE — ED Notes (Signed)
See triage note  Stats he was cutting some brick at home  Saw slipped  Laceration noted to left lower leg  Laceration approx 2 in   Bleeding controlled

## 2019-07-24 ENCOUNTER — Emergency Department
Admission: EM | Admit: 2019-07-24 | Discharge: 2019-07-24 | Discharge status: Against Medical Advice | Payer: Medicaid Other | Attending: Emergency Medicine | Admitting: Emergency Medicine

## 2019-07-24 NOTE — ED Triage Notes (Signed)
Pt called for triage, no response. 

## 2021-08-07 ENCOUNTER — Other Ambulatory Visit: Payer: Self-pay

## 2021-08-07 ENCOUNTER — Emergency Department: Payer: Medicaid Other

## 2021-08-07 ENCOUNTER — Encounter: Payer: Self-pay | Admitting: *Deleted

## 2021-08-07 ENCOUNTER — Inpatient Hospital Stay
Admission: EM | Admit: 2021-08-07 | Discharge: 2021-08-13 | DRG: 329 | Disposition: A | Discharge status: Home Health | Payer: Medicaid Other | Attending: Surgery | Admitting: Surgery

## 2021-08-07 DIAGNOSIS — K651 Peritoneal abscess: Secondary | ICD-10-CM

## 2021-08-07 DIAGNOSIS — K219 Gastro-esophageal reflux disease without esophagitis: Secondary | ICD-10-CM | POA: Diagnosis present

## 2021-08-07 DIAGNOSIS — Y92234 Operating room of hospital as the place of occurrence of the external cause: Secondary | ICD-10-CM | POA: Diagnosis not present

## 2021-08-07 DIAGNOSIS — S3723XA Laceration of bladder, initial encounter: Secondary | ICD-10-CM | POA: Diagnosis not present

## 2021-08-07 DIAGNOSIS — G8929 Other chronic pain: Secondary | ICD-10-CM | POA: Diagnosis present

## 2021-08-07 DIAGNOSIS — M549 Dorsalgia, unspecified: Secondary | ICD-10-CM | POA: Diagnosis present

## 2021-08-07 DIAGNOSIS — K5720 Diverticulitis of large intestine with perforation and abscess without bleeding: Principal | ICD-10-CM

## 2021-08-07 DIAGNOSIS — K5792 Diverticulitis of intestine, part unspecified, without perforation or abscess without bleeding: Secondary | ICD-10-CM

## 2021-08-07 DIAGNOSIS — K650 Generalized (acute) peritonitis: Secondary | ICD-10-CM | POA: Diagnosis present

## 2021-08-07 DIAGNOSIS — Y838 Other surgical procedures as the cause of abnormal reaction of the patient, or of later complication, without mention of misadventure at the time of the procedure: Secondary | ICD-10-CM | POA: Diagnosis not present

## 2021-08-07 DIAGNOSIS — Z20822 Contact with and (suspected) exposure to covid-19: Secondary | ICD-10-CM | POA: Diagnosis present

## 2021-08-07 DIAGNOSIS — Z79899 Other long term (current) drug therapy: Secondary | ICD-10-CM

## 2021-08-07 DIAGNOSIS — E871 Hypo-osmolality and hyponatremia: Secondary | ICD-10-CM | POA: Diagnosis present

## 2021-08-07 DIAGNOSIS — Z8711 Personal history of peptic ulcer disease: Secondary | ICD-10-CM

## 2021-08-07 HISTORY — DX: Other chronic pain: G89.29

## 2021-08-07 LAB — COMPREHENSIVE METABOLIC PANEL
ALT: 28 U/L (ref 0–44)
AST: 36 U/L (ref 15–41)
Albumin: 2.3 g/dL — ABNORMAL LOW (ref 3.5–5.0)
Alkaline Phosphatase: 99 U/L (ref 38–126)
Anion gap: 13 (ref 5–15)
BUN: 16 mg/dL (ref 6–20)
CO2: 23 mmol/L (ref 22–32)
Calcium: 7.8 mg/dL — ABNORMAL LOW (ref 8.9–10.3)
Chloride: 90 mmol/L — ABNORMAL LOW (ref 98–111)
Creatinine, Ser: 0.68 mg/dL (ref 0.61–1.24)
GFR, Estimated: >60 mL/min (ref >60)
Glucose, Bld: 109 mg/dL — ABNORMAL HIGH (ref 70–99)
Potassium: 3.5 mmol/L (ref 3.5–5.1)
Sodium: 126 mmol/L — ABNORMAL LOW (ref 135–145)
Total Bilirubin: 1.3 mg/dL — ABNORMAL HIGH (ref 0.3–1.2)
Total Protein: 5.8 g/dL — ABNORMAL LOW (ref 6.5–8.1)

## 2021-08-07 LAB — CBC
HCT: 36.7 % — ABNORMAL LOW (ref 39.0–52.0)
Hemoglobin: 12.5 g/dL — ABNORMAL LOW (ref 13.0–17.0)
MCH: 29.2 pg (ref 26.0–34.0)
MCHC: 34.1 g/dL (ref 30.0–36.0)
MCV: 85.7 fL (ref 80.0–100.0)
Platelets: 437 10*3/uL — ABNORMAL HIGH (ref 150–400)
RBC: 4.28 MIL/uL (ref 4.22–5.81)
RDW: 12.6 % (ref 11.5–15.5)
WBC: 34 10*3/uL — ABNORMAL HIGH (ref 4.0–10.5)
nRBC: 0 % (ref 0.0–0.2)

## 2021-08-07 LAB — LIPASE, BLOOD: Lipase: 23 U/L (ref 11–51)

## 2021-08-07 MED ORDER — MORPHINE SULFATE (PF) 4 MG/ML IV SOLN
4.0000 mg | Freq: Once | INTRAVENOUS | Status: AC
  Administered 2021-08-07: 4 mg via INTRAVENOUS
  Filled 2021-08-07: qty 1

## 2021-08-07 MED ORDER — ONDANSETRON HCL 4 MG/2ML IJ SOLN
4.0000 mg | Freq: Once | INTRAMUSCULAR | Status: AC
  Administered 2021-08-07: 4 mg via INTRAVENOUS
  Filled 2021-08-07: qty 2

## 2021-08-07 MED ORDER — IOHEXOL 350 MG/ML SOLN
100.0000 mL | Freq: Once | INTRAVENOUS | Status: AC | PRN
  Administered 2021-08-07: 100 mL via INTRAVENOUS

## 2021-08-07 MED ORDER — SODIUM CHLORIDE 0.9 % IV BOLUS
1000.0000 mL | Freq: Once | INTRAVENOUS | Status: AC
  Administered 2021-08-07: 1000 mL via INTRAVENOUS

## 2021-08-07 NOTE — ED Triage Notes (Signed)
Pt brought in via ems from home with abd pain.  Pt reports diarrhea today. Pt has back pain   pt reports dysuria.  Pt alert.

## 2021-08-07 NOTE — ED Provider Notes (Signed)
[facility]  ____________________________________________   Event Date/Time   First MD Initiated Contact with Patient 08/07/21 2251     (approximate)  I have reviewed the triage vital signs and the nursing notes.   HISTORY  Chief Complaint Abdominal Pain    HPI ARLES RUMBOLD is a 55 y.o. male pmh GERD, arthritis who presents with abdominal pain.  Who presents with abdominal pain.  Symptoms started about 4 days ago.  Pain is located the left lower quadrant.  Has been coming more severe.  Denies associated nausea vomiting.  Has had subjective fevers at home.  Has had constipation, taking milk of magnesia and has had several bowel movements.  Last was yesterday.  Patient describes severe 10 out of 10 pain currently.         Past Medical History:  Diagnosis Date   Arthritis    GERD (gastroesophageal reflux disease)    Kidney disease     There are no problems to display for this patient.   Past Surgical History:  Procedure Laterality Date   COLONOSCOPY WITH PROPOFOL N/A 02/03/2016   Procedure: COLONOSCOPY WITH PROPOFOL;  Surgeon: Hulen Luster, MD;  Location: [facility] ENDOSCOPY;  Service: Gastroenterology;  Laterality: N/A;   ESOPHAGOGASTRODUODENOSCOPY (EGD) WITH PROPOFOL N/A 02/03/2016   Procedure: ESOPHAGOGASTRODUODENOSCOPY (EGD) WITH PROPOFOL;  Surgeon: Hulen Luster, MD;  Location: [facility] ENDOSCOPY;  Service: Gastroenterology;  Laterality: N/A;   KNEE SURGERY      Prior to Admission medications   Medication Sig Start Date End Date Taking? Authorizing Provider  colchicine 0.6 MG tablet Take 1 tablet (0.6 mg total) by mouth 2 (two) times daily as needed. 12/02/16 12/02/17  Carrie Mew, MD  famotidine (PEPCID) 20 MG tablet Take 1 tablet (20 mg total) by mouth 2 (two) times daily. 12/02/16   Carrie Mew, MD  gabapentin (NEURONTIN) 300 MG capsule Take 300 mg by mouth daily.    [provider]  omeprazole (PRILOSEC) 40 MG capsule Take 40 mg by  mouth daily.    [provider]    Allergies Patient has no known allergies.  No family history on file.  Social History Social History   Tobacco Use   Smoking status: Never   Smokeless tobacco: Never  Substance Use Topics   Alcohol use: Not Currently    Comment: several beers every other week   Drug use: Yes    Types: Marijuana    Review of Systems   Review of Systems  Constitutional:  Positive for appetite change, chills and fever.  Respiratory:  Negative for shortness of breath.   Cardiovascular:  Negative for chest pain.  Gastrointestinal:  Positive for abdominal pain and constipation. Negative for diarrhea, nausea and vomiting.  Genitourinary:  Positive for dysuria.  All other systems reviewed and are negative.  Physical Exam Updated Vital Signs BP 124/84 (BP Location: Left Arm)   Pulse (!) 102   Temp 98.8 F (37.1 C) (Oral)   Resp 20   Ht 6' (1.829 m)   Wt 90.7 kg   SpO2 98%   BMI 27.12 kg/m   Physical Exam Vitals and nursing note reviewed.  Constitutional:      General: He is in acute distress.     Appearance: Normal appearance.     Comments: Patient appears uncomfortable  HENT:     Head: Normocephalic and atraumatic.  Eyes:     General: No scleral icterus.    Conjunctiva/sclera: Conjunctivae normal.  Pulmonary:  Effort: Pulmonary effort is normal. No respiratory distress.     Breath sounds: Normal breath sounds. No wheezing.  Abdominal:     General: Abdomen is flat.     Tenderness: There is abdominal tenderness.     Comments: Faint tenderness to palpation in the bilateral lower quadrants, left greater than right with guarding  Musculoskeletal:        General: No deformity or signs of injury.     Cervical back: Normal range of motion.  Skin:    General: Skin is warm and dry.     Coloration: Skin is not jaundiced or pale.  Neurological:     General: No focal deficit present.     Mental Status: He is alert and oriented to person,  place, and time. Mental status is at baseline.  Psychiatric:        Mood and Affect: Mood normal.        Behavior: Behavior normal.     LABS (all labs ordered are listed, but only abnormal results are displayed)  Labs Reviewed  COMPREHENSIVE METABOLIC PANEL - Abnormal; Notable for the following components:      Result Value   Sodium 126 (*)    Chloride 90 (*)    Glucose, Bld 109 (*)    Calcium 7.8 (*)    Total Protein 5.8 (*)    Albumin 2.3 (*)    Total Bilirubin 1.3 (*)    All other components within normal limits  CBC - Abnormal; Notable for the following components:   WBC 34.0 (*)    Hemoglobin 12.5 (*)    HCT 36.7 (*)    Platelets 437 (*)    All other components within normal limits  LIPASE, BLOOD  URINALYSIS, ROUTINE W REFLEX MICROSCOPIC   ____________________________________________  EKG  NSR, nml axis, nml intervals, no acute ischemic changes  ____________________________________________  RADIOLOGY Almeta Monas, personally viewed and evaluated these images (plain radiographs) as part of my medical decision making, as well as reviewing the written report by the radiologist.  ED MD interpretation: I reviewed the CT of the abdomen pelvis which shows a likely perforated diverticulitis although no free air with multiple abscesses    ____________________________________________   PROCEDURES  Procedure(s) performed (including Critical Care):  Procedures   ____________________________________________   INITIAL IMPRESSION / ASSESSMENT AND PLAN / ED COURSE     Patient is a 55 year old male presents with left lower quadrant abdominal pain for 4 days.  On exam he appears uncomfortable.  Abdominal exam is concerning for localized peritoneal signs in the left lower quadrant.  Vital signs within normal limits.  Labs are notable for significant leukocytosis to 34, hyponatremia to 126.  Will prioritize CT.  Fluids pain control.  Anticipate surgical  consult.  CT shows diverticulitis with multi focal abscesses throughout the abdomen with free fluid tracking up to the liver.  Discussed with Dr. Hampton Abbot with general surgery who feels that patient will not be amenable to IR drainage only and plans to take the patient to the OR tonight.  Patient given Zosyn, blood cultures obtained.  Remained hemodynamically stable throughout his time in the ED with normal lactate. ____________________________________________   FINAL CLINICAL IMPRESSION(S) / ED DIAGNOSES  Final diagnoses:  None     ED Discharge Orders     None        Note:  This document was prepared using Dragon voice recognition software and may include unintentional dictation errors.    Rada Hay,  MD 08/08/21 0121

## 2021-08-07 NOTE — ED Notes (Signed)
Patient placed on cardiac monitor r/t sx & lab values, VS updated. MD aware of pain. Call light in reach.

## 2021-08-07 NOTE — ED Notes (Signed)
Pt attempts to have bowel movement, unsuccessful. CT notified that patient now ready.

## 2021-08-08 ENCOUNTER — Encounter: Payer: Self-pay | Admitting: Surgery

## 2021-08-08 ENCOUNTER — Inpatient Hospital Stay: Payer: Medicaid Other | Admitting: Anesthesiology

## 2021-08-08 ENCOUNTER — Encounter
Admission: EM | Disposition: A | Discharge status: Home Health | Payer: Self-pay | Source: Home / Self Care | Attending: Surgery

## 2021-08-08 DIAGNOSIS — Y838 Other surgical procedures as the cause of abnormal reaction of the patient, or of later complication, without mention of misadventure at the time of the procedure: Secondary | ICD-10-CM | POA: Diagnosis not present

## 2021-08-08 DIAGNOSIS — G8929 Other chronic pain: Secondary | ICD-10-CM | POA: Diagnosis present

## 2021-08-08 DIAGNOSIS — K219 Gastro-esophageal reflux disease without esophagitis: Secondary | ICD-10-CM | POA: Diagnosis present

## 2021-08-08 DIAGNOSIS — Z8711 Personal history of peptic ulcer disease: Secondary | ICD-10-CM | POA: Diagnosis not present

## 2021-08-08 DIAGNOSIS — K650 Generalized (acute) peritonitis: Secondary | ICD-10-CM | POA: Diagnosis present

## 2021-08-08 DIAGNOSIS — Y92234 Operating room of hospital as the place of occurrence of the external cause: Secondary | ICD-10-CM | POA: Diagnosis not present

## 2021-08-08 DIAGNOSIS — K651 Peritoneal abscess: Secondary | ICD-10-CM | POA: Diagnosis present

## 2021-08-08 DIAGNOSIS — K5720 Diverticulitis of large intestine with perforation and abscess without bleeding: Principal | ICD-10-CM

## 2021-08-08 DIAGNOSIS — S3723XA Laceration of bladder, initial encounter: Secondary | ICD-10-CM | POA: Diagnosis not present

## 2021-08-08 DIAGNOSIS — Z20822 Contact with and (suspected) exposure to covid-19: Secondary | ICD-10-CM | POA: Diagnosis present

## 2021-08-08 DIAGNOSIS — K5792 Diverticulitis of intestine, part unspecified, without perforation or abscess without bleeding: Secondary | ICD-10-CM | POA: Diagnosis present

## 2021-08-08 DIAGNOSIS — Z79899 Other long term (current) drug therapy: Secondary | ICD-10-CM | POA: Diagnosis not present

## 2021-08-08 DIAGNOSIS — E871 Hypo-osmolality and hyponatremia: Secondary | ICD-10-CM | POA: Diagnosis present

## 2021-08-08 DIAGNOSIS — M549 Dorsalgia, unspecified: Secondary | ICD-10-CM | POA: Diagnosis present

## 2021-08-08 HISTORY — PX: BLADDER REPAIR: SHX6721

## 2021-08-08 HISTORY — PX: LAPAROTOMY: SHX154

## 2021-08-08 HISTORY — PX: COLECTOMY WITH COLOSTOMY CREATION/HARTMANN PROCEDURE: SHX6598

## 2021-08-08 LAB — BASIC METABOLIC PANEL
Anion gap: 10 (ref 5–15)
BUN: 21 mg/dL — ABNORMAL HIGH (ref 6–20)
CO2: 22 mmol/L (ref 22–32)
Calcium: 7.5 mg/dL — ABNORMAL LOW (ref 8.9–10.3)
Chloride: 96 mmol/L — ABNORMAL LOW (ref 98–111)
Creatinine, Ser: 0.7 mg/dL (ref 0.61–1.24)
GFR, Estimated: >60 mL/min (ref >60)
Glucose, Bld: 196 mg/dL — ABNORMAL HIGH (ref 70–99)
Potassium: 4.2 mmol/L (ref 3.5–5.1)
Sodium: 128 mmol/L — ABNORMAL LOW (ref 135–145)

## 2021-08-08 LAB — CBC
HCT: 34.3 % — ABNORMAL LOW (ref 39.0–52.0)
Hemoglobin: 12 g/dL — ABNORMAL LOW (ref 13.0–17.0)
MCH: 30 pg (ref 26.0–34.0)
MCHC: 35 g/dL (ref 30.0–36.0)
MCV: 85.8 fL (ref 80.0–100.0)
Platelets: 438 10*3/uL — ABNORMAL HIGH (ref 150–400)
RBC: 4 MIL/uL — ABNORMAL LOW (ref 4.22–5.81)
RDW: 12.7 % (ref 11.5–15.5)
WBC: 33.9 10*3/uL — ABNORMAL HIGH (ref 4.0–10.5)
nRBC: 0 % (ref 0.0–0.2)

## 2021-08-08 LAB — RESP PANEL BY RT-PCR (FLU A&B, COVID) ARPGX2
Influenza A by PCR: NEGATIVE
Influenza B by PCR: NEGATIVE
SARS Coronavirus 2 by RT PCR: NEGATIVE

## 2021-08-08 LAB — TYPE AND SCREEN
ABO/RH(D): O POS
Antibody Screen: NEGATIVE

## 2021-08-08 LAB — APTT: aPTT: 28 seconds (ref 24–36)

## 2021-08-08 LAB — PROTIME-INR
INR: 1.2 (ref 0.8–1.2)
Prothrombin Time: 15.4 seconds — ABNORMAL HIGH (ref 11.4–15.2)

## 2021-08-08 LAB — HIV ANTIBODY (ROUTINE TESTING W REFLEX): HIV Screen 4th Generation wRfx: NONREACTIVE

## 2021-08-08 LAB — ABO/RH: ABO/RH(D): O POS

## 2021-08-08 LAB — MAGNESIUM: Magnesium: 1.8 mg/dL (ref 1.7–2.4)

## 2021-08-08 LAB — LACTIC ACID, PLASMA: Lactic Acid, Venous: 1.6 mmol/L (ref 0.5–1.9)

## 2021-08-08 SURGERY — REPAIR, BLADDER
Anesthesia: General | Site: Bladder

## 2021-08-08 MED ORDER — DEXAMETHASONE SODIUM PHOSPHATE 10 MG/ML IJ SOLN
INTRAMUSCULAR | Status: AC
  Filled 2021-08-08: qty 1

## 2021-08-08 MED ORDER — HYDROMORPHONE HCL 1 MG/ML IJ SOLN
INTRAMUSCULAR | Status: DC | PRN
  Administered 2021-08-08 (×2): 1 mg via INTRAVENOUS

## 2021-08-08 MED ORDER — BUPIVACAINE LIPOSOME 1.3 % IJ SUSP
INTRAMUSCULAR | Status: AC
  Filled 2021-08-08: qty 20

## 2021-08-08 MED ORDER — CHLORHEXIDINE GLUCONATE CLOTH 2 % EX PADS
6.0000 | MEDICATED_PAD | Freq: Every day | CUTANEOUS | Status: DC
  Administered 2021-08-08 – 2021-08-13 (×6): 6 via TOPICAL

## 2021-08-08 MED ORDER — PIPERACILLIN-TAZOBACTAM 3.375 G IVPB: 3.3750 g | Freq: Three times a day (TID) | INTRAVENOUS | Status: DC

## 2021-08-08 MED ORDER — FENTANYL CITRATE (PF) 100 MCG/2ML IJ SOLN
INTRAMUSCULAR | Status: DC | PRN
  Administered 2021-08-08 (×4): 50 ug via INTRAVENOUS
  Administered 2021-08-08: 100 ug via INTRAVENOUS
  Administered 2021-08-08: 50 ug via INTRAVENOUS

## 2021-08-08 MED ORDER — HYDROMORPHONE HCL 1 MG/ML IJ SOLN
0.5000 mg | INTRAMUSCULAR | Status: DC | PRN
  Administered 2021-08-08 – 2021-08-09 (×10): 1 mg via INTRAVENOUS
  Filled 2021-08-08 (×10): qty 1

## 2021-08-08 MED ORDER — GLYCOPYRROLATE 0.2 MG/ML IJ SOLN
INTRAMUSCULAR | Status: DC | PRN
  Administered 2021-08-08: .2 mg via INTRAVENOUS

## 2021-08-08 MED ORDER — SODIUM CHLORIDE 0.9 % IV SOLN: INTRAVENOUS | Status: DC | PRN

## 2021-08-08 MED ORDER — DEXMEDETOMIDINE HCL IN NACL 200 MCG/50ML IV SOLN
INTRAVENOUS | Status: AC
  Filled 2021-08-08: qty 50

## 2021-08-08 MED ORDER — HYDROMORPHONE HCL 1 MG/ML IJ SOLN
INTRAMUSCULAR | Status: AC
  Filled 2021-08-08: qty 1

## 2021-08-08 MED ORDER — FENTANYL CITRATE (PF) 100 MCG/2ML IJ SOLN
INTRAMUSCULAR | Status: AC
  Filled 2021-08-08: qty 2

## 2021-08-08 MED ORDER — ACETAMINOPHEN 10 MG/ML IV SOLN
INTRAVENOUS | Status: AC
  Filled 2021-08-08: qty 100

## 2021-08-08 MED ORDER — DEXAMETHASONE SODIUM PHOSPHATE 10 MG/ML IJ SOLN
INTRAMUSCULAR | Status: DC | PRN
Start: 2021-08-08 — End: 2021-08-08
  Administered 2021-08-08: 10 mg via INTRAVENOUS

## 2021-08-08 MED ORDER — PIPERACILLIN-TAZOBACTAM 3.375 G IVPB 30 MIN
3.3750 g | Freq: Once | INTRAVENOUS | Status: AC
  Administered 2021-08-08: 3.375 g via INTRAVENOUS
  Filled 2021-08-08: qty 50

## 2021-08-08 MED ORDER — PROPOFOL 10 MG/ML IV BOLUS
INTRAVENOUS | Status: AC
  Filled 2021-08-08: qty 20

## 2021-08-08 MED ORDER — ALBUMIN HUMAN 5 % IV SOLN: INTRAVENOUS | Status: DC | PRN

## 2021-08-08 MED ORDER — METHYLENE BLUE 0.5 % INJ SOLN
INTRAVENOUS | Status: DC | PRN
  Administered 2021-08-08: 350 mL

## 2021-08-08 MED ORDER — ONDANSETRON HCL 4 MG/2ML IJ SOLN: 4.0000 mg | Freq: Four times a day (QID) | INTRAMUSCULAR | Status: DC | PRN

## 2021-08-08 MED ORDER — 0.9 % SODIUM CHLORIDE (POUR BTL) OPTIME
TOPICAL | Status: DC | PRN
  Administered 2021-08-08: 2000 mL

## 2021-08-08 MED ORDER — FENTANYL CITRATE (PF) 250 MCG/5ML IJ SOLN
INTRAMUSCULAR | Status: AC
  Filled 2021-08-08: qty 5

## 2021-08-08 MED ORDER — PROMETHAZINE HCL 25 MG/ML IJ SOLN
INTRAMUSCULAR | Status: AC
  Filled 2021-08-08: qty 1

## 2021-08-08 MED ORDER — DEXMEDETOMIDINE (PRECEDEX) IN NS 20 MCG/5ML (4 MCG/ML) IV SYRINGE
PREFILLED_SYRINGE | INTRAVENOUS | Status: DC | PRN
  Administered 2021-08-08: 8 ug via INTRAVENOUS
  Administered 2021-08-08: 12 ug via INTRAVENOUS

## 2021-08-08 MED ORDER — ALBUMIN HUMAN 5 % IV SOLN
INTRAVENOUS | Status: AC
  Filled 2021-08-08: qty 250

## 2021-08-08 MED ORDER — SUCCINYLCHOLINE CHLORIDE 200 MG/10ML IV SOSY
PREFILLED_SYRINGE | INTRAVENOUS | Status: DC | PRN
  Administered 2021-08-08: 100 mg via INTRAVENOUS

## 2021-08-08 MED ORDER — SODIUM CHLORIDE (PF) 0.9 % IJ SOLN
INTRAMUSCULAR | Status: DC | PRN
  Administered 2021-08-08: 80 mL via INTRAMUSCULAR

## 2021-08-08 MED ORDER — ROCURONIUM BROMIDE 10 MG/ML (PF) SYRINGE
PREFILLED_SYRINGE | INTRAVENOUS | Status: AC
  Filled 2021-08-08: qty 10

## 2021-08-08 MED ORDER — ONDANSETRON HCL 4 MG/2ML IJ SOLN
INTRAMUSCULAR | Status: DC | PRN
  Administered 2021-08-08: 4 mg via INTRAVENOUS

## 2021-08-08 MED ORDER — ENOXAPARIN SODIUM 40 MG/0.4ML IJ SOSY
40.0000 mg | PREFILLED_SYRINGE | INTRAMUSCULAR | Status: DC
  Administered 2021-08-10 – 2021-08-13 (×4): 40 mg via SUBCUTANEOUS
  Filled 2021-08-08 (×4): qty 0.4

## 2021-08-08 MED ORDER — MIDAZOLAM HCL 2 MG/2ML IJ SOLN
INTRAMUSCULAR | Status: DC | PRN
  Administered 2021-08-08: 2 mg via INTRAVENOUS

## 2021-08-08 MED ORDER — KETAMINE HCL 50 MG/5ML IJ SOSY
PREFILLED_SYRINGE | INTRAMUSCULAR | Status: AC
  Filled 2021-08-08: qty 5

## 2021-08-08 MED ORDER — ACETAMINOPHEN 10 MG/ML IV SOLN: 1000.0000 mg | Freq: Once | INTRAVENOUS | Status: DC | PRN

## 2021-08-08 MED ORDER — BUPIVACAINE-EPINEPHRINE (PF) 0.25% -1:200000 IJ SOLN
INTRAMUSCULAR | Status: AC
  Filled 2021-08-08: qty 30

## 2021-08-08 MED ORDER — METHYLENE BLUE 0.5 % INJ SOLN
INTRAVENOUS | Status: AC
  Filled 2021-08-08: qty 10

## 2021-08-08 MED ORDER — PANTOPRAZOLE SODIUM 40 MG IV SOLR
40.0000 mg | Freq: Every day | INTRAVENOUS | Status: DC
  Administered 2021-08-08 – 2021-08-12 (×5): 40 mg via INTRAVENOUS
  Filled 2021-08-08 (×5): qty 40

## 2021-08-08 MED ORDER — PROMETHAZINE HCL 25 MG/ML IJ SOLN
6.2500 mg | INTRAMUSCULAR | Status: DC | PRN
Start: 2021-08-08 — End: 2021-08-08
  Administered 2021-08-08: 6.25 mg via INTRAVENOUS

## 2021-08-08 MED ORDER — ACETAMINOPHEN 10 MG/ML IV SOLN
INTRAVENOUS | Status: DC | PRN
  Administered 2021-08-08: 1000 mg via INTRAVENOUS

## 2021-08-08 MED ORDER — SEVOFLURANE IN SOLN
RESPIRATORY_TRACT | Status: AC
  Filled 2021-08-08: qty 250

## 2021-08-08 MED ORDER — KETAMINE HCL 50 MG/ML IJ SOLN
INTRAMUSCULAR | Status: DC | PRN
  Administered 2021-08-08 (×3): 10 mg via INTRAVENOUS
  Administered 2021-08-08: 20 mg via INTRAVENOUS

## 2021-08-08 MED ORDER — LIDOCAINE HCL (PF) 2 % IJ SOLN
INTRAMUSCULAR | Status: AC
  Filled 2021-08-08: qty 5

## 2021-08-08 MED ORDER — LACTATED RINGERS IV SOLN
125.0000 mL/h | INTRAVENOUS | Status: DC
  Administered 2021-08-08: 125 mL/h via INTRAVENOUS

## 2021-08-08 MED ORDER — SODIUM CHLORIDE 0.9 % IV SOLN: INTRAVENOUS | Status: DC

## 2021-08-08 MED ORDER — PIPERACILLIN-TAZOBACTAM 3.375 G IVPB
3.3750 g | Freq: Three times a day (TID) | INTRAVENOUS | Status: DC
  Administered 2021-08-08 – 2021-08-13 (×15): 3.375 g via INTRAVENOUS
  Filled 2021-08-08 (×16): qty 50

## 2021-08-08 MED ORDER — SUGAMMADEX SODIUM 200 MG/2ML IV SOLN
INTRAVENOUS | Status: DC | PRN
  Administered 2021-08-08: 200 mg via INTRAVENOUS

## 2021-08-08 MED ORDER — HYDROMORPHONE HCL 1 MG/ML IJ SOLN
1.0000 mg | Freq: Once | INTRAMUSCULAR | Status: AC
  Administered 2021-08-08: 1 mg via INTRAVENOUS
  Filled 2021-08-08: qty 1

## 2021-08-08 MED ORDER — POLYETHYLENE GLYCOL 3350 17 G PO PACK: 17.0000 g | PACK | Freq: Every day | ORAL | Status: DC | PRN

## 2021-08-08 MED ORDER — HYDROMORPHONE HCL 1 MG/ML IJ SOLN: 0.2500 mg | INTRAMUSCULAR | Status: DC | PRN

## 2021-08-08 MED ORDER — ROCURONIUM BROMIDE 100 MG/10ML IV SOLN
INTRAVENOUS | Status: DC | PRN
  Administered 2021-08-08: 50 mg via INTRAVENOUS
  Administered 2021-08-08 (×2): 25 mg via INTRAVENOUS
  Administered 2021-08-08: 30 mg via INTRAVENOUS

## 2021-08-08 MED ORDER — MIDAZOLAM HCL 2 MG/2ML IJ SOLN
INTRAMUSCULAR | Status: AC
  Filled 2021-08-08: qty 2

## 2021-08-08 MED ORDER — HYDROMORPHONE HCL 1 MG/ML IJ SOLN: 0.5000 mg | INTRAMUSCULAR | Status: DC | PRN

## 2021-08-08 MED ORDER — ONDANSETRON HCL 4 MG/2ML IJ SOLN
INTRAMUSCULAR | Status: AC
  Filled 2021-08-08: qty 2

## 2021-08-08 MED ORDER — PROPOFOL 10 MG/ML IV BOLUS
INTRAVENOUS | Status: DC | PRN
  Administered 2021-08-08: 200 mg via INTRAVENOUS

## 2021-08-08 MED ORDER — SUCCINYLCHOLINE CHLORIDE 200 MG/10ML IV SOSY
PREFILLED_SYRINGE | INTRAVENOUS | Status: AC
  Filled 2021-08-08: qty 10

## 2021-08-08 MED ORDER — KETOROLAC TROMETHAMINE 30 MG/ML IJ SOLN
30.0000 mg | Freq: Four times a day (QID) | INTRAMUSCULAR | Status: AC
Start: 2021-08-08 — End: 2021-08-13
  Administered 2021-08-08 – 2021-08-13 (×20): 30 mg via INTRAVENOUS
  Filled 2021-08-08 (×20): qty 1

## 2021-08-08 MED ORDER — ONDANSETRON 4 MG PO TBDP: 4.0000 mg | ORAL_TABLET | Freq: Four times a day (QID) | ORAL | Status: DC | PRN

## 2021-08-08 MED ORDER — LIDOCAINE HCL (CARDIAC) PF 100 MG/5ML IV SOSY
PREFILLED_SYRINGE | INTRAVENOUS | Status: DC | PRN
  Administered 2021-08-08: 50 mg via INTRAVENOUS
  Administered 2021-08-08 (×4): 20 mg via INTRAVENOUS

## 2021-08-08 MED ORDER — LACTATED RINGERS IV SOLN: INTRAVENOUS | Status: DC | PRN

## 2021-08-08 MED ORDER — DROPERIDOL 2.5 MG/ML IJ SOLN
0.6250 mg | Freq: Once | INTRAMUSCULAR | Status: DC | PRN
  Filled 2021-08-08: qty 2

## 2021-08-08 SURGICAL SUPPLY — 47 items
BLADE CLIPPER SURG (BLADE) ×4 IMPLANT
BULB RESERV EVAC DRAIN JP 100C (MISCELLANEOUS) ×4 IMPLANT
CHLORAPREP W/TINT 26 (MISCELLANEOUS) ×4 IMPLANT
DRAIN CHANNEL JP 19F (MISCELLANEOUS) ×4 IMPLANT
DRAIN PENROSE 1/4X12 LTX STRL (WOUND CARE) ×4 IMPLANT
DRAPE LAPAROTOMY 100X77 ABD (DRAPES) ×4 IMPLANT
DRSG OPSITE POSTOP 4X12 (GAUZE/BANDAGES/DRESSINGS) ×4 IMPLANT
DRSG TEGADERM 4X10 (GAUZE/BANDAGES/DRESSINGS) IMPLANT
ELECT BLADE 6.5 EXT (BLADE) ×4 IMPLANT
ELECT CAUTERY BLADE TIP 2.5 (TIP) ×4
ELECT REM PT RETURN 9FT ADLT (ELECTROSURGICAL) ×4
ELECTRODE CAUTERY BLDE TIP 2.5 (TIP) ×3 IMPLANT
ELECTRODE REM PT RTRN 9FT ADLT (ELECTROSURGICAL) ×3 IMPLANT
GAUZE 4X4 16PLY ~~LOC~~+RFID DBL (SPONGE) ×4 IMPLANT
GAUZE SPONGE 4X4 12PLY STRL (GAUZE/BANDAGES/DRESSINGS) ×4 IMPLANT
GLOVE SURG SYN 7.0 (GLOVE) ×8 IMPLANT
GLOVE SURG SYN 7.5  E (GLOVE) ×3
GLOVE SURG SYN 7.5 E (GLOVE) ×9 IMPLANT
GOWN STRL REUS W/ TWL LRG LVL3 (GOWN DISPOSABLE) ×6 IMPLANT
GOWN STRL REUS W/TWL LRG LVL3 (GOWN DISPOSABLE) ×2
KIT OSTOMY DRAINABLE 2.75 STR (WOUND CARE) ×4 IMPLANT
LABEL OR SOLS (LABEL) ×4 IMPLANT
MANIFOLD NEPTUNE II (INSTRUMENTS) ×4 IMPLANT
NEEDLE HYPO 22GX1.5 SAFETY (NEEDLE) ×4 IMPLANT
NS IRRIG 1000ML POUR BTL (IV SOLUTION) ×8 IMPLANT
NS IRRIG 500ML POUR BTL (IV SOLUTION) ×8 IMPLANT
PACK BASIN MAJOR ARMC (MISCELLANEOUS) ×4 IMPLANT
RELOAD PROXIMATE 75MM BLUE (ENDOMECHANICALS) ×4 IMPLANT
SEALER TISSUE X1 CVD JAW (INSTRUMENTS) ×4 IMPLANT
SPONGE T-LAP 18X18 ~~LOC~~+RFID (SPONGE) ×16 IMPLANT
STAPLER CVD CUT GN 40 RELOAD (ENDOMECHANICALS) ×4 IMPLANT
STAPLER PROXIMATE 75MM BLUE (STAPLE) ×4 IMPLANT
STAPLER SKIN PROX 35W (STAPLE) ×4 IMPLANT
SUT ETHILON 3-0 FS-10 30 BLK (SUTURE) ×4
SUT PDS AB 1 CT1 36 (SUTURE) ×8 IMPLANT
SUT PROLENE 2 0 SH DA (SUTURE) ×4 IMPLANT
SUT SILK 2 0 (SUTURE)
SUT SILK 2-0 18XBRD TIE 12 (SUTURE) IMPLANT
SUT SILK 3-0 (SUTURE) ×4 IMPLANT
SUT VIC AB 0 CT1 36 (SUTURE) ×8 IMPLANT
SUT VIC AB 3-0 SH 27 (SUTURE) ×1
SUT VIC AB 3-0 SH 27X BRD (SUTURE) ×3 IMPLANT
SUT VIC AB 3-0 SH 8-18 (SUTURE) ×4 IMPLANT
SUTURE EHLN 3-0 FS-10 30 BLK (SUTURE) ×3 IMPLANT
SYR 10ML LL (SYRINGE) ×4 IMPLANT
TRAY FOLEY MTR SLVR 16FR STAT (SET/KITS/TRAYS/PACK) ×4 IMPLANT
WATER STERILE IRR 500ML POUR (IV SOLUTION) ×4 IMPLANT

## 2021-08-08 NOTE — Brief Op Note (Signed)
08/08/2021  7:51 AM  PATIENT:  Radene Knee  55 y.o. male  PRE-OPERATIVE DIAGNOSIS:  perforated diverticulitis  POST-OPERATIVE DIAGNOSIS:  perforated diverticulitis  PROCEDURE:  Procedure(s): EXPLORATORY LAPAROTOMY (N/A) COLECTOMY WITH COLOSTOMY CREATION/HARTMANN PROCEDURE (N/A) BLADDER REPAIR injury (N/A)  SURGEON:  Surgeon(s) and Role:    * Olean Ree, MD - Primary  PHYSICIAN ASSISTANT:  Edison Simon, PA-C   ANESTHESIA:   general  EBL:  200 ml   BLOOD ADMINISTERED:none  DRAINS: Penrose drain in the midline incision and (19 Fr) Blake drain(s) in the pelvis and LLQ    LOCAL MEDICATIONS USED:  BUPIVICAINE   SPECIMEN:  Source of Specimen:  sigmoid colon  DISPOSITION OF SPECIMEN:  PATHOLOGY  COUNTS:  YES  DICTATION: .Dragon Dictation  PLAN OF CARE: Admit to inpatient   PATIENT DISPOSITION:  PACU - hemodynamically stable.   Delay start of Pharmacological VTE agent (>24hrs) due to surgical blood loss or risk of bleeding: yes

## 2021-08-08 NOTE — Anesthesia Postprocedure Evaluation (Signed)
Anesthesia Post Note  Patient: Gregory Oconnell  Procedure(s) Performed: EXPLORATORY LAPAROTOMY (Abdomen) COLECTOMY WITH COLOSTOMY CREATION/HARTMANN PROCEDURE (Abdomen) BLADDER REPAIR injury (Bladder)  Patient location during evaluation: PACU Anesthesia Type: General Level of consciousness: awake and alert, awake and oriented Pain management: pain level controlled Vital Signs Assessment: post-procedure vital signs reviewed and stable Respiratory status: spontaneous breathing, nonlabored ventilation and respiratory function stable Cardiovascular status: blood pressure returned to baseline and stable Postop Assessment: no apparent nausea or vomiting Anesthetic complications: no   No notable events documented.   Last Vitals:  Vitals:   08/08/21 0830 08/08/21 0906  BP: 123/88 123/84  Pulse: 98 (!) 103  Resp: 15   Temp:  (!) 36.3 C  SpO2: 96% 97%    Last Pain:  Vitals:   08/08/21 0906  TempSrc: Oral  PainSc:                  Phill Mutter

## 2021-08-08 NOTE — Anesthesia Procedure Notes (Signed)
Procedure Name: Intubation Date/Time: 08/08/2021 3:17 AM Performed by: Rolla Plate, CRNA Pre-anesthesia Checklist: Patient identified, Patient being monitored, Timeout performed, Emergency Drugs available and Suction available Patient Re-evaluated:Patient Re-evaluated prior to induction Oxygen Delivery Method: Circle system utilized Preoxygenation: Pre-oxygenation with 100% oxygen Induction Type: IV induction and Rapid sequence Laryngoscope Size: Mac and 4 Grade View: Grade I Tube type: Oral Tube size: 7.5 mm Number of attempts: 1 Airway Equipment and Method: Stylet and Video-laryngoscopy Placement Confirmation: ETT inserted through vocal cords under direct vision, positive ETCO2 and breath sounds checked- equal and bilateral Secured at: 22 cm Tube secured with: Tape Dental Injury: Teeth and Oropharynx as per pre-operative assessment

## 2021-08-08 NOTE — H&P (Signed)
Date of Admission:  08/08/2021  Reason for Admission: Acute diverticulitis  History of Present Illness: Gregory Oconnell is a 55 y.o. male presenting with a 6-day history of worsening abdominal pain.  The patient reports that his pain started around 08/02/2021 but then on 08/04/2021, the pain got worse and has been worsening ever since.  Patient reports associated nausea and emesis and has not been able to eat much solids since 10/15 but has been able to keep some liquids down.  Also reports initial constipation and needed to take milk of magnesia and MiraLAX in order to have bowel movements over the last 2 days.  His pain is very diffuse but is mostly in the left lower quadrant.  Reports subjective fevers at home with sweats and chills.  He reports over the last few years having intermittent episodes of nausea, vomiting, and abdominal pain and has had extensive work-up including ultrasound and other studies that have not shown the etiology.  In the emergency room, he is afebrile but had low-grade tachycardia which improved with fluids.  His laboratory work-up was remarkable for a white count of 34, normal lactic acid of 1.6, with signs of dehydration with hyponatremia, hypochloremia.  For unclear reason his albumin is 2.3.  He also had a CT scan of the abdomen pelvis with contrast which shows inflammation and inflammatory changes of the left lower quadrant at the sigmoid colon with diffuse areas of intra-abdominal fluid collections and abscesses going between the lateral aspect of the liver, inferior to the bladder, inferior to the left kidney, and throughout other areas which I do not think would be able to be abnormal for percutaneous drainage.  I personally viewed all of the images and agree with the findings.  Past Medical History: Past Medical History:  Diagnosis Date   Arthritis    Chronic back pain    GERD (gastroesophageal reflux disease)    Kidney disease      Past Surgical  History: Past Surgical History:  Procedure Laterality Date   COLONOSCOPY WITH PROPOFOL N/A 02/03/2016   Procedure: COLONOSCOPY WITH PROPOFOL;  Surgeon: Hulen Luster, MD;  Location: [facility] ENDOSCOPY;  Service: Gastroenterology;  Laterality: N/A;   ESOPHAGOGASTRODUODENOSCOPY (EGD) WITH PROPOFOL N/A 02/03/2016   Procedure: ESOPHAGOGASTRODUODENOSCOPY (EGD) WITH PROPOFOL;  Surgeon: Hulen Luster, MD;  Location: [facility] ENDOSCOPY;  Service: Gastroenterology;  Laterality: N/A;   KNEE SURGERY      Home Medications: Prior to Admission medications   Medication Sig Start Date End Date Taking? Authorizing Provider  colchicine 0.6 MG tablet Take 1 tablet (0.6 mg total) by mouth 2 (two) times daily as needed. 12/02/16 12/02/17  Carrie Mew, MD  famotidine (PEPCID) 20 MG tablet Take 1 tablet (20 mg total) by mouth 2 (two) times daily. 12/02/16   Carrie Mew, MD  gabapentin (NEURONTIN) 300 MG capsule Take 300 mg by mouth daily.    [provider]  omeprazole (PRILOSEC) 40 MG capsule Take 40 mg by mouth daily.    [provider]    Allergies: No Known Allergies  Social History:  reports that he has never smoked. He has never used smokeless tobacco. He reports that he does not currently use alcohol. He reports current drug use. Drug: Marijuana.   Family History: No family history on file.  Review of Systems: Review of Systems  Constitutional:  Positive for chills and fever.  HENT:  Negative for hearing loss.   Respiratory:  Negative for shortness of breath.   Cardiovascular:  Negative for chest pain.  Gastrointestinal:  Positive for abdominal pain, constipation, nausea and vomiting. Negative for diarrhea.  Genitourinary:  Negative for dysuria.  Musculoskeletal:  Positive for back pain.  Skin:  Negative for rash.  Neurological:  Negative for dizziness.  Psychiatric/Behavioral:  Negative for depression.    Physical Exam BP 135/79   Pulse 80   Temp 98.8 F (37.1 C) (Oral)   Resp  17   Ht 6' (1.829 m)   Wt 90.7 kg   SpO2 99%   BMI 27.12 kg/m  CONSTITUTIONAL: Patient appears in significant pain distress. HEENT:  Normocephalic, atraumatic, extraocular motion intact. NECK: Trachea is midline, and there is no jugular venous distension.  RESPIRATORY:  Normal respiratory effort without pathologic use of accessory muscles. CARDIOVASCULAR: Regular rhythm and rate. GI: The abdomen is soft, nondistended, but significantly tender to palpation throughout the abdomen with worst tenderness in the left lower quadrant.  Patient does show peritoneal signs.  MUSCULOSKELETAL:  Normal muscle strength and tone in all four extremities.  No peripheral edema or cyanosis. SKIN: Skin turgor is normal. There are no pathologic skin lesions.  NEUROLOGIC:  Motor and sensation is grossly normal.  Cranial nerves are grossly intact. PSYCH:  Alert and oriented to person, place and time. Affect is normal.  Laboratory Analysis: Results for orders placed or performed during the hospital encounter of 08/07/21 (from the past 24 hour(s))  Lipase, blood     Status: None   Collection Time: 08/07/21  8:18 PM  Result Value Ref Range   Lipase 23 11 - 51 U/L  Comprehensive metabolic panel     Status: Abnormal   Collection Time: 08/07/21  8:18 PM  Result Value Ref Range   Sodium 126 (L) 135 - 145 mmol/L   Potassium 3.5 3.5 - 5.1 mmol/L   Chloride 90 (L) 98 - 111 mmol/L   CO2 23 22 - 32 mmol/L   Glucose, Bld 109 (H) 70 - 99 mg/dL   BUN 16 6 - 20 mg/dL   Creatinine, Ser 0.68 0.61 - 1.24 mg/dL   Calcium 7.8 (L) 8.9 - 10.3 mg/dL   Total Protein 5.8 (L) 6.5 - 8.1 g/dL   Albumin 2.3 (L) 3.5 - 5.0 g/dL   AST 36 15 - 41 U/L   ALT 28 0 - 44 U/L   Alkaline Phosphatase 99 38 - 126 U/L   Total Bilirubin 1.3 (H) 0.3 - 1.2 mg/dL   GFR, Estimated >60 >60 mL/min   Anion gap 13 5 - 15  CBC     Status: Abnormal   Collection Time: 08/07/21  8:18 PM  Result Value Ref Range   WBC 34.0 (H) 4.0 - 10.5 K/uL   RBC  4.28 4.22 - 5.81 MIL/uL   Hemoglobin 12.5 (L) 13.0 - 17.0 g/dL   HCT 36.7 (L) 39.0 - 52.0 %   MCV 85.7 80.0 - 100.0 fL   MCH 29.2 26.0 - 34.0 pg   MCHC 34.1 30.0 - 36.0 g/dL   RDW 12.6 11.5 - 15.5 %   Platelets 437 (H) 150 - 400 K/uL   nRBC 0.0 0.0 - 0.2 %  Lactic acid, plasma     Status: None   Collection Time: 08/07/21 11:29 PM  Result Value Ref Range   Lactic Acid, Venous 1.6 0.5 - 1.9 mmol/L    Imaging: CT ABDOMEN PELVIS W CONTRAST  Result Date: 08/08/2021 CLINICAL DATA:  Left lower quadrant pain with diarrhea, initial encounter EXAM: CT ABDOMEN AND PELVIS  WITH CONTRAST TECHNIQUE: Multidetector CT imaging of the abdomen and pelvis was performed using the standard protocol following bolus administration of intravenous contrast. CONTRAST:  130mL OMNIPAQUE IOHEXOL 350 MG/ML SOLN COMPARISON:  03/16/2016 FINDINGS: Lower chest: No acute abnormality. Hepatobiliary: Liver is within normal limits. Gallbladder is unremarkable. There is a mild-to-moderate amount of perihepatic fluid identified. Pancreas: Unremarkable. No pancreatic ductal dilatation or surrounding inflammatory changes. Spleen: Normal in size without focal abnormality. Adrenals/Urinary Tract: Adrenal glands are within normal limits. Kidneys demonstrate a normal enhancement pattern. No renal calculi or obstructive changes are noted. Normal excretion is noted on delayed images. The bladder is decompressed. Stomach/Bowel: Colon shows evidence of diverticulosis and Peri colonic inflammatory change consistent with diverticulitis. Associated with the diverticular change are multiple well-circumscribed areas of fluid throughout the abdomen and pelvis. These are consistent with prior perforation and multifocal abscess formation. Adjacent to the rectum in the deep pelvis there is a 3 cm fluid collection identified best seen on image number 92 of series 2. Just posterior to the bladder there is a bilobed collection which measures approximately 6.7  x 2.8 cm in greatest transverse and AP dimensions respectively. In the left hemipelvis adjacent to the sigmoid there is a 2.7 cm fluid collection best seen on image number 81 of series 2. Bilobed collection is noted in the right lower quadrant measuring 4.6 cm in greatest dimension best seen on image number 73 of series 2. Additional 4.4 cm collection is noted along the anterior aspect of the left psoas muscle best seen on image number 65 of series 2 and a 6.5 x 5.3 cm fluid collection just anterior to lower pole of the left kidney best seen on image number 51 of series 2. A few smaller collections are noted along the course of the sigmoid colon. No definitive free air is seen. The area of perforation is not well delineated on this exam. The more proximal colon is unremarkable. The appendix is well visualized and within normal limits. Small bowel demonstrates some surrounding inflammatory change related to the underlying diverticulitis. Stomach is within normal limits. Vascular/Lymphatic: Aortic atherosclerosis. No enlarged abdominal or pelvic lymph nodes. Reproductive: Prostate is unremarkable. Other: Multiple fluid collections as described above scattered throughout the abdomen and pelvis as well as perihepatic fluid consistent with prior diverticular perforation and multifocal abscess formation. Musculoskeletal: Degenerative changes of lumbar spine are noted. No compression deformities are seen. IMPRESSION: Changes consistent with diverticulitis throughout the sigmoid colon. Multiple associated fluid collections are identified throughout the abdomen and pelvis consistent with multifocal abscess formation. Free fluid is also noted surrounding the liver. The exact site of prior perforation is not well appreciated on this exam. No other focal abnormality is noted. Electronically Signed   By: Inez Catalina M.D.   On: 08/08/2021 00:01    Assessment and Plan: This is a 55 y.o. male with perforated acute  diverticulitis with abscesses.  - Discussed with patient the findings on his laboratory work-up as well as a CT scan images.  His white blood cell count is quite elevated and his CT scan shows perforated diverticulitis of the sigmoid colon with multiple areas of fluid collections in the abdomen and pelvis.  Discussed with him that I do not think all of this would be amicable for percutaneous drainage with interventional radiology and I think given his peritonitis it would be best to take him to the operating room for exploratory laparotomy and likely a Hartman's procedure.  Patient agrees that he is in significant  pain and is ready to proceed with surgery.  Reviewed with him the surgery at length including the risks of bleeding, infection, injury to surrounding structures, hospital stay, the need for percutaneous drains intraoperatively, NG tube placement, Foley catheter placement, and he is willing to proceed. - Patient will be admitted to the surgical team.  He has been started on IV Zosyn and IV fluids, will also add appropriate as needed medication for pain and nausea. - We will take to the operating room tonight pending anesthesia availability.  I spent 70 minutes dedicated to the care of this patient on the date of this encounter to include pre-visit review of records, face-to-face time with the patient discussing diagnosis and management, and any post-visit coordination of care.   Melvyn Neth, MD [facility] Pg:  [phone]

## 2021-08-08 NOTE — Transfer of Care (Signed)
Immediate Anesthesia Transfer of Care Note  Patient: Gregory Oconnell  Procedure(s) Performed: EXPLORATORY LAPAROTOMY (Abdomen) COLECTOMY WITH COLOSTOMY CREATION/HARTMANN PROCEDURE (Abdomen) BLADDER REPAIR injury (Bladder)  Patient Location: PACU  Anesthesia Type:General  Level of Consciousness: awake, alert  and oriented  Airway & Oxygen Therapy: Patient Spontanous Breathing and Patient connected to face mask oxygen  Post-op Assessment: Report given to RN and Post -op Vital signs reviewed and stable  Post vital signs: Reviewed and stable  Last Vitals:  Vitals Value Taken Time  BP    Temp    Pulse 111 08/08/21 0754  Resp 18 08/08/21 0754  SpO2 97 % 08/08/21 0754  Vitals shown include unvalidated device data.  Last Pain:  Vitals:   08/08/21 0106  TempSrc:   PainSc: 9          Complications: No notable events documented.

## 2021-08-08 NOTE — Consult Note (Signed)
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 Hampton Abbot this am.  WOC Nursing will see on Monday, 08/11/21 for ostomy teaching and stoma management.  [place] nursing team will follow, and will remain available to this patient, the nursing and medical teams.   Thank you for this consult.  Gregory Flakes, MSN, RN, [name], [name]  Pager# [phone]

## 2021-08-08 NOTE — Anesthesia Preprocedure Evaluation (Addendum)
Anesthesia Evaluation  Patient identified by MRN, date of birth, ID band Patient awake    Reviewed: Allergy & Precautions, H&P , NPO status , Patient's Chart, lab work & pertinent test results, reviewed documented beta blocker date and time   Airway Mallampati: II   Neck ROM: full    Dental  (+) Teeth Intact, Missing, Chipped,    Pulmonary neg pulmonary ROS,    Pulmonary exam normal        Cardiovascular hypertension, Pt. on medications Normal cardiovascular exam Rhythm:Regular Rate:Normal     Neuro/Psych negative neurological ROS  negative psych ROS   GI/Hepatic GERD  Controlled and Medicated,(+)     substance abuse  marijuana use,   Endo/Other  negative endocrine ROS  Renal/GU negative Renal ROS  negative genitourinary   Musculoskeletal  (+) Arthritis ,   Abdominal (+)  Abdomen: tender.  distended  Peds  Hematology negative hematology ROS (+)   Anesthesia Other Findings Past Medical History:   GERD (gastroesophageal reflux disease)                       Kidney disease                                               Arthritis                                                  Past Surgical History:   KNEE SURGERY                                                BMI    Body Mass Index   33.08 kg/m 2     Reproductive/Obstetrics                            Anesthesia Physical  Anesthesia Plan  ASA: 3  Anesthesia Plan: General   Post-op Pain Management:    Induction: Intravenous  PONV Risk Score and Plan: 3 and Ondansetron, Dexamethasone and Midazolam  Airway Management Planned: Oral ETT  Additional Equipment:   Intra-op Plan:   Post-operative Plan: Extubation in OR  Informed Consent: I have reviewed the patients History and Physical, chart, labs and discussed the procedure including the risks, benefits and alternatives for the proposed anesthesia with the patient or  authorized representative who has indicated his/her understanding and acceptance.     Dental Advisory Given  Plan Discussed with: CRNA, Anesthesiologist and Surgeon  Anesthesia Plan Comments: (Consented for regional anesthesia)       Anesthesia Quick Evaluation

## 2021-08-08 NOTE — ED Notes (Signed)
Report given to OR RN.

## 2021-08-08 NOTE — Progress Notes (Signed)
Brief Progress Note Patient seen day of surgery Reports he is feeling uncomfortable at the moment  NAD NGT in place; clear gastric fluid in canister No respiratory distress Tachycardic; regular Soft, incisional soreness, non-distended, surgical drain in RLQ; output serosanguinous. Colostomy in left abdomen, pink, no gas or stool Foley in place Midline laparotomy CDI with staples, penrose, and honeycomb  Plan:  Continue NPO  Continue NGT decompression; LIS; monitor abd record output Continue foley catheter for intra-operative bladder injury (repaired); likely need this 7-10 days Continue surgical drain; monitor and record output Monitor abdominal examination Monitor ostomy output; WOC engaged, will see Monday Pain control prn; antiemetics prn Can hold on mobilization today as he is only a few hours post-op  Dr Lysle Pearl is covering surgical patients over the weekend  -- Edison Simon, PA-C [facility] 08/08/2021, 1:31 PM [phone] M-F: 7am - 4pm

## 2021-08-08 NOTE — ED Notes (Signed)
Surgeon at bedside with patient. 

## 2021-08-09 LAB — CBC
HCT: 31.8 % — ABNORMAL LOW (ref 39.0–52.0)
Hemoglobin: 10.8 g/dL — ABNORMAL LOW (ref 13.0–17.0)
MCH: 29.3 pg (ref 26.0–34.0)
MCHC: 34 g/dL (ref 30.0–36.0)
MCV: 86.4 fL (ref 80.0–100.0)
Platelets: 444 10*3/uL — ABNORMAL HIGH (ref 150–400)
RBC: 3.68 MIL/uL — ABNORMAL LOW (ref 4.22–5.81)
RDW: 12.8 % (ref 11.5–15.5)
WBC: 31.8 10*3/uL — ABNORMAL HIGH (ref 4.0–10.5)
nRBC: 0 % (ref 0.0–0.2)

## 2021-08-09 LAB — BASIC METABOLIC PANEL
Anion gap: 8 (ref 5–15)
BUN: 26 mg/dL — ABNORMAL HIGH (ref 6–20)
CO2: 25 mmol/L (ref 22–32)
Calcium: 7.3 mg/dL — ABNORMAL LOW (ref 8.9–10.3)
Chloride: 97 mmol/L — ABNORMAL LOW (ref 98–111)
Creatinine, Ser: 0.88 mg/dL (ref 0.61–1.24)
GFR, Estimated: >60 mL/min (ref >60)
Glucose, Bld: 145 mg/dL — ABNORMAL HIGH (ref 70–99)
Potassium: 3.7 mmol/L (ref 3.5–5.1)
Sodium: 130 mmol/L — ABNORMAL LOW (ref 135–145)

## 2021-08-09 LAB — MAGNESIUM: Magnesium: 2.1 mg/dL (ref 1.7–2.4)

## 2021-08-09 LAB — HEMOGLOBIN AND HEMATOCRIT, BLOOD
HCT: 27.5 % — ABNORMAL LOW (ref 39.0–52.0)
Hemoglobin: 9.4 g/dL — ABNORMAL LOW (ref 13.0–17.0)

## 2021-08-09 MED ORDER — HYDROMORPHONE HCL 1 MG/ML IJ SOLN
2.0000 mg | INTRAMUSCULAR | Status: DC | PRN
Start: 2021-08-09 — End: 2021-08-12
  Administered 2021-08-09 – 2021-08-12 (×18): 2 mg via INTRAVENOUS
  Filled 2021-08-09 (×18): qty 2

## 2021-08-09 MED ORDER — KCL IN DEXTROSE-NACL 40-5-0.45 MEQ/L-%-% IV SOLN
INTRAVENOUS | Status: DC
  Filled 2021-08-09 (×5): qty 1000

## 2021-08-09 NOTE — Plan of Care (Signed)
  Problem: Education: Goal: Knowledge of General Education information will improve Description: Including pain rating scale, medication(s)/side effects and non-pharmacologic comfort measures 08/09/2021 1621 by Luiz Iron, RN Outcome: Progressing 08/09/2021 1621 by Luiz Iron, RN Outcome: Progressing   Problem: Health Behavior/Discharge Planning: Goal: Ability to manage health-related needs will improve 08/09/2021 1621 by Luiz Iron, RN Outcome: Progressing 08/09/2021 1621 by Luiz Iron, RN Outcome: Progressing   Problem: Clinical Measurements: Goal: Ability to maintain clinical measurements within normal limits will improve 08/09/2021 1621 by Luiz Iron, RN Outcome: Progressing 08/09/2021 1621 by Luiz Iron, RN Outcome: Progressing Goal: Will remain free from infection Outcome: Progressing Goal: Diagnostic test results will improve Outcome: Progressing Goal: Respiratory complications will improve Outcome: Progressing Goal: Cardiovascular complication will be avoided Outcome: Progressing   Problem: Activity: Goal: Risk for activity intolerance will decrease Outcome: Progressing   Problem: Nutrition: Goal: Adequate nutrition will be maintained Outcome: Progressing   Problem: Coping: Goal: Level of anxiety will decrease Outcome: Progressing   Problem: Elimination: Goal: Will not experience complications related to bowel motility Outcome: Progressing Goal: Will not experience complications related to urinary retention Outcome: Progressing   Problem: Pain Managment: Goal: General experience of comfort will improve Outcome: Progressing   Problem: Safety: Goal: Ability to remain free from injury will improve Outcome: Progressing   Problem: Skin Integrity: Goal: Risk for impaired skin integrity will decrease Outcome: Progressing

## 2021-08-09 NOTE — Progress Notes (Signed)
Subjective:  CC: Gregory Oconnell is a 55 y.o. male  Hospital stay day 1, 1 Day Post-Op hartmans' for diverticulitis  HPI: No acute issues overnight.  Able to ambulate.  No output or gas in ostomy.  ROS:  General: Denies weight loss, weight gain, fatigue, fevers, chills, and night sweats. Heart: Denies chest pain, palpitations, racing heart, irregular heartbeat, leg pain or swelling, and decreased activity tolerance. Respiratory: Denies breathing difficulty, shortness of breath, wheezing, cough, and sputum. GI: Denies change in appetite, heartburn, nausea, vomiting, constipation, diarrhea, and blood in stool. GU: Denies difficulty urinating, pain with urinating, urgency, frequency, blood in urine.   Objective:   Temp:  [97.9 F (36.6 C)-98.7 F (37.1 C)] 98.4 F (36.9 C) (10/22 0808) Pulse Rate:  [86-99] 86 (10/22 0808) Resp:  [16-18] 18 (10/22 0808) BP: (127-145)/(83-90) 127/87 (10/22 0808) SpO2:  [98 %-100 %] 98 % (10/22 0808)     Height: 6' (182.9 cm) Weight: 90.7 kg BMI (Calculated): 27.12   Intake/Output this shift:   Intake/Output Summary (Last 24 hours) at 08/09/2021 1517 Last data filed at 08/09/2021 7425 Gross per 24 hour  Intake 1987.58 ml  Output 1820 ml  Net 167.58 ml    Constitutional :  alert, cooperative, appears stated age, and no distress  Respiratory:  clear to auscultation bilaterally  Cardiovascular:  regular rate and rhythm  Gastrointestinal: Soft, no guarding, expected TTP along midline. Pink, swollen, ostomy, no output or gas . NG bilious output  Skin: Cool and moist. Midline dressing intact.,   Psychiatric: Normal affect, non-agitated, not confused       LABS:  CMP Latest Ref Rng & Units 08/09/2021 08/08/2021 08/07/2021  Glucose 70 - 99 mg/dL 145(H) 196(H) 109(H)  BUN 6 - 20 mg/dL 26(H) 21(H) 16  Creatinine 0.61 - 1.24 mg/dL 0.88 0.70 0.68  Sodium 135 - 145 mmol/L 130(L) 128(L) 126(L)  Potassium 3.5 - 5.1 mmol/L 3.7 4.2 3.5  Chloride 98 -  111 mmol/L 97(L) 96(L) 90(L)  CO2 22 - 32 mmol/L 25 22 23   Calcium 8.9 - 10.3 mg/dL 7.3(L) 7.5(L) 7.8(L)  Total Protein 6.5 - 8.1 g/dL - - 5.8(L)  Total Bilirubin 0.3 - 1.2 mg/dL - - 1.3(H)  Alkaline Phos 38 - 126 U/L - - 99  AST 15 - 41 U/L - - 36  ALT 0 - 44 U/L - - 28   CBC Latest Ref Rng & Units 08/09/2021 08/08/2021 08/07/2021  WBC 4.0 - 10.5 K/uL 31.8(H) 33.9(H) 34.0(H)  Hemoglobin 13.0 - 17.0 g/dL 10.8(L) 12.0(L) 12.5(L)  Hematocrit 39.0 - 52.0 % 31.8(L) 34.3(L) 36.7(L)  Platelets 150 - 400 K/uL 444(H) 438(H) 437(H)    RADS: N/a Assessment:   S/p harmtan's primary repair of bladder injury.  Continue foley for decompression.  NG until ROBF.  Pain controlled, continue abx for intra-abdominal infection.  Continue to encourage ambulation, hard candy, ice chips ok

## 2021-08-09 NOTE — TOC Initial Note (Addendum)
Transition of Care [facility]) - Initial/Assessment Note    Patient Details  Name: Gregory Oconnell MRN: [id number] Date of Birth: [birth year]  Transition of Care [facility]) CM/SW Contact:    Magnus Ivan, LCSW Phone Number: 08/09/2021, 10:38 AM  Clinical Narrative:                CSW spoke with patient regarding DC planning. Patient lives with wife who will be providing transportation at DC. PCP is Dr. Clide Deutscher. [facility]acy is [facility]. Patient has a RW, shower chair, and other DME at home (did not list). Patient is interested in [facility] if possible for new ostomy. No agency preference. CSW is working on finding coverage.   2:08- [facility], [facility], [facility], and [facility] are unable to accept patient's insurance.  Expected Discharge Plan: [place] Barriers to Discharge: Continued Medical Work up   Patient Goals and CMS Choice Patient states their goals for this hospitalization and ongoing recovery are:: home with wife CMS Medicare.gov Compare Post Acute Care list provided to:: Patient Choice offered to / list presented to : Patient  Expected Discharge Plan and Services Expected Discharge Plan: [place]       Living arrangements for the past 2 months: Single Family Home                                      Prior Living Arrangements/Services Living arrangements for the past 2 months: Single Family Home Lives with:: Spouse Patient language and need for interpreter reviewed:: Yes Do you feel safe going back to the place where you live?: Yes      Need for Family Participation in Patient Care: Yes (Comment) Care giver support system in place?: Yes (comment) Current home services: DME Criminal Activity/Legal Involvement Pertinent to Current Situation/Hospitalization: No - Comment as needed  Activities of Daily Living Home Assistive Devices/Equipment: None ADL Screening (condition at time of admission) Patient's cognitive  ability adequate to safely complete daily activities?: No Is the patient deaf or have difficulty hearing?: No Does the patient have difficulty seeing, even when wearing glasses/contacts?: No Does the patient have difficulty concentrating, remembering, or making decisions?: No Patient able to express need for assistance with ADLs?: Yes Does the patient have difficulty dressing or bathing?: No Independently performs ADLs?: Yes (appropriate for developmental age) Does the patient have difficulty walking or climbing stairs?: No Weakness of Legs: None Weakness of Arms/Hands: None  Permission Sought/Granted Permission sought to share information with : Facility Art therapist granted to share information with : Yes, Verbal Permission Granted     Permission granted to share info w AGENCY: [place], DME agencies as needed        Emotional Assessment       Orientation: : Oriented to Self, Oriented to Place, Oriented to  Time, Oriented to Situation Alcohol / Substance Use: Not Applicable Psych Involvement: No (comment)  Admission diagnosis:  Diverticulitis [K57.92] Intra-abdominal abscess ([place]) [K65.1] Diverticulitis of large intestine with perforation and abscess [K57.20] Patient Active Problem List   Diagnosis Date Noted   Diverticulitis of large intestine with perforation and abscess 08/08/2021   PCP:  Donnie Coffin, MD Pharmacy:   [place], [address] [place] [address] Phone: [phone] Fax: [phone]     Social Determinants of  Health (SDOH) Interventions    Readmission Risk Interventions No flowsheet data found.

## 2021-08-10 LAB — CBC WITH DIFFERENTIAL/PLATELET
Abs Immature Granulocytes: 0.8 10*3/uL — ABNORMAL HIGH (ref 0.00–0.07)
Basophils Absolute: 0.1 10*3/uL (ref 0.0–0.1)
Basophils Relative: 0 %
Eosinophils Absolute: 0.1 10*3/uL (ref 0.0–0.5)
Eosinophils Relative: 1 %
HCT: 27.5 % — ABNORMAL LOW (ref 39.0–52.0)
Hemoglobin: 9.3 g/dL — ABNORMAL LOW (ref 13.0–17.0)
Immature Granulocytes: 4 %
Lymphocytes Relative: 11 %
Lymphs Abs: 2.4 10*3/uL (ref 0.7–4.0)
MCH: 29.3 pg (ref 26.0–34.0)
MCHC: 33.8 g/dL (ref 30.0–36.0)
MCV: 86.8 fL (ref 80.0–100.0)
Monocytes Absolute: 1.2 10*3/uL — ABNORMAL HIGH (ref 0.1–1.0)
Monocytes Relative: 6 %
Neutro Abs: 16.6 10*3/uL — ABNORMAL HIGH (ref 1.7–7.7)
Neutrophils Relative %: 78 %
Platelets: 426 10*3/uL — ABNORMAL HIGH (ref 150–400)
RBC: 3.17 MIL/uL — ABNORMAL LOW (ref 4.22–5.81)
RDW: 13 % (ref 11.5–15.5)
WBC: 21.2 10*3/uL — ABNORMAL HIGH (ref 4.0–10.5)
nRBC: 0 % (ref 0.0–0.2)

## 2021-08-10 LAB — BASIC METABOLIC PANEL
Anion gap: 7 (ref 5–15)
BUN: 22 mg/dL — ABNORMAL HIGH (ref 6–20)
CO2: 25 mmol/L (ref 22–32)
Calcium: 7.4 mg/dL — ABNORMAL LOW (ref 8.9–10.3)
Chloride: 101 mmol/L (ref 98–111)
Creatinine, Ser: 0.68 mg/dL (ref 0.61–1.24)
GFR, Estimated: >60 mL/min (ref >60)
Glucose, Bld: 133 mg/dL — ABNORMAL HIGH (ref 70–99)
Potassium: 3.8 mmol/L (ref 3.5–5.1)
Sodium: 133 mmol/L — ABNORMAL LOW (ref 135–145)

## 2021-08-10 NOTE — Progress Notes (Signed)
Subjective:  CC: Gregory Oconnell is a 55 y.o. male  Hospital stay day 2, 2 Days Post-Op hartmans' for diverticulitis  HPI: Pain meds adjusted.  Doing better this am.  Able to ambulate.  Still No output or gas in ostomy.  ROS:  General: Denies weight loss, weight gain, fatigue, fevers, chills, and night sweats. Heart: Denies chest pain, palpitations, racing heart, irregular heartbeat, leg pain or swelling, and decreased activity tolerance. Respiratory: Denies breathing difficulty, shortness of breath, wheezing, cough, and sputum. GI: Denies change in appetite, heartburn, nausea, vomiting, constipation, diarrhea, and blood in stool. GU: Denies difficulty urinating, pain with urinating, urgency, frequency, blood in urine.   Objective:   Temp:  [98.1 F (36.7 C)-98.8 F (37.1 C)] 98.2 F (36.8 C) (10/23 0838) Pulse Rate:  [69-78] 71 (10/23 0838) Resp:  [14-16] 14 (10/23 0838) BP: (131-152)/(83-94) 131/84 (10/23 0838) SpO2:  [97 %-100 %] 99 % (10/23 0838)     Height: 6' (182.9 cm) Weight: 90.7 kg BMI (Calculated): 27.12   Intake/Output this shift:   Intake/Output Summary (Last 24 hours) at 08/10/2021 0906 Last data filed at 08/10/2021 0504 Gross per 24 hour  Intake 1461.39 ml  Output 1475 ml  Net -13.61 ml    Constitutional :  alert, cooperative, appears stated age, and no distress  Respiratory:  clear to auscultation bilaterally  Cardiovascular:  regular rate and rhythm  Gastrointestinal: Soft, no guarding, expected TTP along midline. Pink, swollen, ostomy, no output or gas . NG bilious output, JP serous  Skin: Cool and moist. Midline dressing intact.,   Psychiatric: Normal affect, non-agitated, not confused       LABS:  CMP Latest Ref Rng & Units 08/10/2021 08/09/2021 08/08/2021  Glucose 70 - 99 mg/dL 133(H) 145(H) 196(H)  BUN 6 - 20 mg/dL 22(H) 26(H) 21(H)  Creatinine 0.61 - 1.24 mg/dL 0.68 0.88 0.70  Sodium 135 - 145 mmol/L 133(L) 130(L) 128(L)  Potassium 3.5 - 5.1  mmol/L 3.8 3.7 4.2  Chloride 98 - 111 mmol/L 101 97(L) 96(L)  CO2 22 - 32 mmol/L 25 25 22   Calcium 8.9 - 10.3 mg/dL 7.4(L) 7.3(L) 7.5(L)  Total Protein 6.5 - 8.1 g/dL - - -  Total Bilirubin 0.3 - 1.2 mg/dL - - -  Alkaline Phos 38 - 126 U/L - - -  AST 15 - 41 U/L - - -  ALT 0 - 44 U/L - - -   CBC Latest Ref Rng & Units 08/10/2021 08/09/2021 08/09/2021  WBC 4.0 - 10.5 K/uL 21.2(H) - 31.8(H)  Hemoglobin 13.0 - 17.0 g/dL 9.3(L) 9.4(L) 10.8(L)  Hematocrit 39.0 - 52.0 % 27.5(L) 27.5(L) 31.8(L)  Platelets 150 - 400 K/uL 426(H) - 444(H)    RADS: N/a Assessment:   S/p harmtan's primary repair of bladder injury.  Continue foley for decompression.  NG until ROBF.  Pain controlled, continue abx for intra-abdominal infection.  Continue to encourage ambulation, hard candy, ice chips.

## 2021-08-10 NOTE — TOC Progression Note (Addendum)
Transition of Care [facility]) - Progression Note    Patient Details  Name: Gregory Oconnell MRN: [id number] Date of Birth: 12/22/1965  [facility]) CM/SW [place], LCSW Phone Number: 08/10/2021, 9:13 AM  Clinical Narrative:   CSW reached out to [facility], [facility], and [facility] to inquire about [place] coverage. Waiting on responses.   3:50- Unable to find [name] coverage today.    Expected Discharge Plan: [place] Barriers to Discharge: Continued Medical Work up  Expected Discharge Plan and Services Expected Discharge Plan: [place] arrangements for the past 2 months: Single Family Home                                       Social Determinants of Health (SDOH) Interventions    Readmission Risk Interventions No flowsheet data found.

## 2021-08-10 NOTE — Op Note (Addendum)
Procedure Date:  08/08/2021  Pre-operative Diagnosis:  Perforated diverticulitis with multiple intra-abdominal abscesses  Post-operative Diagnosis: Perforated diverticulitis with multiple intra-abdominal abscesses  Procedure:  Exploratory Laparotomy with Hartmann's procedure, repair of bladder injury  Surgeon:  Melvyn Neth, MD  Assistant:  Edison Simon, PA-C, for ostomy creation.  Anesthesia:  General endotracheal  Estimated Blood Loss:  200 ml  Specimens:   Sigmoid colon Abscess capsule tissue, for culture  Complications:  Anterior bladder injury, repaired primarily.  Indications for Procedure:  This is a 55 y.o. male who presents with abdominal pain and peritonitis and workup revealing perforated diverticulitis with multiple intra-abdominal abscesses.  The risks of bleeding, abscess or infection, injury to surrounding structures, and need for further procedures were all discussed with the patient and was willing to proceed.  Description of Procedure: The patient was correctly identified in the preoperative area and brought into the operating room.  The patient was placed supine with VTE prophylaxis in place.  Appropriate time-outs were performed.  Anesthesia was induced and the patient was intubated.  Foley catheter was placed.  Appropriate antibiotics were infused.  The abdomen was prepped and draped in a sterile fashion.  A midline incision was made and electrocautery was used to dissect down the subcutaneous tissue to the fascia.  The fascia was incised and extended superiorly and inferiorly.  Upon entering the abdomen (organ space), I encountered multiple abscesses and areas of purulent fluid in the RUQ by the liver, in the pelvis, and between loops of small bowel and between the sigmoid colon and loops of small bowel, confirming purulent peritonitis.  Pool sucker was used to suction different quadrants while manually bluntly dissecting between bowel loops.  First I stared  running the small bowel from the Ligament of Treitz distally to the cecum.  There were multiple areas of adhesions from the inflammatory response which were broken down bluntly.  There was an abscess between bowel loops which was entered and the capsule material was excised using cautery and sent for tissue culture.  The right colon and transverse colon were also visualized and were soft without any inflammatory changes.  The small bowel and transverse colon were mobilized cephalad, which allowed exposure of the sigmoid colon.  This was markedly inflamed.  The sigmoid colon was also redundant, with a segment curling into the pelvis and back.  The midline incision was extended distally in order to better mobilize that redundant portion.  In doing so, unfortunately, a bladder injury was created in the anterior wall, measuring about 1 cm in size.  This was confirmed to be the bladder as the Foley catheter was visualized within.  This was repaired primarily using 0 Vicryl sutures in two layers.  After this, the redundant loop of sigmoid colon was bluntly mobilized in order to expose the distal sigmoid going to the rectosigmoid junction.  Another abscess was revealed there and the capsule was also resected.    We then started mobilizing the sigmoid colon by dissecting along the White line of [place] proximally and distally.  Proximally, we mobilized part of the descending colon to allow enough length for our end colostomy.  Distally, the sigmoid was mobilized and the gonadal vessels visualized.  The ureter was not fully seen, but the sigmoid mesentery was clearly without evidence of the ureter.  Proximally, an area of soft colon wall was determined for our end colostomy, and a window was created in the mesentery and 75 mm blue load GIA was used to  transect the colon proximally.  Then LigaSure was used to go across the sigmoid mesentery, going very close to the colon wall as a precaution since the ureter was not  visualized.  We continued this dissection distally to the pelvis until another soft area of the colon wall was reached.  At that point, a green load contour stapler was used to transect the colon distally, near the rectosigmoid junction.  The specimen was removed and sent to pathology.  The abdomen was thoroughly irrigated in all quadrants.  The NG tube was confirmed in good position within the stomach.  Exparel solution mixed with 0.5% bupivacaine with epi was infiltrated over the peritoneum, fascia, and subcutaneous tissue.  A 19 Fr Blake drain was placed in right lower quadrant going to the pelvis and left lower quadrant for better drainage.  A skin incision was created in the left lower quadrant for our end colostomy and cautery was used to dissect a section of skin and subcutaneous tissue down to the fascia.  The fascia was incised in cruciate fashion, rectus muscles split, and posterior sheath and peritoneum incised in cruciate fashion as well.  This allowed at least two fingers through, which would be appropriate size for the ostomy.  The distal end of the descending colon was brought through the incision.  We then tested the bladder repair using using diluted methylene blue in saline.  There was no gross leakage from the repair site after distention with 350 ml of solution.  The fascia was then closed using #1 PDS sutures.  The midline wound was irrigated and closed using staples with a 1/4 inch penrose drain under the staple line for drainage.  The Blake drain was secured using 3-0 nylon suture.  The wound was then covered with sterile towel so we can proceed with ostomy creation.  Mr. Olean Ree scrubbed in at that point and we created the end colostomy in [place] fashion without any complications, using multiple 3-0 Vicryl sutures.  There was good approximation of the skin to the bowel mucosa without any gaps.  I digitized the ostomy and the fascia did not feel tight against the colon wall.    The wounds  were cleaned and the midline incision was dressed with Honeycomb dressing and the drain with 4x4 gauze and tegaderm.  An ostomy appliance was placed over the ostomy.  The patient was emerged from anesthesia and extubated and brought to the recovery room for further management.  The patient tolerated the procedure well and all counts were correct at the end of the case.   Melvyn Neth, MD

## 2021-08-10 NOTE — Consult Note (Signed)
[place] Nurse ostomy consult note  Appointment confirmed to work with patient and wife at [place] on Monday, 08/11/21.  [place] nursing team will follow, and will remain available to this patient, the nursing and medical teams.   Thanks, Maudie Flakes, MSN, RN, [place], [name]  Pager# [phone]

## 2021-08-10 NOTE — Plan of Care (Signed)

## 2021-08-11 ENCOUNTER — Other Ambulatory Visit: Payer: Self-pay | Admitting: Urology

## 2021-08-11 ENCOUNTER — Encounter: Payer: Self-pay | Admitting: Surgery

## 2021-08-11 DIAGNOSIS — S3720XS Unspecified injury of bladder, sequela: Secondary | ICD-10-CM

## 2021-08-11 LAB — CBC WITH DIFFERENTIAL/PLATELET
Abs Immature Granulocytes: 0.75 10*3/uL — ABNORMAL HIGH (ref 0.00–0.07)
Basophils Absolute: 0 10*3/uL (ref 0.0–0.1)
Basophils Relative: 0 %
Eosinophils Absolute: 0.4 10*3/uL (ref 0.0–0.5)
Eosinophils Relative: 3 %
HCT: 28.2 % — ABNORMAL LOW (ref 39.0–52.0)
Hemoglobin: 9.4 g/dL — ABNORMAL LOW (ref 13.0–17.0)
Immature Granulocytes: 5 %
Lymphocytes Relative: 15 %
Lymphs Abs: 2.1 10*3/uL (ref 0.7–4.0)
MCH: 30.1 pg (ref 26.0–34.0)
MCHC: 33.3 g/dL (ref 30.0–36.0)
MCV: 90.4 fL (ref 80.0–100.0)
Monocytes Absolute: 1 10*3/uL (ref 0.1–1.0)
Monocytes Relative: 7 %
Neutro Abs: 10 10*3/uL — ABNORMAL HIGH (ref 1.7–7.7)
Neutrophils Relative %: 70 %
Platelets: 420 10*3/uL — ABNORMAL HIGH (ref 150–400)
RBC: 3.12 MIL/uL — ABNORMAL LOW (ref 4.22–5.81)
RDW: 12.9 % (ref 11.5–15.5)
WBC: 14.2 10*3/uL — ABNORMAL HIGH (ref 4.0–10.5)
nRBC: 0.1 % (ref 0.0–0.2)

## 2021-08-11 LAB — BASIC METABOLIC PANEL
Anion gap: 10 (ref 5–15)
BUN: 18 mg/dL (ref 6–20)
CO2: 25 mmol/L (ref 22–32)
Calcium: 7.5 mg/dL — ABNORMAL LOW (ref 8.9–10.3)
Chloride: 100 mmol/L (ref 98–111)
Creatinine, Ser: 0.73 mg/dL (ref 0.61–1.24)
GFR, Estimated: >60 mL/min (ref >60)
Glucose, Bld: 107 mg/dL — ABNORMAL HIGH (ref 70–99)
Potassium: 3.7 mmol/L (ref 3.5–5.1)
Sodium: 135 mmol/L (ref 135–145)

## 2021-08-11 MED ORDER — METHOCARBAMOL 500 MG PO TABS
500.0000 mg | ORAL_TABLET | Freq: Four times a day (QID) | ORAL | Status: DC | PRN
  Administered 2021-08-11 – 2021-08-12 (×5): 500 mg via ORAL
  Filled 2021-08-11 (×5): qty 1

## 2021-08-11 MED ORDER — AMLODIPINE BESYLATE 5 MG PO TABS
5.0000 mg | ORAL_TABLET | Freq: Every day | ORAL | Status: DC
  Administered 2021-08-11 – 2021-08-13 (×3): 5 mg via ORAL
  Filled 2021-08-11 (×3): qty 1

## 2021-08-11 MED ORDER — GABAPENTIN 400 MG PO CAPS
800.0000 mg | ORAL_CAPSULE | Freq: Three times a day (TID) | ORAL | Status: DC
  Administered 2021-08-11 – 2021-08-13 (×7): 800 mg via ORAL
  Filled 2021-08-11 (×7): qty 2

## 2021-08-11 NOTE — Progress Notes (Signed)
08/11/2021  Subjective: Patient is 3 Days Post-Op s/p exlap with Hartmann's procedure and bladder injury repair.  No acute events overnight.  Patient reports that his pain is better controlled and improved compared to post-op.  He ambulated around the floor today and noticed some gas in the bag afterwards.  No nausea.  Hgb stabilized, WBC improving.  Vital signs: Temp:  [98.2 F (36.8 C)-98.6 F (37 C)] 98.6 F (37 C) (10/24 0523) Pulse Rate:  [61-71] 61 (10/24 0523) Resp:  [14-16] 16 (10/24 0523) BP: (131-149)/(84-97) 149/97 (10/24 0523) SpO2:  [99 %-100 %] 100 % (10/24 0523)   Intake/Output: 10/23 0701 - 10/24 0700 In: 1745.2 [I.V.:1565.2; IV Piggyback:180] Out: 2850 [Urine:1650; Emesis/NG output:1200] Last BM Date: 08/05/21  Physical Exam: Constitutional: No acute distress Abdomen:  soft, non-distended, appropriately tender to palpation.  Midline incision is healing well, clean, dry, intact.  Drain with serosanguinous fluid.  Ostomy with some gas and starting to have some stool.  Mucosa with some edema but improving and is pink/viable.  Labs:  Recent Labs    08/10/21 0515 08/11/21 0459  WBC 21.2* 14.2*  HGB 9.3* 9.4*  HCT 27.5* 28.2*  PLT 426* 420*   Recent Labs    08/10/21 0515 08/11/21 0459  NA 133* 135  K 3.8 3.7  CL 101 100  CO2 25 25  GLUCOSE 133* 107*  BUN 22* 18  CREATININE 0.68 0.73  CALCIUM 7.4* 7.5*   Recent Labs    08/08/21 0912  LABPROT 15.4*  INR 1.2    Imaging: No results found.  Assessment/Plan: This is a 55 y.o. male s/p Hartmann's and bladder repair.  --Patient is starting to have some bowel function.  Will do NG clamping trial this morning.  If residual is less than 150 ml, will be able to d/c NG tube and start clears.  Otherwise, would keep to suction today and try again tomorrow. --Continue IV fluids, IV Zosyn while awaiting culture sensitivities, and foley catheter given bladder repair. --Continue OOB --Appreciate WOC's help with  his ostomy and teaching.   Melvyn Neth, [facility]

## 2021-08-11 NOTE — Consult Note (Signed)
[place] Nurse ostomy consult note Visit attended by patient's wife and two nursing students today.   Stoma type/location: LLQ colostomy Stomal assessment/size: 1 and 3/8 x 2 inch oval with lumen in center Peristomal assessment: Slightly oval, edematous, red, moist, pink Treatment options for stomal/peristomal skin:  Output: small soft brown stool and +flatus Ostomy pouching: 2pc. 2 and 3/4 inch pouching system with skin barrier ring. Education provided:  Explained role of ostomy nurse and creation of stoma  Explained stoma characteristics (budded, flush, color, texture, care) Demonstrated pouch change (cutting new skin barrier, measuring stoma, cleaning peristomal skin and stoma, use of barrier ring) Education on emptying when 1/3 to 1/2 full and how to empty Demonstrated use of wick to clean spout  Discussed bathing, diet, gas, medication use, constipation Discussed risk of peristomal hernia to to limit lifting more than 15-20 pounds  Answered patient/family questions about bathing (showering). Patient able to give return demonstration of Lock and Roll closure today.    Supplies in room:  Five (5) 2 and 3/4 inch pouching set ups to be used first and five (5) 2 and 1/4 inch pouching set ups to be used as edema subsides   Enrolled patient in [place] program: Yes   [place] nursing team will continue to follow and will remain available to this patient, the nursing and medical teams.   Thanks, Maudie Flakes, MSN, RN, [place], [name]  Pager# [phone]

## 2021-08-12 LAB — CBC WITH DIFFERENTIAL/PLATELET
Abs Immature Granulocytes: 0.54 10*3/uL — ABNORMAL HIGH (ref 0.00–0.07)
Basophils Absolute: 0 10*3/uL (ref 0.0–0.1)
Basophils Relative: 0 %
Eosinophils Absolute: 0.5 10*3/uL (ref 0.0–0.5)
Eosinophils Relative: 3 %
HCT: 28.5 % — ABNORMAL LOW (ref 39.0–52.0)
Hemoglobin: 9.5 g/dL — ABNORMAL LOW (ref 13.0–17.0)
Immature Granulocytes: 4 %
Lymphocytes Relative: 15 %
Lymphs Abs: 2.2 10*3/uL (ref 0.7–4.0)
MCH: 29.2 pg (ref 26.0–34.0)
MCHC: 33.3 g/dL (ref 30.0–36.0)
MCV: 87.7 fL (ref 80.0–100.0)
Monocytes Absolute: 0.9 10*3/uL (ref 0.1–1.0)
Monocytes Relative: 6 %
Neutro Abs: 10.5 10*3/uL — ABNORMAL HIGH (ref 1.7–7.7)
Neutrophils Relative %: 72 %
Platelets: 464 10*3/uL — ABNORMAL HIGH (ref 150–400)
RBC: 3.25 MIL/uL — ABNORMAL LOW (ref 4.22–5.81)
RDW: 12.9 % (ref 11.5–15.5)
WBC: 14.5 10*3/uL — ABNORMAL HIGH (ref 4.0–10.5)
nRBC: 0 % (ref 0.0–0.2)

## 2021-08-12 LAB — CREATININE, FLUID (PLEURAL, PERITONEAL, JP DRAINAGE): Creat, Fluid: 0.8 mg/dL

## 2021-08-12 LAB — BASIC METABOLIC PANEL
Anion gap: 7 (ref 5–15)
BUN: 13 mg/dL (ref 6–20)
CO2: 21 mmol/L — ABNORMAL LOW (ref 22–32)
Calcium: 7.5 mg/dL — ABNORMAL LOW (ref 8.9–10.3)
Chloride: 101 mmol/L (ref 98–111)
Creatinine, Ser: 0.75 mg/dL (ref 0.61–1.24)
GFR, Estimated: >60 mL/min (ref >60)
Glucose, Bld: 153 mg/dL — ABNORMAL HIGH (ref 70–99)
Potassium: 3.9 mmol/L (ref 3.5–5.1)
Sodium: 129 mmol/L — ABNORMAL LOW (ref 135–145)

## 2021-08-12 LAB — SURGICAL PATHOLOGY

## 2021-08-12 MED ORDER — HYDROMORPHONE HCL 1 MG/ML IJ SOLN
1.0000 mg | INTRAMUSCULAR | Status: DC | PRN
Start: 2021-08-12 — End: 2021-08-13
  Administered 2021-08-12: 1 mg via INTRAVENOUS
  Filled 2021-08-12: qty 1

## 2021-08-12 MED ORDER — OXYCODONE HCL 5 MG PO TABS
5.0000 mg | ORAL_TABLET | ORAL | Status: DC | PRN
  Administered 2021-08-12 – 2021-08-13 (×5): 10 mg via ORAL
  Filled 2021-08-12 (×6): qty 2

## 2021-08-12 MED ORDER — POTASSIUM CHLORIDE IN NACL 20-0.9 MEQ/L-% IV SOLN
INTRAVENOUS | Status: DC
  Filled 2021-08-12 (×3): qty 1000

## 2021-08-12 MED ORDER — ACETAMINOPHEN 500 MG PO TABS: 1000.0000 mg | ORAL_TABLET | Freq: Four times a day (QID) | ORAL | Status: DC | PRN

## 2021-08-12 NOTE — TOC Progression Note (Signed)
Transition of Care [facility]) - Progression Note    Patient Details  Name: Gregory Oconnell MRN: [id number] Date of Birth: [birth year]  Transition [facility] Care [facility]) CM/SW Contact  Beverly Sessions, RN Phone Number: 08/12/2021, 2:05 PM  Clinical Narrative:    Tommi Rumps with [facility] has accepted for home health   Expected Discharge Plan: [place] Barriers to Discharge: Continued Medical Work up  Expected Discharge Plan and Services Expected Discharge Plan: [place] arrangements for the past 2 months: Single Family Home                                       Social Determinants of Health (SDOH) Interventions    Readmission Risk Interventions No flowsheet data found.

## 2021-08-12 NOTE — Consult Note (Signed)
[place] Nurse ostomy follow up Stoma type/location: LLQ, end colostomy Stomal assessment/size: 1 3/8" x 2" oval shaped at last pouch change  Peristomal assessment: NA Treatment options for stomal/peristomal skin: using 2" skin barrier ring Output pasty green stool (300cc) Ostomy pouching: 2pc.  Education provided:  Met with patient at the bedside Discussed ostomy and reviewed basic care with patient today Demonstrated emptying and using wick to clean spout.  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 Stressed importance to establish relationship with supplier soon as to not run out of supplies at home.  5 pouching systems in the room for DC to home 1 used as demonstrations today; left for pouch change tomorrow Verified with patient his wife will be coming in for teaching visit Wednesday at [place] team aware Enrolled patient in [place] Start Discharge program: Yes, previously   [place] Nurse will follow along with you for continued support with ostomy teaching and care [place] MSN, [place], [place], [place], [place]

## 2021-08-12 NOTE — Progress Notes (Signed)
[place] Hospital Day(s): 4.   Post op day(s): 4 Days Post-Op.   Interval History:  Patient seen and examined No acute events or new complaints overnight.  Patient doing better, some abdominal soreness, mostly with exacerbation of chronic back pain No fever, chills, nausea, emesis Leukocytosis has seems to have hit a plateau at 14.5K; but remains markedly improved from 34K on presentation Renal function normal; sCr - 0.75; UO - 3.0L Hyponatremia to 129 o/w no significant electrolyte derangements  Surgical drain with 20 ccs out; drain creatinine sent as precaution NGT removed yesterday (10/24) He is on CLD; tolerating well He is having gas and some stool from ostomy  Vital signs in last 24 hours: [min-max] current  Temp:  [98.1 F (36.7 C)-100.1 F (37.8 C)] 99.6 F (37.6 C) (10/25 0452) Pulse Rate:  [60-90] 90 (10/25 0452) Resp:  [16-18] 17 (10/25 0452) BP: (113-136)/(77-89) 113/77 (10/25 0452) SpO2:  [97 %-100 %] 100 % (10/25 0452)     Height: 6' (182.9 cm) Weight: 90.7 kg BMI (Calculated): 27.12   Intake/Output last 2 shifts:  10/24 0701 - 10/25 0700 In: 1913.3 [P.O.:50; I.V.:1715.5; IV Piggyback:147.8] Out: 3120 [Urine:3000; Emesis/NG output:100; Drains:20]   Physical Exam:  Constitutional: alert, cooperative and no distress  Respiratory: breathing non-labored at rest  Cardiovascular: regular rate and sinus rhythm  Gastrointestinal: Soft, incisional soreness, non-distended, surgical drain in RLQ; output serosanguinous. Colostomy in left abdomen, pink, gas and some stool in bag Genitourinary; Foley in place  Integumentary: Midline laparotomy CDI with staples, penrose, and honeycomb  Labs:  CBC Latest Ref Rng & Units 08/12/2021 08/11/2021 08/10/2021  WBC 4.0 - 10.5 K/uL 14.5(H) 14.2(H) 21.2(H)  Hemoglobin 13.0 - 17.0 g/dL 9.5(L) 9.4(L) 9.3(L)  Hematocrit 39.0 - 52.0 % 28.5(L) 28.2(L) 27.5(L)  Platelets 150 - 400 K/uL 464(H)  420(H) 426(H)   CMP Latest Ref Rng & Units 08/12/2021 08/11/2021 08/10/2021  Glucose 70 - 99 mg/dL 153(H) 107(H) 133(H)  BUN 6 - 20 mg/dL 13 18 22(H)  Creatinine 0.61 - 1.24 mg/dL 0.75 0.73 0.68  Sodium 135 - 145 mmol/L 129(L) 135 133(L)  Potassium 3.5 - 5.1 mmol/L 3.9 3.7 3.8  Chloride 98 - 111 mmol/L 101 100 101  CO2 22 - 32 mmol/L 21(L) 25 25  Calcium 8.9 - 10.3 mg/dL 7.5(L) 7.5(L) 7.4(L)  Total Protein 6.5 - 8.1 g/dL - - -  Total Bilirubin 0.3 - 1.2 mg/dL - - -  Alkaline Phos 38 - 126 U/L - - -  AST 15 - 41 U/L - - -  ALT 0 - 44 U/L - - -     Imaging studies: No new pertinent imaging studies   Assessment/Plan: 55 y.o. male 4 Days Post-Op s/p Jeanette Caprice' Procedure for perforated diverticulitis with purulent peritonitis   - Will advance to full liquid diet; consider soft diet tonight vs tomorrow pending toleration   - Wean from IVF support as diet advances   - Continue foley catheter for intra-operative bladder injury (repaired); need for ~14 days; follow up with urology at discharge  - Continue surgical drain; monitor and record output; drain creatinine pending as a precaution  - Monitor abdominal examination - Monitor ostomy output; WOC following  - Pain control prn (resumed home medications); antiemetics prn - Okay to mobilize     - Discharge Planning; Pending advancement of diet; anticipate 24-48 hours  All of the above findings and recommendations were discussed with the patient, and the medical team, and all  of patient's questions were answered to his expressed satisfaction.  -- Edison Simon, PA-C [facility] 08/12/2021, 7:41 AM [phone] M-F: 7am - 4pm

## 2021-08-13 ENCOUNTER — Other Ambulatory Visit: Payer: Self-pay | Admitting: Physician Assistant

## 2021-08-13 DIAGNOSIS — Z433 Encounter for attention to colostomy: Secondary | ICD-10-CM

## 2021-08-13 DIAGNOSIS — K5732 Diverticulitis of large intestine without perforation or abscess without bleeding: Secondary | ICD-10-CM

## 2021-08-13 LAB — CBC WITH DIFFERENTIAL/PLATELET
Abs Immature Granulocytes: 0.67 10*3/uL — ABNORMAL HIGH (ref 0.00–0.07)
Basophils Absolute: 0.1 10*3/uL (ref 0.0–0.1)
Basophils Relative: 0 %
Eosinophils Absolute: 0.5 10*3/uL (ref 0.0–0.5)
Eosinophils Relative: 3 %
HCT: 28.8 % — ABNORMAL LOW (ref 39.0–52.0)
Hemoglobin: 9.8 g/dL — ABNORMAL LOW (ref 13.0–17.0)
Immature Granulocytes: 4 %
Lymphocytes Relative: 19 %
Lymphs Abs: 2.9 10*3/uL (ref 0.7–4.0)
MCH: 30.3 pg (ref 26.0–34.0)
MCHC: 34 g/dL (ref 30.0–36.0)
MCV: 89.2 fL (ref 80.0–100.0)
Monocytes Absolute: 1 10*3/uL (ref 0.1–1.0)
Monocytes Relative: 7 %
Neutro Abs: 10 10*3/uL — ABNORMAL HIGH (ref 1.7–7.7)
Neutrophils Relative %: 67 %
Platelets: 480 10*3/uL — ABNORMAL HIGH (ref 150–400)
RBC: 3.23 MIL/uL — ABNORMAL LOW (ref 4.22–5.81)
RDW: 13 % (ref 11.5–15.5)
WBC: 15.2 10*3/uL — ABNORMAL HIGH (ref 4.0–10.5)
nRBC: 0 % (ref 0.0–0.2)

## 2021-08-13 LAB — CULTURE, BLOOD (ROUTINE X 2)
Culture: NO GROWTH
Culture: NO GROWTH

## 2021-08-13 LAB — AEROBIC/ANAEROBIC CULTURE W GRAM STAIN (SURGICAL/DEEP WOUND)

## 2021-08-13 MED ORDER — AMOXICILLIN-POT CLAVULANATE 875-125 MG PO TABS: 1.0000 | ORAL_TABLET | Freq: Two times a day (BID) | ORAL | 0 refills | Status: AC

## 2021-08-13 MED ORDER — OXYCODONE HCL 5 MG PO TABS: 5.0000 mg | ORAL_TABLET | Freq: Four times a day (QID) | ORAL | 0 refills | Status: DC | PRN

## 2021-08-13 NOTE — Progress Notes (Signed)
Patient discharged from unit via wheelchair accompanied by spouse. Patient discharged with all pertinent information, prescriptions and personal belongings.  Patient able to teach back instructions.  IV site's d/ced by primary nurse.  Verified with primary nurse that no other needs at this time.  No acute distress noted. Care relinquished.

## 2021-08-13 NOTE — Discharge Instructions (Signed)
In addition to included general post-operative instructions,  Diet: Resume home diet.   Activity: No heavy lifting >20 pounds (children, pets, laundry, garbage) or strenuous activity for 6 weeks, but light activity and walking are encouraged. Do not drive or drink alcohol if taking narcotic pain medications or having pain that might distract from driving.  Wound care: If you can keep drain site covered, you may shower/get incision wet with soapy water and pat dry (do not rub incisions), but no baths or submerging incision underwater until follow-up.   Medications: Resume all home medications. For mild to moderate pain: acetaminophen (Tylenol) or ibuprofen/naproxen (if no kidney disease). Combining Tylenol with alcohol can substantially increase your risk of causing liver disease. Narcotic pain medications, if prescribed, can be used for severe pain, though may cause nausea, constipation, and drowsiness. Do not combine Tylenol and Percocet (or similar) within a 6 hour period as Percocet (and similar) contain(s) Tylenol. If you do not need the narcotic pain medication, you do not need to fill the prescription.  Call office [phone] / [phone]) at any time if any questions, worsening pain, fevers/chills, bleeding, drainage from incision site, or other concerns.

## 2021-08-13 NOTE — Plan of Care (Signed)

## 2021-08-13 NOTE — Discharge Summary (Signed)
[facility] SURGICAL DISCHARGE SUMMARY   Patient ID: Gregory Oconnell MRN: [id number] DOB/AGE: [birth year] 55 y.o.  Admit date: 08/07/2021 Discharge date: 08/13/2021  Discharge Diagnoses Patient Active Problem List   Diagnosis Date Noted   Diverticulitis of large intestine with perforation and abscess 08/08/2021    Consultants None  Procedures 08/08/2021:  Exploratory Laparotomy  Hartmann's Procedure   HPI: Gregory Oconnell is a 55 y.o. male presenting with a 6-day history of worsening abdominal pain.  The patient reports that his pain started around 08/02/2021 but then on 08/04/2021, the pain got worse and has been worsening ever since.  Patient reports associated nausea and emesis and has not been able to eat much solids since 10/15 but has been able to keep some liquids down.  Also reports initial constipation and needed to take milk of magnesia and MiraLAX in order to have bowel movements over the last 2 days.  His pain is very diffuse but is mostly in the left lower quadrant.  Reports subjective fevers at home with sweats and chills.  He reports over the last few years having intermittent episodes of nausea, vomiting, and abdominal pain and has had extensive work-up including ultrasound and other studies that have not shown the etiology.   In the emergency room, he is afebrile but had low-grade tachycardia which improved with fluids.  His laboratory work-up was remarkable for a white count of 34, normal lactic acid of 1.6, with signs of dehydration with hyponatremia, hypochloremia.  For unclear reason his albumin is 2.3.  He also had a CT scan of the abdomen pelvis with contrast which shows inflammation and inflammatory changes of the left lower quadrant at the sigmoid colon with diffuse areas of intra-abdominal fluid collections and abscesses going between the lateral aspect of the liver, inferior to the bladder, inferior to the left kidney, and throughout other areas  which I do not think would be able to be abnormal for percutaneous drainage.  I personally viewed all of the images and agree with the findings.   Hospital Course: Informed consent was obtained and documented, and patient underwent uneventful Hartmann's Procedure (Dr Hampton Abbot, 08/08/2021).  Post-operatively, patient did have an expectedly degree of ileus and did have NGT decompression. He had return of bowel function on POD3 and passed clamping trial. Advancement of patient's diet and ambulation were well-tolerated. The remainder of patient's hospital course was essentially unremarkable, and discharge planning was initiated accordingly with patient safely able to be discharged home with appropriate discharge instructions, antibiotics (Augmentin x7 days), pain control (he does have chronic back pain, I will give him narcotics for post-operative pain but he understands we can not prescribe these long term. Additionally, he has a history of gastric ulcer, so will not send NSAIDs), and outpatient follow-up after all of his questions were answered to his expressed satisfaction.   Discharge Condition: Good   Physical Examination:  Constitutional: alert, cooperative and no distress  Respiratory: breathing non-labored at rest  Cardiovascular: regular rate and sinus rhythm  Gastrointestinal: Soft, incisional soreness, non-distended, surgical drain in RLQ; output serosanguinous. 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   amoxicillin-clavulanate 875-125 MG tablet  Commonly known as: Augmentin Take 1 tablet by mouth 2 (two) times daily for 7 days.   docusate sodium 100 MG capsule Commonly known as: COLACE Take 100 mg  by mouth 2 (two) times daily.   gabapentin 800 MG tablet Commonly known as: NEURONTIN Take 800 mg by mouth 3 (three) times daily.   methocarbamol 500 MG tablet Commonly known as: ROBAXIN Take 500-1,000 mg by mouth every 6 (six) hours as needed for muscle spasms.   omeprazole 40 MG capsule Commonly known as: PRILOSEC Take 40 mg by mouth 2 (two) times daily.   ondansetron 4 MG disintegrating tablet Commonly known as: ZOFRAN-ODT Take 4 mg by mouth 3 (three) times daily as needed for nausea/vomiting.   oxyCODONE 5 MG immediate release tablet Commonly known as: Oxy IR/ROXICODONE Take 1 tablet (5 mg total) by mouth every 6 (six) hours as needed for severe pain or breakthrough pain.          Follow-up Information     Piscoya, Jacqulyn Bath, MD. Schedule an appointment as soon as possible for a visit in 1 week(s).   Specialty: General Surgery Why: s/p Hartmann's, has staples and drain Contact information: [address] [place] [address] [phone]         Billey Co, MD. Go on 08/21/2021.   Specialty: Urology Why: At 130 PM Contact information: [place] [address] [phone]                  Time spent on discharge management including discussion of hospital course, clinical condition, outpatient instructions, prescriptions, and follow up with the patient and members of the medical team: >30 minutes  -- Edison Simon , PA-C [facility]  08/13/2021, 8:36 AM [phone] M-F: 7am - 4pm

## 2021-08-13 NOTE — Consult Note (Signed)
[place] Nurse ostomy follow up Stoma type/location: LLQ colostomy Stomal assessment/size: 1 3/8" 35 mm- measured today and patient able to cut barrier to fit.  Stoma is round today and os is located at 9:00.  Discussed rationale for barrier ring and how this will improve seal.  Peristomal assessment: intact Treatment options for stomal/peristomal skin: barrier ring and 2 3/4" pouch Output  soft brown stool.  Ostomy pouching: 2pc. 2 3/4" pouch with barrier ring   Education provided: Pouch change performed.  Patient and wife worked together to measure and cut barrier to fit.  He has large fingers that could not easily use small scissors. He snapped pouch and barrier together.  Applied barrier ring and pouch to abdomen and warmed to promote seal.  Demonstrated emptying, cleaning tail of pouch and roll closure.  Discussed showering, twice weekly pouch changes and emptying when 1/3 full.  I provided them with the phone number to the ostomy clinic.  They declined to make appointment today, but will call as needed.  Enrolled patient in [place] Start Discharge program: Yes Will not follow at this time.  Please re-consult if needed. Anticipate discharge today.  Domenic Moras MSN, RN, FNP-BC CWON Wound, Ostomy, Continence Nurse Pager [phone]

## 2021-08-13 NOTE — TOC Transition Note (Signed)
Transition of Care [facility]) - CM/SW Discharge Note   Patient Details  Name: Gregory Oconnell MRN: [id number] Date of Birth: 13-Aug-1966  Transition of Care [facility]) CM/SW Contact:  Beverly Sessions, RN Phone Number: 08/13/2021, 8:59 AM   Clinical Narrative:     Patient to discharge home today [facility] with [facility] notified of discharge Requested [place] orders from PA Bedside RN to send patient home with some ostomy supplies  Final next level of care: [place] Barriers to Discharge: Barriers Resolved   Patient Goals and CMS Choice Patient states their goals for this hospitalization and ongoing recovery are:: home with wife CMS Medicare.gov Compare Post Acute Care list provided to:: Patient Choice offered to / list presented to : Patient  Discharge Placement                       Discharge Plan and Services                          HH Arranged: RN [facility] Agency: [place] Date [facility] Agency Contacted: 08/13/21   Representative spoke with at [place]: [place] ([place]) Interventions     Readmission Risk Interventions No flowsheet data found.

## 2021-08-15 ENCOUNTER — Encounter: Payer: Medicaid Other | Admitting: Surgery

## 2021-08-18 ENCOUNTER — Ambulatory Visit (INDEPENDENT_AMBULATORY_CARE_PROVIDER_SITE_OTHER): Payer: Medicaid Other | Admitting: Surgery

## 2021-08-18 ENCOUNTER — Encounter: Payer: Self-pay | Admitting: Surgery

## 2021-08-18 ENCOUNTER — Other Ambulatory Visit: Payer: Self-pay

## 2021-08-18 VITALS — BP 120/84 | HR 101 | Temp 98.1°F | Ht 72.0 in | Wt 195.8 lb

## 2021-08-18 DIAGNOSIS — Z09 Encounter for follow-up examination after completed treatment for conditions other than malignant neoplasm: Secondary | ICD-10-CM

## 2021-08-18 DIAGNOSIS — K5720 Diverticulitis of large intestine with perforation and abscess without bleeding: Secondary | ICD-10-CM

## 2021-08-18 MED ORDER — OXYCODONE HCL 5 MG PO TABS: 5.0000 mg | ORAL_TABLET | Freq: Four times a day (QID) | ORAL | 0 refills | Status: DC | PRN

## 2021-08-18 NOTE — Patient Instructions (Addendum)
If you have any concerns or questions, please feel free to call our office. See your follow up appointment below.  Wound Closure Removal, Care After This sheet gives you information about how to care for yourself after your stitches (sutures), staples, or skin adhesives have been removed. Your health care provider may also give you more specific instructions. If you have problems or questions, contact your health care provider. What can I expect after the procedure? After your sutures or staples have been removed or your skin adhesives have fallen off, it is common to have: Some discomfort and swelling in the area. Slight redness in the area. Follow these instructions at home: If you have a bandage: Wash your hands with soap and water before you change your bandage (dressing). If soap and water are not available, use hand sanitizer. Change your dressing as told by your health care provider. If your dressing becomes wet or dirty, or develops a bad smell, change it as soon as possible. If your dressing sticks to your skin, pour warm, clean water over it until it loosens and can be removed without pulling apart the wound edges. Pat the area dry with a soft, clean towel. Do not rub the wound because that may cause bleeding. Wound care  Keep the wound area dry and clean. Check your wound every day for signs of infection. Check for: Redness, swelling, or pain. Fluid or blood. Warmth. Pus or a bad smell. Wash your hands with soap and water before and after touching your wound. Apply cream or ointment only as told by your health care provider. If you are using cream or ointment, wash the area with soap and water 2 times a day to remove all the cream or ointment. Rinse off the soap and pat the area dry with a clean towel. If skin glue or adhesive strips were applied after sutures or staples were removed, leave these closures in place until they peel off on their own. If adhesive strip edges start to  loosen and curl up, you may trim the loose edges. Do not remove adhesive strips completely unless your health care provider tells you to do that. Continue to protect the wound from injury. Do not pick at your wound. Picking can cause an infection. Bathing Do not take baths, swim, or use a hot tub until your health care provider approves. Ask your health care provider when it is okay to shower. Follow these steps for showering: If you have a dressing, remove it before getting into the shower. In the shower, allow soapy water to get on the wound. Avoid scrubbing the wound. When you get out of the shower, dry the wound by patting it with a clean towel. Reapply a dressing over the wound if needed. Scar care When your wound has completely healed, take actions to help decrease the size of your scar: Wear sunscreen over the scar or cover it with clothing when you are outside. New scars get sunburned easily, which can make scarring worse. Gently massage the scarred area. This can decrease scar thickness. General instructions Take over-the-counter and prescription medicines only as told by your health care provider. Keep all follow-up visits as told by your health care provider. This is important. Contact a health care provider if: You have redness, swelling, or pain around your wound. You have fluid or blood coming from your wound. Your wound feels warm to the touch. You have pus or a bad smell coming from your wound. Your wound opens  up. You have chills. Get help right away if: You have a fever. You have redness that is spreading from your wound. Summary Change your dressing as told by your health care provider. If your dressing becomes wet or dirty, or develops a bad smell, change it as soon as possible. Check your wound every day for signs of infection. Wash your hands with soap and water before and after touching your wound. This information is not intended to replace advice given to you  by your health care provider. Make sure you discuss any questions you have with your health care provider. Document Revised: 08/01/2020 Document Reviewed: 08/02/2020 Elsevier Patient Education  2022 [employer].

## 2021-08-18 NOTE — Progress Notes (Signed)
08/18/2021  HPI: Gregory Oconnell is a 55 y.o. male s/p Hartman's procedure and repair of bladder injury on 08/08/2021.  Patient was discharged on 10/26 with a JP drain, staples, Penrose drain, and Foley catheter.  Prior to discharge, JP drain fluid was negative for creatinine.  He reports that he has been doing well although still with some discomfort in the lower abdomen.  He reports that he feels pressure issues in the pelvis and he has some times where urine is leaking around the catheter.  Reported the catheter still works well and urine is emptying through it.  Denies any fevers, chills, chest pain, shortness of breath.  Vital signs: BP 120/84   Pulse (!) 101   Temp 98.1 F (36.7 C) (Oral)   Ht 6' (1.829 m)   Wt 195 lb 12.8 oz (88.8 kg)   SpO2 99%   BMI 26.56 kg/m    Physical Exam: Constitutional: No acute distress Abdomen: Soft, nondistended, appropriately tender.  Patient has a right-sided Blake drain with serosanguineous fluid.  Midline incision has staples with Penrose drain going through with some serosanguineous drainage.  Left-sided ostomy with stool and is pink and viable.  Blake drain was removed at bedside without complications.  Staples were removed at bedside without complications.  Penrose drain was removed at bedside without complications.  Steri-Strips were applied instead of the staples.  Dry gauze dressing applied Penrose drain at the drain exit sites as well as the [place] drain exit site.  Assessment/Plan: This is a 55 y.o. male s/p Hartman's procedure and repair of bladder injury.  - Staples, Blake drain, Penrose drain were removed today without complications.  Discussed with the patient doing daily dressing changes until the wounds are closed. - Given the patient's pain, we will also refill the patient's oxycodone. - Patient has a cystogram and appointment with Dr. Ida Rogue on 08/21/2021.  Recommend that he discuss with him some of the discomfort in the pelvic area  which could be bladder spasms.  Hopefully at that time, the catheter will be able to be removed. - Follow-up in 3 weeks.  Melvyn Neth, [facility]

## 2021-08-21 ENCOUNTER — Other Ambulatory Visit: Payer: Self-pay

## 2021-08-21 ENCOUNTER — Ambulatory Visit (INDEPENDENT_AMBULATORY_CARE_PROVIDER_SITE_OTHER): Payer: Medicaid Other | Admitting: Urology

## 2021-08-21 ENCOUNTER — Ambulatory Visit
Admission: RE | Admit: 2021-08-21 | Discharge: 2021-08-21 | Disposition: A | Discharge status: Home / Self Care | Payer: Medicaid Other | Source: Ambulatory Visit | Attending: Urology | Admitting: Urology

## 2021-08-21 ENCOUNTER — Encounter: Payer: Self-pay | Admitting: Urology

## 2021-08-21 VITALS — BP 100/69 | HR 94 | Ht 72.0 in | Wt 190.0 lb

## 2021-08-21 DIAGNOSIS — S3720XD Unspecified injury of bladder, subsequent encounter: Secondary | ICD-10-CM | POA: Diagnosis not present

## 2021-08-21 DIAGNOSIS — Z466 Encounter for fitting and adjustment of urinary device: Secondary | ICD-10-CM

## 2021-08-21 DIAGNOSIS — S3720XS Unspecified injury of bladder, sequela: Secondary | ICD-10-CM | POA: Diagnosis present

## 2021-08-21 DIAGNOSIS — N9989 Other postprocedural complications and disorders of genitourinary system: Secondary | ICD-10-CM | POA: Diagnosis not present

## 2021-08-21 MED ORDER — CEPHALEXIN 250 MG PO CAPS
500.0000 mg | ORAL_CAPSULE | Freq: Once | ORAL | Status: AC
  Administered 2021-08-21: 500 mg via ORAL

## 2021-08-21 MED ORDER — IOTHALAMATE MEGLUMINE 17.2 % UR SOLN
250.0000 mL | Freq: Once | URETHRAL | Status: AC | PRN
  Administered 2021-08-21: 250 mL

## 2021-08-21 NOTE — Progress Notes (Signed)
   08/21/21 4:42 PM   Radene Knee 01/23/1966 [id number]  CC: Bladder injury  HPI: 55 year old male who was admitted on 08/08/2021 with perforated diverticulitis and underwent an exploratory laparotomy and Hartman's procedure with Dr. Hampton Abbot.  There was an iatrogenic bladder injury at that time and Dr. Hampton Abbot repaired it in 2 layers with Vicryl suture and left the catheter in place.  Urology was contacted for follow-up.  He had a drain postoperatively and drain creatinine was checked and was normal, and drain was recently removed.  I reviewed the operative note from Dr. Hampton Abbot, as well as his recent clinic note from 10/31.  Patient has been doing well with the catheter in place and it remains amber to yellow in color.  He denies any urinary symptoms prior to surgery.   PMH: Past Medical History:  Diagnosis Date   Arthritis    Chronic back pain    GERD (gastroesophageal reflux disease)    Kidney disease     Surgical History: Past Surgical History:  Procedure Laterality Date   BLADDER REPAIR N/A 08/08/2021   Procedure: BLADDER REPAIR injury;  Surgeon: Olean Ree, MD;  Location: [facility] ORS;  Service: General;  Laterality: N/A;   COLECTOMY WITH COLOSTOMY CREATION/HARTMANN PROCEDURE N/A 08/08/2021   Procedure: COLECTOMY WITH COLOSTOMY CREATION/HARTMANN PROCEDURE;  Surgeon: Olean Ree, MD;  Location: [facility] ORS;  Service: General;  Laterality: N/A;   COLONOSCOPY WITH PROPOFOL N/A 02/03/2016   Procedure: COLONOSCOPY WITH PROPOFOL;  Surgeon: Hulen Luster, MD;  Location: [facility] ENDOSCOPY;  Service: Gastroenterology;  Laterality: N/A;   ESOPHAGOGASTRODUODENOSCOPY (EGD) WITH PROPOFOL N/A 02/03/2016   Procedure: ESOPHAGOGASTRODUODENOSCOPY (EGD) WITH PROPOFOL;  Surgeon: Hulen Luster, MD;  Location: [facility] ENDOSCOPY;  Service: Gastroenterology;  Laterality: N/A;   KNEE SURGERY     LAPAROTOMY N/A 08/08/2021   Procedure: EXPLORATORY LAPAROTOMY;  Surgeon: Olean Ree, MD;  Location: [facility] ORS;   Service: General;  Laterality: N/A;    Family History: No family history on file.  Social History:  reports that he has never smoked. He has never used smokeless tobacco. He reports that he does not currently use alcohol. He reports current drug use. Drug: Marijuana.  Physical Exam: BP 100/69   Pulse 94   Ht 6' (1.829 m)   Wt 190 lb (86.2 kg)   BMI 25.77 kg/m    Constitutional:  Alert and oriented, No acute distress. Cardiovascular: No clubbing, cyanosis, or edema. Respiratory: Normal respiratory effort, no increased work of breathing. GI: Abdomen is soft, nontender, nondistended, no abdominal masses GU: Foley with amber urine  Pertinent Imaging: I have personally viewed and interpreted the cystogram showing no evidence of bladder leak.  Assessment & Plan:   55 year old male who underwent an emergent exploratory laparotomy and Hartman's procedure with Dr. Hampton Abbot on 08/08/2021 for perforated diverticulitis with iatrogenic 1 cm bladder injury at that time repaired in 2 layers.  Cystogram reviewed today and shows no evidence of persistent leak, and his Foley was removed in clinic.  He was given Keflex prophylaxis prior to catheter removal.  Return precautions discussed extensively.  Can follow-up with urology as needed.  I updated Dr. Hampton Abbot with the cystogram findings today.  Nickolas Madrid, MD 08/21/2021  [facility] [address] [place], [place] [address] [phone]

## 2021-08-22 ENCOUNTER — Ambulatory Visit (HOSPITAL_COMMUNITY): Payer: Medicaid Other

## 2021-08-27 ENCOUNTER — Other Ambulatory Visit: Payer: Self-pay

## 2021-08-27 ENCOUNTER — Encounter: Payer: Self-pay | Admitting: Emergency Medicine

## 2021-08-27 ENCOUNTER — Inpatient Hospital Stay
Admission: EM | Admit: 2021-08-27 | Discharge: 2021-08-30 | DRG: 444 | Disposition: A | Discharge status: Home / Self Care | Payer: Medicaid Other | Attending: Surgery | Admitting: Surgery

## 2021-08-27 ENCOUNTER — Emergency Department: Payer: Medicaid Other

## 2021-08-27 DIAGNOSIS — J690 Pneumonitis due to inhalation of food and vomit: Secondary | ICD-10-CM | POA: Diagnosis present

## 2021-08-27 DIAGNOSIS — F331 Major depressive disorder, recurrent, moderate: Secondary | ICD-10-CM | POA: Diagnosis present

## 2021-08-27 DIAGNOSIS — Z79899 Other long term (current) drug therapy: Secondary | ICD-10-CM

## 2021-08-27 DIAGNOSIS — J189 Pneumonia, unspecified organism: Secondary | ICD-10-CM

## 2021-08-27 DIAGNOSIS — R1011 Right upper quadrant pain: Secondary | ICD-10-CM

## 2021-08-27 DIAGNOSIS — R109 Unspecified abdominal pain: Secondary | ICD-10-CM

## 2021-08-27 DIAGNOSIS — R52 Pain, unspecified: Secondary | ICD-10-CM

## 2021-08-27 DIAGNOSIS — R7989 Other specified abnormal findings of blood chemistry: Secondary | ICD-10-CM | POA: Diagnosis present

## 2021-08-27 DIAGNOSIS — K219 Gastro-esophageal reflux disease without esophagitis: Secondary | ICD-10-CM | POA: Diagnosis present

## 2021-08-27 DIAGNOSIS — Z20822 Contact with and (suspected) exposure to covid-19: Secondary | ICD-10-CM | POA: Diagnosis present

## 2021-08-27 DIAGNOSIS — K8000 Calculus of gallbladder with acute cholecystitis without obstruction: Principal | ICD-10-CM | POA: Diagnosis present

## 2021-08-27 DIAGNOSIS — Z933 Colostomy status: Secondary | ICD-10-CM

## 2021-08-27 DIAGNOSIS — K819 Cholecystitis, unspecified: Secondary | ICD-10-CM

## 2021-08-27 LAB — COMPREHENSIVE METABOLIC PANEL
ALT: 100 U/L — ABNORMAL HIGH (ref 0–44)
AST: 27 U/L (ref 15–41)
Albumin: 2.6 g/dL — ABNORMAL LOW (ref 3.5–5.0)
Alkaline Phosphatase: 175 U/L — ABNORMAL HIGH (ref 38–126)
Anion gap: 6 (ref 5–15)
BUN: 12 mg/dL (ref 6–20)
CO2: 26 mmol/L (ref 22–32)
Calcium: 7.8 mg/dL — ABNORMAL LOW (ref 8.9–10.3)
Chloride: 100 mmol/L (ref 98–111)
Creatinine, Ser: 0.67 mg/dL (ref 0.61–1.24)
GFR, Estimated: >60 mL/min (ref >60)
Glucose, Bld: 186 mg/dL — ABNORMAL HIGH (ref 70–99)
Potassium: 3.7 mmol/L (ref 3.5–5.1)
Sodium: 132 mmol/L — ABNORMAL LOW (ref 135–145)
Total Bilirubin: 0.7 mg/dL (ref 0.3–1.2)
Total Protein: 7 g/dL (ref 6.5–8.1)

## 2021-08-27 LAB — CBC
HCT: 27.3 % — ABNORMAL LOW (ref 39.0–52.0)
Hemoglobin: 8.9 g/dL — ABNORMAL LOW (ref 13.0–17.0)
MCH: 28.7 pg (ref 26.0–34.0)
MCHC: 32.6 g/dL (ref 30.0–36.0)
MCV: 88.1 fL (ref 80.0–100.0)
Platelets: 319 10*3/uL (ref 150–400)
RBC: 3.1 MIL/uL — ABNORMAL LOW (ref 4.22–5.81)
RDW: 13.9 % (ref 11.5–15.5)
WBC: 15.6 10*3/uL — ABNORMAL HIGH (ref 4.0–10.5)
nRBC: 0 % (ref 0.0–0.2)

## 2021-08-27 LAB — LIPASE, BLOOD: Lipase: 28 U/L (ref 11–51)

## 2021-08-27 LAB — LACTIC ACID, PLASMA: Lactic Acid, Venous: 1.2 mmol/L (ref 0.5–1.9)

## 2021-08-27 MED ORDER — METOCLOPRAMIDE HCL 5 MG/ML IJ SOLN
10.0000 mg | Freq: Once | INTRAMUSCULAR | Status: AC
  Administered 2021-08-27: 10 mg via INTRAVENOUS
  Filled 2021-08-27: qty 2

## 2021-08-27 MED ORDER — MORPHINE SULFATE (PF) 4 MG/ML IV SOLN
4.0000 mg | Freq: Once | INTRAVENOUS | Status: AC
  Administered 2021-08-27: 4 mg via INTRAVENOUS
  Filled 2021-08-27: qty 1

## 2021-08-27 MED ORDER — IOHEXOL 300 MG/ML  SOLN
100.0000 mL | Freq: Once | INTRAMUSCULAR | Status: AC | PRN
  Administered 2021-08-27: 100 mL via INTRAVENOUS

## 2021-08-27 MED ORDER — ONDANSETRON HCL 4 MG/2ML IJ SOLN
4.0000 mg | Freq: Once | INTRAMUSCULAR | Status: AC
  Administered 2021-08-27: 4 mg via INTRAVENOUS
  Filled 2021-08-27: qty 2

## 2021-08-27 MED ORDER — SODIUM CHLORIDE 0.9 % IV BOLUS
1000.0000 mL | Freq: Once | INTRAVENOUS | Status: AC
  Administered 2021-08-27: 1000 mL via INTRAVENOUS

## 2021-08-27 MED ORDER — HYDROMORPHONE HCL 1 MG/ML IJ SOLN
1.0000 mg | Freq: Once | INTRAMUSCULAR | Status: AC
  Administered 2021-08-27: 1 mg via INTRAVENOUS
  Filled 2021-08-27: qty 1

## 2021-08-27 NOTE — ED Triage Notes (Signed)
Pt to ED via EMS from home c/o upper mid abd pain that is sharp, nausea and vomiting x1, denies diarrhea, denies urinary changes.  Pt had colostomy placed 3 weeks ago d/t diverticulitis.  Pt states pain came on all of a sudden today.  Pt moaning in triage and appears uncomfortable, chest rise even and unlabored.

## 2021-08-27 NOTE — ED Notes (Signed)
US @ the bedside. 

## 2021-08-27 NOTE — ED Provider Notes (Addendum)
[facility]  ____________________________________________   Event Date/Time   First MD Initiated Contact with Patient 08/27/21 2154     (approximate)  I have reviewed the triage vital signs and the nursing notes.   HISTORY  Chief Complaint Abdominal Pain    HPI Gregory Oconnell is a 55 y.o. male with past medical history of GERD, chronic back pain and arthritis and complicated diverticulitis status post Jeanette Caprice procedure recently who presents with abdominal pain.  Patient had diverticulitis complicated by multiple intra-abdominal abscesses requiring a Hartmann procedure with colostomy.  He was discharged on 10/30.  Was doing okay at home but then today several hours prior to arrival developed acute onset of poorly localized abdominal pain.  Pain is diffuse and constant.  He did have several episodes of emesis.  He denies any change in his ostomy output.  Denies any fevers or chills.  Urinary symptoms.         Past Medical History:  Diagnosis Date   Arthritis    Chronic back pain    GERD (gastroesophageal reflux disease)    Kidney disease     Patient Active Problem List   Diagnosis Date Noted   Diverticulitis of large intestine with perforation and abscess without bleeding 08/08/2021    Past Surgical History:  Procedure Laterality Date   BLADDER REPAIR N/A 08/08/2021   Procedure: BLADDER REPAIR injury;  Surgeon: Olean Ree, MD;  Location: [facility] ORS;  Service: General;  Laterality: N/A;   COLECTOMY WITH COLOSTOMY CREATION/HARTMANN PROCEDURE N/A 08/08/2021   Procedure: COLECTOMY WITH COLOSTOMY CREATION/HARTMANN PROCEDURE;  Surgeon: Olean Ree, MD;  Location: [facility] ORS;  Service: General;  Laterality: N/A;   COLONOSCOPY WITH PROPOFOL N/A 02/03/2016   Procedure: COLONOSCOPY WITH PROPOFOL;  Surgeon: Hulen Luster, MD;  Location: [facility] ENDOS[facility];  Service: Gastroenterology;  Laterality: N/A;   ESOPHAGOGASTRODUODENOSCOPY (EGD) WITH PROPOFOL N/A 02/03/2016    Procedure: ESOPHAGOGASTRODUODENOSCOPY (EGD) WITH PROPOFOL;  Surgeon: Hulen Luster, MD;  Location: [facility];  Service: Gastroenterology;  Laterality: N/A;   KNEE SURGERY     LAPAROTOMY N/A 08/08/2021   Procedure: EXPLORATORY LAPAROTOMY;  Surgeon: Olean Ree, MD;  Location: [facility] ORS;  Service: General;  Laterality: N/A;    Prior to Admission medications   Medication Sig Start Date End Date Taking? Authorizing Provider  amLODipine (NORVASC) 5 MG tablet Take 5 mg by mouth daily.    [provider]  docusate sodium (COLACE) 100 MG capsule Take 100 mg by mouth 2 (two) times daily.    [provider]  gabapentin (NEURONTIN) 800 MG tablet Take 800 mg by mouth 3 (three) times daily.    [provider]  methocarbamol (ROBAXIN) 500 MG tablet Take 500-1,000 mg by mouth every 6 (six) hours as needed for muscle spasms.    [provider]  omeprazole (PRILOSEC) 40 MG capsule Take 40 mg by mouth 2 (two) times daily.    [provider]  ondansetron (ZOFRAN-ODT) 4 MG disintegrating tablet Take 4 mg by mouth 3 (three) times daily as needed for nausea/vomiting.    [provider]  oxyCODONE (OXY IR/ROXICODONE) 5 MG immediate release tablet Take 1 tablet (5 mg total) by mouth every 6 (six) hours as needed for severe pain or breakthrough pain. 08/18/21   Olean Ree, MD    Allergies Patient has no known allergies.  History reviewed. No pertinent family history.  Social History Social History   Tobacco Use   Smoking status: Never   Smokeless tobacco:  Never  Substance Use Topics   Alcohol use: Not Currently    Comment: several beers every other week   Drug use: Yes    Types: Marijuana    Review of Systems   Review of Systems  Constitutional:  Negative for chills and fever.  Respiratory:  Negative for shortness of breath.   Gastrointestinal:  Positive for abdominal pain, nausea and vomiting. Negative for constipation and diarrhea.   Genitourinary:  Negative for dysuria.  All other systems reviewed and are negative.  Physical Exam Updated Vital Signs BP 135/76   Pulse 95   Temp 98.2 F (36.8 C) (Oral)   Resp 18   Ht 6' (1.829 m)   Wt 86.2 kg   SpO2 98%   BMI 25.77 kg/m   Physical Exam Vitals and nursing note reviewed.  Constitutional:      General: He is in acute distress.     Appearance: Normal appearance.     Comments: Patient is significantly distressed, groaning in pain  HENT:     Head: Normocephalic and atraumatic.  Eyes:     General: No scleral icterus.    Conjunctiva/sclera: Conjunctivae normal.  Pulmonary:     Effort: Pulmonary effort is normal. No respiratory distress.     Breath sounds: Normal breath sounds. No wheezing.  Abdominal:     General: Abdomen is flat.     Comments: Midline vertical abdominal incision is well healed, colostomy in place with brown stool Abdomen is soft throughout but does have diffuse tenderness, poorly localized  Musculoskeletal:        General: No deformity or signs of injury.     Cervical back: Normal range of motion.  Skin:    Coloration: Skin is not jaundiced or pale.  Neurological:     General: No focal deficit present.     Mental Status: He is alert and oriented to person, place, and time. Mental status is at baseline.  Psychiatric:        Mood and Affect: Mood normal.        Behavior: Behavior normal.     LABS (all labs ordered are listed, but only abnormal results are displayed)  Labs Reviewed  COMPREHENSIVE METABOLIC PANEL - Abnormal; Notable for the following components:      Result Value   Sodium 132 (*)    Glucose, Bld 186 (*)    Calcium 7.8 (*)    Albumin 2.6 (*)    ALT 100 (*)    Alkaline Phosphatase 175 (*)    All other components within normal limits  CBC - Abnormal; Notable for the following components:   WBC 15.6 (*)    RBC 3.10 (*)    Hemoglobin 8.9 (*)    HCT 27.3 (*)    All other components within normal limits  LIPASE,  BLOOD  LACTIC ACID, PLASMA  URINALYSIS, ROUTINE W REFLEX MICROSCOPIC  LACTIC ACID, PLASMA   ____________________________________________  EKG  N/a ____________________________________________  RADIOLOGY Almeta Monas, personally viewed and evaluated these images (plain radiographs) as part of my medical decision making, as well as reviewing the written report by the radiologist.  ED MD interpretation:  CT A/P reviewed by myself shows a distended gallbladder but otherwise no acute findings patient   ____________________________________________   PROCEDURES  Procedure(s) performed (including Critical Care):  Procedures   ____________________________________________   INITIAL IMPRESSION / ASSESSMENT AND PLAN / ED COURSE     Patient is a 55 year old male who recently underwent a Jeanette Caprice  procedure for complicated diverticulitis about 18 days ago presents with acute abdominal pain tonight.  Vital signs within normal limits.  He appears very uncomfortable and is groaning in pain.  Overall his abdomen is soft but he is diffusely tender.  There are no peritoneal signs.  Ostomy looks good.  Plan to obtain labs and obtain a CT abdomen pelvis including a lactate given his pain seems somewhat out of proportion to his abdominal exam.  Will treat with morphine Zofran and fluids.  CT scan shows a distended gallbladder with some wall thickening, no obvious perforation or other process in his bowels. We will obtain a right upper quadrant ultrasound.  On reassessment patient still significantly uncomfortable we will give him Dilaudid and Reglan.  At the time of signout he is pending a right upper quadrant ultrasound and reassessment.     ____________________________________________   FINAL CLINICAL IMPRESSION(S) / ED DIAGNOSES  Final diagnoses:  Abdominal pain, unspecified abdominal location  RUQ pain     ED Discharge Orders     None        Note:  This document was  prepared using Dragon voice recognition software and may include unintentional dictation errors.    Rada Hay, MD 08/27/21 2259    Rada Hay, MD 08/27/21 [phone]

## 2021-08-27 NOTE — ED Notes (Signed)
Pt to CT

## 2021-08-28 ENCOUNTER — Encounter: Payer: Self-pay | Admitting: Surgery

## 2021-08-28 ENCOUNTER — Inpatient Hospital Stay: Payer: Medicaid Other | Admitting: Radiology

## 2021-08-28 ENCOUNTER — Encounter
Admission: EM | Disposition: A | Discharge status: Home / Self Care | Payer: Self-pay | Source: Home / Self Care | Attending: Surgery

## 2021-08-28 DIAGNOSIS — R7989 Other specified abnormal findings of blood chemistry: Secondary | ICD-10-CM | POA: Diagnosis present

## 2021-08-28 DIAGNOSIS — J690 Pneumonitis due to inhalation of food and vomit: Secondary | ICD-10-CM

## 2021-08-28 DIAGNOSIS — J189 Pneumonia, unspecified organism: Secondary | ICD-10-CM | POA: Diagnosis not present

## 2021-08-28 DIAGNOSIS — K810 Acute cholecystitis: Secondary | ICD-10-CM | POA: Diagnosis present

## 2021-08-28 DIAGNOSIS — Z20822 Contact with and (suspected) exposure to covid-19: Secondary | ICD-10-CM | POA: Diagnosis present

## 2021-08-28 DIAGNOSIS — Z79899 Other long term (current) drug therapy: Secondary | ICD-10-CM | POA: Diagnosis not present

## 2021-08-28 DIAGNOSIS — K819 Cholecystitis, unspecified: Secondary | ICD-10-CM

## 2021-08-28 DIAGNOSIS — F331 Major depressive disorder, recurrent, moderate: Secondary | ICD-10-CM | POA: Diagnosis present

## 2021-08-28 DIAGNOSIS — K8000 Calculus of gallbladder with acute cholecystitis without obstruction: Secondary | ICD-10-CM | POA: Diagnosis present

## 2021-08-28 DIAGNOSIS — Z933 Colostomy status: Secondary | ICD-10-CM | POA: Diagnosis not present

## 2021-08-28 DIAGNOSIS — K219 Gastro-esophageal reflux disease without esophagitis: Secondary | ICD-10-CM | POA: Diagnosis present

## 2021-08-28 HISTORY — PX: IR PERC CHOLECYSTOSTOMY: IMG2326

## 2021-08-28 LAB — URINALYSIS, ROUTINE W REFLEX MICROSCOPIC
Bilirubin Urine: NEGATIVE
Glucose, UA: 50 mg/dL — AB
Hgb urine dipstick: NEGATIVE
Ketones, ur: NEGATIVE mg/dL
Leukocytes,Ua: NEGATIVE
Nitrite: NEGATIVE
Protein, ur: NEGATIVE mg/dL
Specific Gravity, Urine: 1.042 — ABNORMAL HIGH (ref 1.005–1.030)
pH: 5 (ref 5.0–8.0)

## 2021-08-28 LAB — PROTIME-INR
INR: 1.1 (ref 0.8–1.2)
Prothrombin Time: 14.5 seconds (ref 11.4–15.2)

## 2021-08-28 LAB — RESP PANEL BY RT-PCR (FLU A&B, COVID) ARPGX2
Influenza A by PCR: NEGATIVE
Influenza B by PCR: NEGATIVE
SARS Coronavirus 2 by RT PCR: NEGATIVE

## 2021-08-28 LAB — LACTIC ACID, PLASMA: Lactic Acid, Venous: 2.1 mmol/L (ref 0.5–1.9)

## 2021-08-28 SURGERY — CHOLECYSTECTOMY, ROBOT-ASSISTED, LAPAROSCOPIC
Anesthesia: General

## 2021-08-28 MED ORDER — HEPARIN SODIUM (PORCINE) 5000 UNIT/ML IJ SOLN: 5000.0000 [IU] | Freq: Three times a day (TID) | INTRAMUSCULAR | Status: DC

## 2021-08-28 MED ORDER — HYDROMORPHONE HCL 1 MG/ML IJ SOLN
1.0000 mg | Freq: Once | INTRAMUSCULAR | Status: AC
  Administered 2021-08-28: 1 mg via INTRAVENOUS
  Filled 2021-08-28: qty 1

## 2021-08-28 MED ORDER — IOHEXOL 350 MG/ML SOLN
4.0000 mL | Freq: Once | INTRAVENOUS | Status: AC | PRN
  Administered 2021-08-28: 4 mL

## 2021-08-28 MED ORDER — HYDROMORPHONE HCL 1 MG/ML IJ SOLN
1.0000 mg | INTRAMUSCULAR | Status: DC | PRN
  Administered 2021-08-28 (×2): 1 mg via INTRAVENOUS
  Filled 2021-08-28 (×2): qty 1

## 2021-08-28 MED ORDER — DROPERIDOL 2.5 MG/ML IJ SOLN
1.2500 mg | Freq: Once | INTRAMUSCULAR | Status: AC
  Administered 2021-08-28: 1.25 mg via INTRAVENOUS
  Filled 2021-08-28: qty 2

## 2021-08-28 MED ORDER — ACETAMINOPHEN 500 MG PO TABS
1000.0000 mg | ORAL_TABLET | Freq: Four times a day (QID) | ORAL | Status: DC
  Administered 2021-08-28 – 2021-08-30 (×8): 1000 mg via ORAL
  Filled 2021-08-28 (×8): qty 2

## 2021-08-28 MED ORDER — ONDANSETRON 4 MG PO TBDP
4.0000 mg | ORAL_TABLET | Freq: Four times a day (QID) | ORAL | Status: DC | PRN
  Filled 2021-08-28: qty 1

## 2021-08-28 MED ORDER — ONDANSETRON HCL 4 MG/2ML IJ SOLN: 4.0000 mg | Freq: Four times a day (QID) | INTRAMUSCULAR | Status: DC | PRN

## 2021-08-28 MED ORDER — KETOROLAC TROMETHAMINE 30 MG/ML IJ SOLN
30.0000 mg | Freq: Four times a day (QID) | INTRAMUSCULAR | Status: DC
  Administered 2021-08-28 – 2021-08-30 (×8): 30 mg via INTRAVENOUS
  Filled 2021-08-28 (×8): qty 1

## 2021-08-28 MED ORDER — METHOCARBAMOL 500 MG PO TABS
500.0000 mg | ORAL_TABLET | Freq: Four times a day (QID) | ORAL | Status: DC | PRN
  Filled 2021-08-28: qty 1

## 2021-08-28 MED ORDER — PIPERACILLIN-TAZOBACTAM 3.375 G IVPB
3.3750 g | Freq: Three times a day (TID) | INTRAVENOUS | Status: DC
  Administered 2021-08-28: 3.375 g via INTRAVENOUS
  Filled 2021-08-28: qty 50

## 2021-08-28 MED ORDER — PANTOPRAZOLE SODIUM 40 MG IV SOLR: 40.0000 mg | Freq: Every day | INTRAVENOUS | Status: DC

## 2021-08-28 MED ORDER — ENOXAPARIN SODIUM 40 MG/0.4ML IJ SOSY
40.0000 mg | PREFILLED_SYRINGE | INTRAMUSCULAR | Status: DC
  Administered 2021-08-29 – 2021-08-30 (×2): 40 mg via SUBCUTANEOUS
  Filled 2021-08-28 (×3): qty 0.4

## 2021-08-28 MED ORDER — HYDROMORPHONE HCL 1 MG/ML IJ SOLN: 1.0000 mg | INTRAMUSCULAR | Status: DC | PRN

## 2021-08-28 MED ORDER — FENTANYL CITRATE (PF) 100 MCG/2ML IJ SOLN
INTRAMUSCULAR | Status: AC
  Filled 2021-08-28: qty 2

## 2021-08-28 MED ORDER — OXYCODONE HCL 5 MG PO TABS
5.0000 mg | ORAL_TABLET | ORAL | Status: DC | PRN
  Administered 2021-08-28 – 2021-08-30 (×4): 5 mg via ORAL
  Filled 2021-08-28 (×4): qty 1

## 2021-08-28 MED ORDER — AMLODIPINE BESYLATE 5 MG PO TABS
5.0000 mg | ORAL_TABLET | Freq: Every day | ORAL | Status: DC
  Administered 2021-08-28 – 2021-08-30 (×3): 5 mg via ORAL
  Filled 2021-08-28 (×4): qty 1

## 2021-08-28 MED ORDER — AMLODIPINE BESYLATE 5 MG PO TABS: 5.0000 mg | ORAL_TABLET | Freq: Every day | ORAL | Status: DC

## 2021-08-28 MED ORDER — SODIUM CHLORIDE 0.9 % IV SOLN
1.0000 g | Freq: Once | INTRAVENOUS | Status: AC
  Administered 2021-08-28: 1 g via INTRAVENOUS
  Filled 2021-08-28: qty 10

## 2021-08-28 MED ORDER — OXYCODONE HCL 5 MG PO TABS: 5.0000 mg | ORAL_TABLET | ORAL | Status: DC | PRN

## 2021-08-28 MED ORDER — LIDOCAINE HCL 1 % IJ SOLN
INTRAMUSCULAR | Status: AC
  Administered 2021-08-28: 20 mL
  Filled 2021-08-28: qty 20

## 2021-08-28 MED ORDER — FENTANYL CITRATE (PF) 100 MCG/2ML IJ SOLN
INTRAMUSCULAR | Status: AC | PRN
  Administered 2021-08-28: 25 ug via INTRAVENOUS

## 2021-08-28 MED ORDER — MIDAZOLAM HCL 2 MG/2ML IJ SOLN
INTRAMUSCULAR | Status: AC | PRN
  Administered 2021-08-28: 1 mg via INTRAVENOUS

## 2021-08-28 MED ORDER — GABAPENTIN 400 MG PO CAPS
800.0000 mg | ORAL_CAPSULE | Freq: Three times a day (TID) | ORAL | Status: DC
  Administered 2021-08-28 – 2021-08-30 (×6): 800 mg via ORAL
  Filled 2021-08-28: qty 2
  Filled 2021-08-28: qty 8
  Filled 2021-08-28 (×4): qty 2
  Filled 2021-08-28: qty 8
  Filled 2021-08-28: qty 2
  Filled 2021-08-28: qty 8
  Filled 2021-08-28: qty 2
  Filled 2021-08-28: qty 8
  Filled 2021-08-28 (×2): qty 2
  Filled 2021-08-28: qty 8

## 2021-08-28 MED ORDER — PANTOPRAZOLE SODIUM 40 MG IV SOLR
40.0000 mg | Freq: Every day | INTRAVENOUS | Status: DC
  Administered 2021-08-28 – 2021-08-29 (×2): 40 mg via INTRAVENOUS
  Filled 2021-08-28 (×2): qty 40

## 2021-08-28 MED ORDER — MIDAZOLAM HCL 2 MG/2ML IJ SOLN
INTRAMUSCULAR | Status: AC
  Filled 2021-08-28: qty 2

## 2021-08-28 MED ORDER — PIPERACILLIN-TAZOBACTAM 3.375 G IVPB
3.3750 g | Freq: Three times a day (TID) | INTRAVENOUS | Status: DC
  Administered 2021-08-28 – 2021-08-30 (×7): 3.375 g via INTRAVENOUS
  Filled 2021-08-28 (×7): qty 50

## 2021-08-28 MED ORDER — KETOROLAC TROMETHAMINE 30 MG/ML IJ SOLN
30.0000 mg | Freq: Four times a day (QID) | INTRAMUSCULAR | Status: DC | PRN
  Administered 2021-08-28: 30 mg via INTRAVENOUS
  Filled 2021-08-28: qty 1

## 2021-08-28 MED ORDER — GABAPENTIN 400 MG PO CAPS
800.0000 mg | ORAL_CAPSULE | Freq: Three times a day (TID) | ORAL | Status: DC
  Filled 2021-08-28: qty 2

## 2021-08-28 MED ORDER — SODIUM CHLORIDE 0.9 % IV SOLN
500.0000 mg | Freq: Once | INTRAVENOUS | Status: AC
  Administered 2021-08-28: 500 mg via INTRAVENOUS
  Filled 2021-08-28: qty 500

## 2021-08-28 MED ORDER — POLYETHYLENE GLYCOL 3350 17 G PO PACK
17.0000 g | PACK | Freq: Every day | ORAL | Status: DC | PRN
  Filled 2021-08-28: qty 1

## 2021-08-28 MED ORDER — HYDROMORPHONE HCL 1 MG/ML IJ SOLN
1.0000 mg | INTRAMUSCULAR | Status: DC | PRN
  Administered 2021-08-28 – 2021-08-30 (×5): 1 mg via INTRAVENOUS
  Filled 2021-08-28 (×5): qty 1

## 2021-08-28 MED ORDER — LACTATED RINGERS IV SOLN: INTRAVENOUS | Status: DC

## 2021-08-28 MED ORDER — SODIUM CHLORIDE 0.9 % IV SOLN: INTRAVENOUS | Status: DC

## 2021-08-28 NOTE — ED Provider Notes (Signed)
-----------------------------------------   12:46 AM on 08/28/2021 -----------------------------------------   Patient yelling in pain after ultrasound.  IV Dilaudid reordered, pain improved.  Chest x-ray interpreted per Dr. Francoise Ceo: Patchy consolidation and ground-glass opacity in the left mid to lower lung suspect for pneumonia. Radiographic follow-up to resolution is recommended   Ultrasound interpreted per Dr. [place] Pink: FINDINGS: Gallbladder:  Layering gallstones with gallbladder sludge. Gallbladder wall thickening with pericholecystic fluid. Positive sonographic Murphy's sign.  Common bile duct:  Diameter: 5 mm  Liver:  At the upper limits of normal for parenchymal echogenicity. No focal hepatic lesion is seen. Portal vein is patent on color Doppler imaging with normal direction of blood flow towards the liver.  Other: None.  IMPRESSION: Cholelithiasis with findings suspicious for acute cholecystitis, as above.  ----------------------------------------- 1:05 AM on 08/28/2021 -----------------------------------------   Discussed case with surgery on-call Dr. Christian Mate who will admit patient.  Patient and spouse updated and agreeable with plan of care.  Patient with intractable pain; will trial low-dose IV droperidol.     Paulette Blanch, MD 08/28/21 [phone]

## 2021-08-28 NOTE — Procedures (Signed)
Interventional Radiology Procedure:   Indications: Acute cholecystitis and not a surgical candidate at this time.  Procedure: Percutaneous cholecystostomy tube placement  Findings: Distended gallbladder.  Placed 10 Fr drain and removed dark bile. Fluid sent for culture.    Complications: No immediate complications noted.     EBL: Minimal  Plan: Follow output.     Alverda Nazzaro R. Anselm Pancoast, MD  Pager: [phone]

## 2021-08-28 NOTE — ED Notes (Signed)
Pt placed on 2L [place] by Dr. Beather Arbour for comfort.

## 2021-08-28 NOTE — ED Notes (Signed)
Alycia RN aware of assigned bed

## 2021-08-28 NOTE — Sedation Documentation (Signed)
Report called to RN on floor: [place]. Pt. VSS. Stable for tx  Chole tube clean, dry, intact with dark, thick "sludge" type drainage to drainage bag.

## 2021-08-28 NOTE — H&P (Signed)
Date of Admission:  08/28/2021  Reason for Admission:  Acute cholecystitis  History of Present Illness: Gregory Oconnell is a 55 y.o. male presenting to the ER last night with acute onset of abdominal pain, associated with nausea and vomiting.  The patient reports that yesterday afternoon he started having pain in the right side of abdomen, near periumbilical region.  He was also belching at home and eventually progressed to emesis.  He is recently s/p exploratory laparotomy and Hartmann's procedure for perforated diverticulitis with significant purulent peritonitis.  He had an iatrogenic bladder injury during the surgery which was repaired primarily and with a negative cystogram last week.    He presented to the ER and initial workup showed a WBC of 15.6, Hgb 8.9, Cr 0.67, with total bili of 0.7, AST 27, ALT 100, Alk Phos 175, and albumin 2.6.  CT scan showed a distended gallbladder with edematous appearance and post-op changes from his surgery.  U/S showed gallbladder wall thickening with pericholecystic fluid and positive Murphy's sign.  He also had a CXR which showed a left mid and lower lung pneumonia.  His O2 sat did decrease somewhat to 93% on room air and was started on supplemental 2L [place] O2.  He was admitted overnight by Dr. Christian Mate who was the surgeon on call.  This morning, he reports he continues to have abdominal pain and hiccups.  He has been afebrile but he did have low grade tachycardia on his recent vital signs and hypertension likely from pain.    Past Medical History: Past Medical History:  Diagnosis Date   Arthritis    Chronic back pain    GERD (gastroesophageal reflux disease)    Kidney disease      Past Surgical History: Past Surgical History:  Procedure Laterality Date   BLADDER REPAIR N/A 08/08/2021   Procedure: BLADDER REPAIR injury;  Surgeon: Olean Ree, MD;  Location: [facility] ORS;  Service: General;  Laterality: N/A;   COLECTOMY WITH COLOSTOMY  CREATION/HARTMANN PROCEDURE N/A 08/08/2021   Procedure: COLECTOMY WITH COLOSTOMY CREATION/HARTMANN PROCEDURE;  Surgeon: Olean Ree, MD;  Location: [facility] ORS;  Service: General;  Laterality: N/A;   COLONOSCOPY WITH PROPOFOL N/A 02/03/2016   Procedure: COLONOSCOPY WITH PROPOFOL;  Surgeon: Hulen Luster, MD;  Location: [facility] ENDOSCOPY;  Service: Gastroenterology;  Laterality: N/A;   ESOPHAGOGASTRODUODENOSCOPY (EGD) WITH PROPOFOL N/A 02/03/2016   Procedure: ESOPHAGOGASTRODUODENOSCOPY (EGD) WITH PROPOFOL;  Surgeon: Hulen Luster, MD;  Location: [facility] ENDOSCOPY;  Service: Gastroenterology;  Laterality: N/A;   KNEE SURGERY     LAPAROTOMY N/A 08/08/2021   Procedure: EXPLORATORY LAPAROTOMY;  Surgeon: Olean Ree, MD;  Location: [facility] ORS;  Service: General;  Laterality: N/A;    Home Medications: Prior to Admission medications   Medication Sig Start Date End Date Taking? Authorizing Provider  amLODipine (NORVASC) 5 MG tablet Take 5 mg by mouth daily.   Yes [provider]  docusate sodium (COLACE) 100 MG capsule Take 100 mg by mouth 2 (two) times daily.   Yes [provider]  gabapentin (NEURONTIN) 800 MG tablet Take 800 mg by mouth 3 (three) times daily.   Yes [provider]  methocarbamol (ROBAXIN) 500 MG tablet Take 500-1,000 mg by mouth every 6 (six) hours as needed for muscle spasms.   Yes [provider]  omeprazole (PRILOSEC) 40 MG capsule Take 40 mg by mouth 2 (two) times daily.   Yes [provider]  ondansetron (ZOFRAN-ODT) 4 MG disintegrating tablet Take 4 mg by mouth 3 (  three) times daily as needed for nausea/vomiting.   Yes [provider]  oxyCODONE (OXY IR/ROXICODONE) 5 MG immediate release tablet Take 1 tablet (5 mg total) by mouth every 6 (six) hours as needed for severe pain or breakthrough pain. 08/18/21  Yes Olean Ree, MD    Allergies: No Known Allergies  Social History:  reports that he has never smoked. He has never used  smokeless tobacco. He reports that he does not currently use alcohol. He reports current drug use. Drug: Marijuana.   Family History: History reviewed. No pertinent family history.  Review of Systems: Review of Systems  Constitutional:  Negative for chills and fever.  HENT:  Negative for hearing loss.   Respiratory:  Negative for shortness of breath.   Cardiovascular:  Negative for chest pain.  Gastrointestinal:  Positive for abdominal pain, nausea and vomiting. Negative for constipation and diarrhea.  Genitourinary:  Negative for dysuria.  Musculoskeletal:  Negative for myalgias.  Skin:  Negative for rash.  Neurological:  Negative for dizziness.  Psychiatric/Behavioral:  Negative for depression.    Physical Exam BP (!) 183/106 (BP Location: Left Arm)   Pulse (!) 103   Temp 99.3 F (37.4 C) (Oral)   Resp (!) 30   Ht 6' (1.829 m)   Wt 86.2 kg   SpO2 99%   BMI 25.77 kg/m  CONSTITUTIONAL: In some pain distress HEENT:  Normocephalic, atraumatic, extraocular motion intact. NECK: Trachea is midline, and there is no jugular venous distension.  RESPIRATORY:  Normal respiratory effort without pathologic use of accessory muscles.  On 2L [place]. CARDIOVASCULAR: Regular rhythm and rate. GI: The abdomen is soft, non-distended, with tenderness to palpation in the RUQ and epigastric areas.  Positive Murphy's sign. Midline incision is well healed, and has a LLQ ostomy with healthy mucosa and stool in bag.   MUSCULOSKELETAL:  Normal muscle strength and tone in all four extremities.  No peripheral edema or cyanosis. SKIN: Skin turgor is normal. There are no pathologic skin lesions.  NEUROLOGIC:  Motor and sensation is grossly normal.  Cranial nerves are grossly intact. PSYCH:  Alert and oriented to person, place and time. Affect is normal.  Laboratory Analysis: Results for orders placed or performed during the hospital encounter of 08/27/21 (from the past 24 hour(s))  Lipase, blood     Status:  None   Collection Time: 08/27/21  9:53 PM  Result Value Ref Range   Lipase 28 11 - 51 U/L  Comprehensive metabolic panel     Status: Abnormal   Collection Time: 08/27/21  9:53 PM  Result Value Ref Range   Sodium 132 (L) 135 - 145 mmol/L   Potassium 3.7 3.5 - 5.1 mmol/L   Chloride 100 98 - 111 mmol/L   CO2 26 22 - 32 mmol/L   Glucose, Bld 186 (H) 70 - 99 mg/dL   BUN 12 6 - 20 mg/dL   Creatinine, Ser 0.67 0.61 - 1.24 mg/dL   Calcium 7.8 (L) 8.9 - 10.3 mg/dL   Total Protein 7.0 6.5 - 8.1 g/dL   Albumin 2.6 (L) 3.5 - 5.0 g/dL   AST 27 15 - 41 U/L   ALT 100 (H) 0 - 44 U/L   Alkaline Phosphatase 175 (H) 38 - 126 U/L   Total Bilirubin 0.7 0.3 - 1.2 mg/dL   GFR, Estimated >60 >60 mL/min   Anion gap 6 5 - 15  CBC     Status: Abnormal   Collection Time: 08/27/21  9:53 PM  Result Value Ref Range   WBC 15.6 (H) 4.0 - 10.5 K/uL   RBC 3.10 (L) 4.22 - 5.81 MIL/uL   Hemoglobin 8.9 (L) 13.0 - 17.0 g/dL   HCT 27.3 (L) 39.0 - 52.0 %   MCV 88.1 80.0 - 100.0 fL   MCH 28.7 26.0 - 34.0 pg   MCHC 32.6 30.0 - 36.0 g/dL   RDW 13.9 11.5 - 15.5 %   Platelets 319 150 - 400 K/uL   nRBC 0.0 0.0 - 0.2 %  Lactic acid, plasma     Status: None   Collection Time: 08/27/21 10:28 PM  Result Value Ref Range   Lactic Acid, Venous 1.2 0.5 - 1.9 mmol/L  Resp Panel by RT-PCR (Flu A&B, Covid) Nasopharyngeal Swab     Status: None   Collection Time: 08/28/21 12:41 AM   Specimen: Nasopharyngeal Swab; Nasopharyngeal(NP) swabs in vial transport medium  Result Value Ref Range   SARS Coronavirus 2 by RT PCR NEGATIVE NEGATIVE   Influenza A by PCR NEGATIVE NEGATIVE   Influenza B by PCR NEGATIVE NEGATIVE  Lactic acid, plasma     Status: Abnormal   Collection Time: 08/28/21  1:03 AM  Result Value Ref Range   Lactic Acid, Venous 2.1 (HH) 0.5 - 1.9 mmol/L  Culture, blood (routine x 2)     Status: None (Preliminary result)   Collection Time: 08/28/21  1:03 AM   Specimen: BLOOD  Result Value Ref Range   Specimen  Description BLOOD LEFT FA    Special Requests      BOTTLES DRAWN AEROBIC AND ANAEROBIC Blood Culture results may not be optimal due to an excessive volume of blood received in culture bottles   Culture      NO GROWTH < 12 HOURS Performed at [facility], [address], [place], [place] [address]    Report Status PENDING   Culture, blood (routine x 2)     Status: None (Preliminary result)   Collection Time: 08/28/21  1:03 AM   Specimen: BLOOD  Result Value Ref Range   Specimen Description BLOOD RIGHT AC    Special Requests      BOTTLES DRAWN AEROBIC AND ANAEROBIC Blood Culture adequate volume   Culture      NO GROWTH < 12 HOURS Performed at [facility], [address] [place], [place] [address]    Report Status PENDING   Urinalysis, Routine w reflex microscopic     Status: Abnormal   Collection Time: 08/28/21  5:56 AM  Result Value Ref Range   Color, Urine YELLOW (A) YELLOW   APPearance CLEAR (A) CLEAR   Specific Gravity, Urine 1.042 (H) 1.005 - 1.030   pH 5.0 5.0 - 8.0   Glucose, UA 50 (A) NEGATIVE mg/dL   Hgb urine dipstick NEGATIVE NEGATIVE   Bilirubin Urine NEGATIVE NEGATIVE   Ketones, ur NEGATIVE NEGATIVE mg/dL   Protein, ur NEGATIVE NEGATIVE mg/dL   Nitrite NEGATIVE NEGATIVE   Leukocytes,Ua NEGATIVE NEGATIVE    Imaging: CT ABDOMEN PELVIS W CONTRAST  Result Date: 08/27/2021 CLINICAL DATA:  Abdominal pain. EXAM: CT ABDOMEN AND PELVIS WITH CONTRAST TECHNIQUE: Multidetector CT imaging of the abdomen and pelvis was performed using the standard protocol following bolus administration of intravenous contrast. CONTRAST:  14m OMNIPAQUE IOHEXOL 300 MG/ML  SOLN COMPARISON:  CT abdomen pelvis dated 08/07/2021. FINDINGS: Lower chest: Patchy area of airspace opacity involving the left lung base most consistent with pneumonia or aspiration. Clinical correlation recommended. No intra-abdominal free air.  No free fluid. Hepatobiliary: The liver is unremarkable. The  gallbladder is mildly distended. No calcified gallstone. There is haziness and edematous appearance of the gallbladder wall with probable trace pericholecystic fluid. Ultrasound is recommended for better evaluation of the gallbladder. Pancreas: Unremarkable. No pancreatic ductal dilatation or surrounding inflammatory changes. Spleen: Normal in size without focal abnormality. Adrenals/Urinary Tract: The adrenal glands unremarkable. There is no hydronephrosis on either side. There is symmetric enhancement and excretion of contrast by both kidneys. The visualized ureters and urinary bladder appear unremarkable. Stomach/Bowel: Postsurgical changes of partial colon resection with a left lower quadrant colostomy. There is moderate stool throughout the colon. There is no bowel obstruction or active inflammation. The appendix is normal. Vascular/Lymphatic: Mild aortoiliac atherosclerotic disease. The IVC is unremarkable. No portal venous gas. There is no adenopathy. The Reproductive: The prostate and seminal vesicles are grossly unremarkable. No pelvic mass. Other: Midline vertical anterior abdominal wall incisional scar. Musculoskeletal: Degenerative changes of the spine. No acute osseous pathology. IMPRESSION: 1. Mildly distended gallbladder with haziness and edematous appearance of the gallbladder wall. No calcified gallstone. Ultrasound is recommended for better evaluation of the gallbladder. 2. Patchy area of airspace opacity involving the left lung base most consistent with pneumonia or aspiration. 3. Postsurgical changes of partial colon resection with a left lower quadrant colostomy. No bowel obstruction. Normal appendix. 4. Aortic Atherosclerosis (ICD10-I70.0). Electronically Signed   By: Anner Crete M.D.   On: 08/27/2021 22:56   DG Chest Portable 1 View  Result Date: 08/27/2021 CLINICAL DATA:  Nausea vomiting EXAM: PORTABLE CHEST 1 VIEW COMPARISON:  04/19/2018 FINDINGS: Ground-glass opacity and  consolidation within the left mid and lower lung. No pleural effusion. Normal cardiac size. No pneumothorax IMPRESSION: Patchy consolidation and ground-glass opacity in the left mid to lower lung suspect for pneumonia. Radiographic follow-up to resolution is recommended Electronically Signed   By: Donavan Foil M.D.   On: 08/27/2021 23:36   US Abdomen Limited RUQ (LIVER/GB)  Result Date: 08/28/2021 CLINICAL DATA:  Right upper quadrant pain, abnormal CT EXAM: ULTRASOUND ABDOMEN LIMITED RIGHT UPPER QUADRANT COMPARISON:  CT abdomen/pelvis dated 08/27/2021 FINDINGS: Gallbladder: Layering gallstones with gallbladder sludge. Gallbladder wall thickening with pericholecystic fluid. Positive sonographic Murphy's sign. Common bile duct: Diameter: 5 mm Liver: At the upper limits of normal for parenchymal echogenicity. No focal hepatic lesion is seen. Portal vein is patent on color Doppler imaging with normal direction of blood flow towards the liver. Other: None. IMPRESSION: Cholelithiasis with findings suspicious for acute cholecystitis, as above. Electronically Signed   By: Julian Hy M.D.   On: 08/28/2021 00:22    Assessment and Plan: This is a 55 y.o. male with acute cholecystitis and left lung pneumonia.  --Discussed with the patient that the typical management for his cholecystitis would be robotic cholecystectomy.  However, in his case, this is complicated by the fact that he's only 3 weeks out from his exploratory laparotomy and Hartmann's, which had a significant amount of purulent peritonitis and inflammation, and he is now bound to have a lot of inflammatory changes and adhesions that would make the surgery more difficult and complex.  He also now has a pneumonia, which could be worsened by general anesthesia and endotracheal intubation.  I think given these issues, it would be best to approach his cholecystitis with a percutaneous cholecystostomy drain placement.  I have discussed with Dr. Anselm Pancoast  with IR and he is in agreeement for perc chole drain placement and will plan for today pending availability.   --  For now, continue NPO, IV fluid hydration, IV Zosyn for his cholecystitis and pneumonia, and po/IV pain medication.  He does have chronic back pain and will also restart his home pain medications. --Will discuss later with him about timing for cholecystectomy and how that relates to his colostomy reversal.  I spent 70 minutes dedicated to the care of this patient on the date of this encounter to include pre-visit review of records, face-to-face time with the patient discussing diagnosis and management, and any post-visit coordination of care.   Melvyn Neth, MD [facility] Pg:  [phone]

## 2021-08-28 NOTE — Consult Note (Signed)
Britt Nurse ostomy follow up Patient receiving care in [facility]. Spouse present at time of my visit. Stoma type/location: LUQ colostomy Stomal assessment/size: assessment deferred; patient soon to go to IR for drain placement. Also, ostomy supplies must be obtained. Peristomal assessment: deferred Treatment options for stomal/peristomal skin: patient does use a barrier ring Output: soft brown in existing 2 piece, 2 & 3/4 inch pouching system  Ostomy pouching: 2pc.  Education provided: Patient explains he does all of his own ostomy care. Enrolled patient in [place] Start Discharge program: Yes; previously.  Patient has not had any problem getting ostomy supplies.  I was able to locate 3 sets of 2 & 1/4 inches pouches and skin barriers, and 5 barrier rings.  I placed these, some tape, and a suture removal kit at the bedside and instructed his primary nurse, his wife and the patient, that if he get transferred or discharged everything in the bag is to go home with him.  Also, I requested the [place] order 5 skin barriers Kellie Simmering #2) and 5 pouches Kellie Simmering [phone]) that are the 2 & 3/4 inches size for placement into his room.  No further actions needed at this time.  [place] nurse will not follow at this time.  Please re-consult the [facility] team if needed.  Val Riles, RN, MSN, CWOCN, CNS-BC, pager [phone]

## 2021-08-28 NOTE — Progress Notes (Signed)
See MEWS flowsheet and MAR; order history can be viewed from 0745 to 0915 as well for additional changes.    08/28/21 0752  Assess: MEWS Score  Temp 99.3 F (37.4 C)  BP (!) 183/106  Pulse Rate (!) 103  Resp (!) 30  Level of Consciousness Alert  SpO2 99 %  O2 Device Nasal Cannula  O2 Flow Rate (L/min) 2 L/min  Assess: MEWS Score  MEWS Temp 0  MEWS Systolic 0  MEWS Pulse 1  MEWS RR 2  MEWS LOC 0  MEWS Score 3  MEWS Score Color Yellow  Assess: if the MEWS score is Yellow or Red  Were vital signs taken at a resting state? Yes  Focused Assessment No change from prior assessment  Does the patient meet 2 or more of the SIRS criteria? Yes  Does the patient have a confirmed or suspected source of infection? Yes  Provider and Rapid Response Notified? No (no rapid response at this time)  MEWS guidelines implemented *See Row Information* Yes  Treat  MEWS Interventions Escalated (See documentation below)  Pain Scale 0-10  Pain Score 10  Pain Type Acute pain  Pain Location Abdomen  Pain Descriptors / Indicators Sharp  Pain Intervention(s) MD notified (Comment);Medication (See eMAR)  Take Vital Signs  Increase Vital Sign Frequency  Yellow: Q 2hr X 2 then Q 4hr X 2, if remains yellow, continue Q 4hrs  Escalate  MEWS: Escalate Yellow: discuss with charge nurse/RN and consider discussing with provider and RRT  Notify: Charge Nurse/RN  Name of Charge Nurse/RN Notified Debi Pinkerton RN  Date Charge Nurse/RN Notified 08/28/21  Time Charge Nurse/RN Notified 7414  Notify: Provider  Provider Name/Title Dr. Christian Mate  Date Provider Notified 08/28/21  Time Provider Notified [phone]  Notification Type Page  Notification Reason Change in status  Provider response See new orders  Date of Provider Response 08/28/21  Time of Provider Response 0756  Notify: Rapid Response  Name of Rapid Response RN Notified Charlie RN  Date Rapid Response Notified 08/28/21  Time Rapid Response Notified 0838   Document  Patient Outcome Stabilized after interventions  Progress note created (see row info) Yes  Assess: SIRS CRITERIA  SIRS Temperature  0  SIRS Pulse 1  SIRS Respirations  1  SIRS WBC 0  SIRS Score Sum  2

## 2021-08-28 NOTE — Consult Note (Signed)
Chief Complaint: Patient was seen in consultation today for acute cholecystitis at the request of Olean Ree, MD  Referring Physician(s): Olean Ree, MD  Supervising Physician: Markus Daft  Patient Status: [facility] - In-pt  History of Present Illness: Gregory Oconnell is a 55 y.o. male with PMHx significant for diverticulitis with perforation s/p recent Ex Lap and Hartmann's procedure 08/08/21. Patient presented to ED 11/9 with complaints of new onset abdominal pain right sided that progressed to diffuse abdominal pain with associated nausea and vomiting. The patient states his last episode of emesis was last evening, he has been NPO since.   CT and US imaging done revealed findings consistent with acute cholecystitis with cholelithiasis, surgical team has evaluated the patient and given recent Ex Lap and now pneumonia this admission request received for IR image guided percutaneous cholecystostomy tube placement.   The patient admits to current abdominal pain, he denies any chest pain, shortness of breath or palpitations. He denies any blood thinner use. The patient denies any history of sleep apnea or chronic oxygen use. He has no known complications to sedation.    Past Medical History:  Diagnosis Date   Arthritis    Chronic back pain    GERD (gastroesophageal reflux disease)    Kidney disease     Past Surgical History:  Procedure Laterality Date   BLADDER REPAIR N/A 08/08/2021   Procedure: BLADDER REPAIR injury;  Surgeon: Olean Ree, MD;  Location: [facility] ORS;  Service: General;  Laterality: N/A;   COLECTOMY WITH COLOSTOMY CREATION/HARTMANN PROCEDURE N/A 08/08/2021   Procedure: COLECTOMY WITH COLOSTOMY CREATION/HARTMANN PROCEDURE;  Surgeon: Olean Ree, MD;  Location: [facility] ORS;  Service: General;  Laterality: N/A;   COLONOSCOPY WITH PROPOFOL N/A 02/03/2016   Procedure: COLONOSCOPY WITH PROPOFOL;  Surgeon: Hulen Luster, MD;  Location: [facility] ENDOSCOPY;  Service:  Gastroenterology;  Laterality: N/A;   ESOPHAGOGASTRODUODENOSCOPY (EGD) WITH PROPOFOL N/A 02/03/2016   Procedure: ESOPHAGOGASTRODUODENOSCOPY (EGD) WITH PROPOFOL;  Surgeon: Hulen Luster, MD;  Location: [facility] ENDOSCOPY;  Service: Gastroenterology;  Laterality: N/A;   KNEE SURGERY     LAPAROTOMY N/A 08/08/2021   Procedure: EXPLORATORY LAPAROTOMY;  Surgeon: Olean Ree, MD;  Location: [facility] ORS;  Service: General;  Laterality: N/A;    Allergies: Patient has no known allergies.  Medications: Prior to Admission medications   Medication Sig Start Date End Date Taking? Authorizing Provider  amLODipine (NORVASC) 5 MG tablet Take 5 mg by mouth daily.   Yes [provider]  docusate sodium (COLACE) 100 MG capsule Take 100 mg by mouth 2 (two) times daily.   Yes [provider]  gabapentin (NEURONTIN) 800 MG tablet Take 800 mg by mouth 3 (three) times daily.   Yes [provider]  methocarbamol (ROBAXIN) 500 MG tablet Take 500-1,000 mg by mouth every 6 (six) hours as needed for muscle spasms.   Yes [provider]  omeprazole (PRILOSEC) 40 MG capsule Take 40 mg by mouth 2 (two) times daily.   Yes [provider]  ondansetron (ZOFRAN-ODT) 4 MG disintegrating tablet Take 4 mg by mouth 3 (three) times daily as needed for nausea/vomiting.   Yes [provider]  oxyCODONE (OXY IR/ROXICODONE) 5 MG immediate release tablet Take 1 tablet (5 mg total) by mouth every 6 (six) hours as needed for severe pain or breakthrough pain. 08/18/21  Yes Olean Ree, MD     History reviewed. No pertinent family history.  Social History   Socioeconomic History   Marital status: Married  Spouse name: Not on file   Number of children: Not on file   Years of education: Not on file   Highest education level: Not on file  Occupational History   Not on file  Tobacco Use   Smoking status: Never   Smokeless tobacco: Never  Substance and Sexual Activity   Alcohol use:  Not Currently    Comment: several beers every other week   Drug use: Yes    Types: Marijuana   Sexual activity: Not on file  Other Topics Concern   Not on file  Social History Narrative   Not on file   Social Determinants of Health   Financial Resource Strain: Not on file  Food Insecurity: Not on file  Transportation Needs: Not on file  Physical Activity: Not on file  Stress: Not on file  Social Connections: Not on file    Review of Systems: A 12 point ROS discussed and pertinent positives are indicated in the HPI above.  All other systems are negative.  Review of Systems  Vital Signs: BP (!) 158/99 (BP Location: Left Arm)   Pulse 83   Temp 98.1 F (36.7 C)   Resp 16   Ht 6' (1.829 m)   Wt 190 lb (86.2 kg)   SpO2 100%   BMI 25.77 kg/m   Physical Exam Constitutional:      Comments: Appears tired  HENT:     Head: Normocephalic and atraumatic.  Cardiovascular:     Rate and Rhythm: Normal rate and regular rhythm.  Pulmonary:     Effort: Pulmonary effort is normal. No respiratory distress.  Abdominal:     General: There is distension.     Tenderness: There is generalized abdominal tenderness and tenderness in the right upper quadrant.  Skin:    General: Skin is warm and dry.  Neurological:     Mental Status: He is alert and oriented to person, place, and time.    Imaging: CT ABDOMEN PELVIS W CONTRAST  Result Date: 08/27/2021 CLINICAL DATA:  Abdominal pain. EXAM: CT ABDOMEN AND PELVIS WITH CONTRAST TECHNIQUE: Multidetector CT imaging of the abdomen and pelvis was performed using the standard protocol following bolus administration of intravenous contrast. CONTRAST:  183mL OMNIPAQUE IOHEXOL 300 MG/ML  SOLN COMPARISON:  CT abdomen pelvis dated 08/07/2021. FINDINGS: Lower chest: Patchy area of airspace opacity involving the left lung base most consistent with pneumonia or aspiration. Clinical correlation recommended. No intra-abdominal free air.  No free fluid.  Hepatobiliary: The liver is unremarkable. The gallbladder is mildly distended. No calcified gallstone. There is haziness and edematous appearance of the gallbladder wall with probable trace pericholecystic fluid. Ultrasound is recommended for better evaluation of the gallbladder. Pancreas: Unremarkable. No pancreatic ductal dilatation or surrounding inflammatory changes. Spleen: Normal in size without focal abnormality. Adrenals/Urinary Tract: The adrenal glands unremarkable. There is no hydronephrosis on either side. There is symmetric enhancement and excretion of contrast by both kidneys. The visualized ureters and urinary bladder appear unremarkable. Stomach/Bowel: Postsurgical changes of partial colon resection with a left lower quadrant colostomy. There is moderate stool throughout the colon. There is no bowel obstruction or active inflammation. The appendix is normal. Vascular/Lymphatic: Mild aortoiliac atherosclerotic disease. The IVC is unremarkable. No portal venous gas. There is no adenopathy. The Reproductive: The prostate and seminal vesicles are grossly unremarkable. No pelvic mass. Other: Midline vertical anterior abdominal wall incisional scar. Musculoskeletal: Degenerative changes of the spine. No acute osseous pathology. IMPRESSION: 1. Mildly distended gallbladder with haziness and  edematous appearance of the gallbladder wall. No calcified gallstone. Ultrasound is recommended for better evaluation of the gallbladder. 2. Patchy area of airspace opacity involving the left lung base most consistent with pneumonia or aspiration. 3. Postsurgical changes of partial colon resection with a left lower quadrant colostomy. No bowel obstruction. Normal appendix. 4. Aortic Atherosclerosis (ICD10-I70.0). Electronically Signed   By: Anner Crete M.D.   On: 08/27/2021 22:56   CT ABDOMEN PELVIS W CONTRAST  Result Date: 08/08/2021 CLINICAL DATA:  Left lower quadrant pain with diarrhea, initial encounter  EXAM: CT ABDOMEN AND PELVIS WITH CONTRAST TECHNIQUE: Multidetector CT imaging of the abdomen and pelvis was performed using the standard protocol following bolus administration of intravenous contrast. CONTRAST:  115mL OMNIPAQUE IOHEXOL 350 MG/ML SOLN COMPARISON:  03/16/2016 FINDINGS: Lower chest: No acute abnormality. Hepatobiliary: Liver is within normal limits. Gallbladder is unremarkable. There is a mild-to-moderate amount of perihepatic fluid identified. Pancreas: Unremarkable. No pancreatic ductal dilatation or surrounding inflammatory changes. Spleen: Normal in size without focal abnormality. Adrenals/Urinary Tract: Adrenal glands are within normal limits. Kidneys demonstrate a normal enhancement pattern. No renal calculi or obstructive changes are noted. Normal excretion is noted on delayed images. The bladder is decompressed. Stomach/Bowel: Colon shows evidence of diverticulosis and Peri colonic inflammatory change consistent with diverticulitis. Associated with the diverticular change are multiple well-circumscribed areas of fluid throughout the abdomen and pelvis. These are consistent with prior perforation and multifocal abscess formation. Adjacent to the rectum in the deep pelvis there is a 3 cm fluid collection identified best seen on image number 92 of series 2. Just posterior to the bladder there is a bilobed collection which measures approximately 6.7 x 2.8 cm in greatest transverse and AP dimensions respectively. In the left hemipelvis adjacent to the sigmoid there is a 2.7 cm fluid collection best seen on image number 81 of series 2. Bilobed collection is noted in the right lower quadrant measuring 4.6 cm in greatest dimension best seen on image number 73 of series 2. Additional 4.4 cm collection is noted along the anterior aspect of the left psoas muscle best seen on image number 65 of series 2 and a 6.5 x 5.3 cm fluid collection just anterior to lower pole of the left kidney best seen on image  number 51 of series 2. A few smaller collections are noted along the course of the sigmoid colon. No definitive free air is seen. The area of perforation is not well delineated on this exam. The more proximal colon is unremarkable. The appendix is well visualized and within normal limits. Small bowel demonstrates some surrounding inflammatory change related to the underlying diverticulitis. Stomach is within normal limits. Vascular/Lymphatic: Aortic atherosclerosis. No enlarged abdominal or pelvic lymph nodes. Reproductive: Prostate is unremarkable. Other: Multiple fluid collections as described above scattered throughout the abdomen and pelvis as well as perihepatic fluid consistent with prior diverticular perforation and multifocal abscess formation. Musculoskeletal: Degenerative changes of lumbar spine are noted. No compression deformities are seen. IMPRESSION: Changes consistent with diverticulitis throughout the sigmoid colon. Multiple associated fluid collections are identified throughout the abdomen and pelvis consistent with multifocal abscess formation. Free fluid is also noted surrounding the liver. The exact site of prior perforation is not well appreciated on this exam. No other focal abnormality is noted. Electronically Signed   By: Inez Catalina M.D.   On: 08/08/2021 00:01   Cystogram  Result Date: 08/21/2021 CLINICAL DATA:  history of bladder injury EXAM: CYSTOGRAM TECHNIQUE: After catheterization of the urinary bladder  following sterile technique the bladder was filled with 250 mL Cysto-Hypaque 30% by drip infusion. Serial spot images were obtained during bladder filling and post draining. FLUOROSCOPY TIME:  Fluoroscopy Time:  1 minute 42 seconds Radiation Exposure Index (if provided by the fluoroscopic device): 36.4 mGy Number of Acquired Spot Images: 33 images.  Multiple cine clips. COMPARISON:  CT abdomen pelvis 08/07/2021 FINDINGS: The bladder is normal in size and contour, without filling  defect or diverticula on early filling or distended views. There is no evidence of extraluminal contrast extravasation. The bladder was drained completely and there was no significant postvoid residual volume. IMPRESSION: No evidence of bladder leak. Electronically Signed   By: Maurine Simmering M.D.   On: 08/21/2021 13:59   DG Chest Portable 1 View  Result Date: 08/27/2021 CLINICAL DATA:  Nausea vomiting EXAM: PORTABLE CHEST 1 VIEW COMPARISON:  04/19/2018 FINDINGS: Ground-glass opacity and consolidation within the left mid and lower lung. No pleural effusion. Normal cardiac size. No pneumothorax IMPRESSION: Patchy consolidation and ground-glass opacity in the left mid to lower lung suspect for pneumonia. Radiographic follow-up to resolution is recommended Electronically Signed   By: Donavan Foil M.D.   On: 08/27/2021 23:36   US Abdomen Limited RUQ (LIVER/GB)  Result Date: 08/28/2021 CLINICAL DATA:  Right upper quadrant pain, abnormal CT EXAM: ULTRASOUND ABDOMEN LIMITED RIGHT UPPER QUADRANT COMPARISON:  CT abdomen/pelvis dated 08/27/2021 FINDINGS: Gallbladder: Layering gallstones with gallbladder sludge. Gallbladder wall thickening with pericholecystic fluid. Positive sonographic Murphy's sign. Common bile duct: Diameter: 5 mm Liver: At the upper limits of normal for parenchymal echogenicity. No focal hepatic lesion is seen. Portal vein is patent on color Doppler imaging with normal direction of blood flow towards the liver. Other: None. IMPRESSION: Cholelithiasis with findings suspicious for acute cholecystitis, as above. Electronically Signed   By: Julian Hy M.D.   On: 08/28/2021 00:22    Labs:  CBC: Recent Labs    08/11/21 0459 08/12/21 0414 08/13/21 0433 08/27/21 2153  WBC 14.2* 14.5* 15.2* 15.6*  HGB 9.4* 9.5* 9.8* 8.9*  HCT 28.2* 28.5* 28.8* 27.3*  PLT 420* 464* 480* 319    COAGS: Recent Labs    08/08/21 0912 08/28/21 1011  INR 1.2 1.1  APTT 28  --     BMP: Recent Labs     08/10/21 0515 08/11/21 0459 08/12/21 0414 08/27/21 2153  NA 133* 135 129* 132*  K 3.8 3.7 3.9 3.7  CL 101 100 101 100  CO2 25 25 21* 26  GLUCOSE 133* 107* 153* 186*  BUN 22* 18 13 12   CALCIUM 7.4* 7.5* 7.5* 7.8*  CREATININE 0.68 0.73 0.75 0.67  GFRNONAA >60 >60 >60 >60    LIVER FUNCTION TESTS: Recent Labs    08/07/21 2018 08/27/21 2153  BILITOT 1.3* 0.7  AST 36 27  ALT 28 100*  ALKPHOS 99 175*  PROT 5.8* 7.0  ALBUMIN 2.3* 2.6*    Assessment and Plan: 55 year old male with PMHx significant for diverticulitis with perforation s/p recent Ex Lap and Hartmann's procedure. Patient presented to ED 11/9 with complaints of new onset abdominal pain right sided that progressed to diffuse pain with associated nausea and vomiting.   CT and US imaging done revealed findings consistent with acute cholecystitis with cholelithiasis, surgical team has evaluated the patient and given recent Ex Lap and now pneumonia this admission request received for IR image guided percutaneous cholecystostomy tube placement.   The patient has been NPO, no blood thinners taken, imaging, labs  and vitals have been reviewed.  Risks and benefits discussed with the patient including, but not limited to bleeding, infection, gallbladder perforation, bile leak, sepsis or even death.  All of the patient's questions were answered, patient is agreeable to proceed. Consent signed and in chart.  Thank you for this interesting consult.  I greatly enjoyed meeting Gregory Oconnell and look forward to participating in their care.  A copy of this report was sent to the requesting provider on this date.  Electronically Signed: Hedy Jacob, PA-C 08/28/2021, 10:49 AM   I spent a total of 20 Minutes in face to face in clinical consultation, greater than 50% of which was counseling/coordinating care for acute cholecystitis.

## 2021-08-28 NOTE — Progress Notes (Signed)
Initial Nutrition Assessment  DOCUMENTATION CODES:   Not applicable  INTERVENTION:   -RD will follow for diet advancement and add supplements as appropriate  NUTRITION DIAGNOSIS:   Inadequate oral intake related to altered GI function as evidenced by NPO status.  GOAL:   Patient will meet greater than or equal to 90% of their needs  MONITOR:   Diet advancement, Labs, Weight trends, Skin, I & O's  REASON FOR ASSESSMENT:   Malnutrition Screening Tool    ASSESSMENT:   Gregory Oconnell is a 55 y.o. male presenting to the ER last night with acute onset of abdominal pain, associated with nausea and vomiting.  The patient reports that yesterday afternoon he started having pain in the right side of abdomen, near periumbilical region.  He was also belching at home and eventually progressed to emesis.  He is recently s/p exploratory laparotomy and Hartmann's procedure for perforated diverticulitis with significant purulent peritonitis.  He had an iatrogenic bladder injury during the surgery which was repaired primarily and with a negative cystogram last week.  Pt admitted with acute cholecystitis and lt lung pneumonia.   11/10- s/p Percutaneous cholecystostomy tube placement  Reviewed I/O's: +535 ml x 24 hours   UOP: 750 ml x 24 hours  Pt unavailable at time of visit. RD unable to obtain further nutrition-related history or complete nutrition-focused physical exam at this time.     Pt currently NPO for drain placement. Per general surgery notes, awaiting timing for cholecystectomy.   Reviewed wt hx; pt has experienced a 20.8% wt loss over the past year, which is significant for time frame.   Highly suspect pt with malnutrition, however, unable to identify at this time. Pt would greatly benefit from addition of oral nutrition supplements.   Medications reviewed and include lactated ringers infusion @125  ml/hr.   Labs reviewed.   Diet Order:   Diet Order             Diet  NPO time specified Except for: Sips with Meds  Diet effective now                   EDUCATION NEEDS:   Not appropriate for education at this time  Skin:  Skin Assessment: Skin Integrity Issues: Skin Integrity Issues:: Incisions Incisions: abdomen  Last BM:  08/28/21 (via colostomy)  Height:   Ht Readings from Last 1 Encounters:  08/27/21 6' (1.829 m)    Weight:   Wt Readings from Last 1 Encounters:  08/27/21 86.2 kg    Ideal Body Weight:  80.9 kg  BMI:  Body mass index is 25.77 kg/m.  Estimated Nutritional Needs:   Kcal:  2400-2600  Protein:  130-145 grams  Fluid:  > 2 L    Loistine Chance, RD, LDN, [place] Registered Dietitian II Certified Diabetes Care and Education Specialist Please refer to [facility] for RD and/or RD on-call/weekend/after hours pager

## 2021-08-29 ENCOUNTER — Inpatient Hospital Stay: Payer: Medicaid Other

## 2021-08-29 DIAGNOSIS — R7989 Other specified abnormal findings of blood chemistry: Secondary | ICD-10-CM

## 2021-08-29 DIAGNOSIS — F331 Major depressive disorder, recurrent, moderate: Secondary | ICD-10-CM | POA: Diagnosis not present

## 2021-08-29 DIAGNOSIS — K810 Acute cholecystitis: Secondary | ICD-10-CM | POA: Diagnosis not present

## 2021-08-29 LAB — LIPASE, BLOOD: Lipase: 31 U/L (ref 11–51)

## 2021-08-29 LAB — CBC WITH DIFFERENTIAL/PLATELET
Abs Immature Granulocytes: 0.05 10*3/uL (ref 0.00–0.07)
Basophils Absolute: 0.1 10*3/uL (ref 0.0–0.1)
Basophils Relative: 1 %
Eosinophils Absolute: 0.1 10*3/uL (ref 0.0–0.5)
Eosinophils Relative: 1 %
HCT: 27.1 % — ABNORMAL LOW (ref 39.0–52.0)
Hemoglobin: 8.6 g/dL — ABNORMAL LOW (ref 13.0–17.0)
Immature Granulocytes: 1 %
Lymphocytes Relative: 24 %
Lymphs Abs: 1.8 10*3/uL (ref 0.7–4.0)
MCH: 27.5 pg (ref 26.0–34.0)
MCHC: 31.7 g/dL (ref 30.0–36.0)
MCV: 86.6 fL (ref 80.0–100.0)
Monocytes Absolute: 0.6 10*3/uL (ref 0.1–1.0)
Monocytes Relative: 7 %
Neutro Abs: 5.2 10*3/uL (ref 1.7–7.7)
Neutrophils Relative %: 66 %
Platelets: 346 10*3/uL (ref 150–400)
RBC: 3.13 MIL/uL — ABNORMAL LOW (ref 4.22–5.81)
RDW: 13.9 % (ref 11.5–15.5)
WBC: 7.7 10*3/uL (ref 4.0–10.5)
nRBC: 0 % (ref 0.0–0.2)

## 2021-08-29 LAB — COMPREHENSIVE METABOLIC PANEL
ALT: 184 U/L — ABNORMAL HIGH (ref 0–44)
AST: 238 U/L — ABNORMAL HIGH (ref 15–41)
Albumin: 2 g/dL — ABNORMAL LOW (ref 3.5–5.0)
Alkaline Phosphatase: 370 U/L — ABNORMAL HIGH (ref 38–126)
Anion gap: 6 (ref 5–15)
BUN: 10 mg/dL (ref 6–20)
CO2: 27 mmol/L (ref 22–32)
Calcium: 8.2 mg/dL — ABNORMAL LOW (ref 8.9–10.3)
Chloride: 103 mmol/L (ref 98–111)
Creatinine, Ser: 0.55 mg/dL — ABNORMAL LOW (ref 0.61–1.24)
GFR, Estimated: >60 mL/min (ref >60)
Glucose, Bld: 113 mg/dL — ABNORMAL HIGH (ref 70–99)
Potassium: 3.7 mmol/L (ref 3.5–5.1)
Sodium: 136 mmol/L (ref 135–145)
Total Bilirubin: 3.3 mg/dL — ABNORMAL HIGH (ref 0.3–1.2)
Total Protein: 5.7 g/dL — ABNORMAL LOW (ref 6.5–8.1)

## 2021-08-29 LAB — MAGNESIUM: Magnesium: 1.9 mg/dL (ref 1.7–2.4)

## 2021-08-29 LAB — GLUCOSE, CAPILLARY: Glucose-Capillary: 96 mg/dL (ref 70–99)

## 2021-08-29 LAB — BILIRUBIN, DIRECT: Bilirubin, Direct: 2 mg/dL — ABNORMAL HIGH (ref 0.0–0.2)

## 2021-08-29 MED ORDER — GADOBUTROL 1 MMOL/ML IV SOLN
7.5000 mL | Freq: Once | INTRAVENOUS | Status: AC | PRN
  Administered 2021-08-29: 13:00:00 7.5 mL via INTRAVENOUS

## 2021-08-29 MED ORDER — QUETIAPINE FUMARATE 25 MG PO TABS
25.0000 mg | ORAL_TABLET | Freq: Every day | ORAL | Status: DC
  Administered 2021-08-29: 22:00:00 25 mg via ORAL
  Filled 2021-08-29: qty 1

## 2021-08-29 MED ORDER — CITALOPRAM HYDROBROMIDE 20 MG PO TABS
10.0000 mg | ORAL_TABLET | Freq: Every day | ORAL | Status: DC
  Administered 2021-08-29 – 2021-08-30 (×2): 10 mg via ORAL
  Filled 2021-08-29 (×2): qty 1

## 2021-08-29 NOTE — Plan of Care (Signed)
Patient orientedx4, VSS. Pain controlled with meds per MAR. Patient very diaphoretic, soaking through bedding, afebrile and w/o complaint other than being cold. Dressing changed to right abd drain, tolerated well. Colostomy intact. Adequate UO overnight. Turns self in bed. 2L [place] placed for comfort while sleeping. IV fluids infusing per MAR. Fall/safety precautions in place, rounding performed, needs/concerns addressed. Patient eager to start eating foods as surgeon allows.   Problem: Education: Goal: Knowledge of General Education information will improve Description: Including pain rating scale, medication(s)/side effects and non-pharmacologic comfort measures Outcome: Progressing   Problem: Health Behavior/Discharge Planning: Goal: Ability to manage health-related needs will improve Outcome: Progressing   Problem: Clinical Measurements: Goal: Ability to maintain clinical measurements within normal limits will improve Outcome: Progressing Goal: Will remain free from infection Outcome: Progressing Goal: Diagnostic test results will improve Outcome: Progressing Goal: Respiratory complications will improve Outcome: Progressing Goal: Cardiovascular complication will be avoided Outcome: Progressing   Problem: Activity: Goal: Risk for activity intolerance will decrease Outcome: Progressing   Problem: Nutrition: Goal: Adequate nutrition will be maintained Outcome: Progressing   Problem: Coping: Goal: Level of anxiety will decrease Outcome: Progressing   Problem: Elimination: Goal: Will not experience complications related to bowel motility Outcome: Progressing Goal: Will not experience complications related to urinary retention Outcome: Progressing   Problem: Pain Managment: Goal: General experience of comfort will improve Outcome: Progressing   Problem: Safety: Goal: Ability to remain free from injury will improve Outcome: Progressing   Problem: Skin Integrity: Goal: Risk  for impaired skin integrity will decrease Outcome: Progressing

## 2021-08-29 NOTE — Progress Notes (Signed)
08/29/2021  Subjective: No acute events overnight.  Patient had percutaneous cholecystostomy drain placed yesterday by Dr. Anselm Pancoast.  Today, the patient reports his pain is much better and he feels hungry.  He is also feeling very overwhelmed as he's been in/out of hospital recently and feels that he's unable to work and is having now financial issues.  His labwork this morning shows a normalized WBC, but now his LFTs are worse with total bili of 3.3, direct of 2.0, AST 238, ALT 184, Alk phos 370.  Vital signs: Temp:  [97.6 F (36.4 C)-98.6 F (37 C)] 97.8 F (36.6 C) (11/11 0556) Pulse Rate:  [71-92] 85 (11/11 0556) Resp:  [15-22] 16 (11/11 0556) BP: (120-167)/(74-112) 123/91 (11/11 0556) SpO2:  [95 %-100 %] 97 % (11/11 0556)   Intake/Output: 11/10 0701 - 11/11 0700 In: 2263.6 [I.V.:2095.5; IV Piggyback:168.1] Out: 550 [Urine:300; Drains:250] Last BM Date: 08/28/21  Physical Exam: Constitutional: No acute distress Pulmonary:  no respiratory distress or use of accessory muscles Cardiac:  regular rhythm and rate. Abdomen:  soft, non-distended, currently much better tenderness in RUQ and epigastric area.  Perc chole drain in place with bilious fluid in bag.  Labs:  Recent Labs    08/27/21 2153 08/29/21 0552  WBC 15.6* 7.7  HGB 8.9* 8.6*  HCT 27.3* 27.1*  PLT 319 346   Recent Labs    08/27/21 2153 08/29/21 0552  NA 132* 136  K 3.7 3.7  CL 100 103  CO2 26 27  GLUCOSE 186* 113*  BUN 12 10  CREATININE 0.67 0.55*  CALCIUM 7.8* 8.2*   Recent Labs    08/28/21 1011  LABPROT 14.5  INR 1.1    Imaging: IR Perc Cholecystostomy  Result Date: 08/28/2021 INDICATION: 54 year old with acute cholecystitis. Patient is not a surgical candidate at this time due to comorbidities including recent abdominal surgery and pneumonia. EXAM: PERCUTANEOUS CHOLECYSTOSTOMY TUBE PLACEMENT WITH ULTRASOUND AND FLUOROSCOPIC GUIDANCE MEDICATIONS: Inpatient receiving IV antibiotics. Moderate sedation  ANESTHESIA/SEDATION: Moderate (conscious) sedation was employed during this procedure. A total of Versed 1.0 mg and Fentanyl 50 mcg was administered intravenously by the radiology nurse. Total intra-service moderate Sedation Time: 10 minutes. The patient's level of consciousness and vital signs were monitored continuously by radiology nursing throughout the procedure under my direct supervision. FLUOROSCOPY TIME:  Fluoroscopy Time: 1 minute, 13.6 mGy CONTRAST:  4 mL Omnipaque [id number] COMPLICATIONS: None immediate. PROCEDURE: Informed written consent was obtained from the patient after a thorough discussion of the procedural risks, benefits and alternatives. All questions were addressed.A timeout was performed prior to the initiation of the procedure. The right side of the abdomen was prepped and draped in sterile fashion. Maximal barrier sterile technique was utilized including caps, mask, sterile gowns, sterile gloves, sterile drape, hand hygiene and skin antiseptic. Ultrasound was used to identify the gallbladder. Skin was anesthetized in the right intercostal region 1% lidocaine. Small incision was made. Using real-time ultrasound guidance, 21 gauge needle was directed into the gallbladder using a transhepatic approach. Needle position was confirmed within the gallbladder. Dark bile was rapidly draining from the 21 gauge needle. A 0.018 wire was advanced into the gallbladder using ultrasound and fluoroscopic guidance. An Accustick dilator set was placed. A J wire was placed and the tract was dilated to accommodate a 10.2 [place] multipurpose drain. Additional dark bile was removed and a sample was collected for culture. Drain was sutured to skin. Drain was attached to a gravity bag. Dressing was placed. Fluoroscopic and ultrasound images  were taken and saved for documentation. FINDINGS: Distended gallbladder with gallbladder wall thickening and/or pericholecystic edema. Needle was successfully placed in the  gallbladder via a transhepatic approach. 10 French drain confirmed within the gallbladder using ultrasound and fluoroscopic guidance. IMPRESSION: Successful percutaneous cholecystostomy tube using ultrasound and fluoroscopic guidance. Electronically Signed   By: Markus Daft M.D.   On: 08/28/2021 13:38    Assessment/Plan: This is a 55 y.o. male with acute cholecystitis s/p percutaneous cholecystostomy drain placement.  --Discussed with the patient about his labwork change with his elevated LFTs.  Have asked Dr. Marius Ditch with GI to consult on him.  Have ordered MRCP at her request as his CBD was not dilated on his admission U/S. --Will keep him NPO for now until MRCP is done. --Continue IV abx, pain medication, DVT prophylaxis.   Melvyn Neth, [facility]
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
## 2021-08-30 DIAGNOSIS — J189 Pneumonia, unspecified organism: Secondary | ICD-10-CM | POA: Diagnosis not present

## 2021-08-30 DIAGNOSIS — K810 Acute cholecystitis: Secondary | ICD-10-CM | POA: Diagnosis not present

## 2021-08-30 LAB — COMPREHENSIVE METABOLIC PANEL
ALT: 148 U/L — ABNORMAL HIGH (ref 0–44)
AST: 126 U/L — ABNORMAL HIGH (ref 15–41)
Albumin: 2 g/dL — ABNORMAL LOW (ref 3.5–5.0)
Alkaline Phosphatase: 322 U/L — ABNORMAL HIGH (ref 38–126)
Anion gap: 6 (ref 5–15)
BUN: 8 mg/dL (ref 6–20)
CO2: 26 mmol/L (ref 22–32)
Calcium: 8.1 mg/dL — ABNORMAL LOW (ref 8.9–10.3)
Chloride: 104 mmol/L (ref 98–111)
Creatinine, Ser: 0.58 mg/dL — ABNORMAL LOW (ref 0.61–1.24)
GFR, Estimated: >60 mL/min (ref >60)
Glucose, Bld: 101 mg/dL — ABNORMAL HIGH (ref 70–99)
Potassium: 3.5 mmol/L (ref 3.5–5.1)
Sodium: 136 mmol/L (ref 135–145)
Total Bilirubin: 1.3 mg/dL — ABNORMAL HIGH (ref 0.3–1.2)
Total Protein: 5.9 g/dL — ABNORMAL LOW (ref 6.5–8.1)

## 2021-08-30 MED ORDER — QUETIAPINE FUMARATE 25 MG PO TABS: 25.0000 mg | ORAL_TABLET | Freq: Every day | ORAL | 2 refills | Status: DC

## 2021-08-30 MED ORDER — CITALOPRAM HYDROBROMIDE 10 MG PO TABS: 10.0000 mg | ORAL_TABLET | Freq: Every day | ORAL | 2 refills | Status: DC

## 2021-08-30 MED ORDER — ACETAMINOPHEN 500 MG PO TABS: 1000.0000 mg | ORAL_TABLET | Freq: Four times a day (QID) | ORAL | Status: AC | PRN

## 2021-08-30 MED ORDER — OXYCODONE HCL 5 MG PO TABS: 5.0000 mg | ORAL_TABLET | Freq: Four times a day (QID) | ORAL | 0 refills | Status: DC | PRN

## 2021-08-30 MED ORDER — IBUPROFEN 200 MG PO TABS: 600.0000 mg | ORAL_TABLET | Freq: Three times a day (TID) | ORAL | Status: DC | PRN

## 2021-08-30 MED ORDER — SODIUM CHLORIDE 0.9 % IJ SOLN: 5.0000 mL | Freq: Every day | INTRAMUSCULAR | 1 refills | Status: AC

## 2021-08-30 MED ORDER — AMOXICILLIN-POT CLAVULANATE 875-125 MG PO TABS: 1.0000 | ORAL_TABLET | Freq: Two times a day (BID) | ORAL | 0 refills | Status: AC
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
## 2021-08-30 NOTE — Discharge Summary (Signed)
Patient ID: Gregory Oconnell MRN: [id number] DOB/AGE: November 12, 1965 55 y.o.  Admit date: 08/27/2021 Discharge date: 08/30/2021   Discharge Diagnoses:  Active Problems:   Cholecystitis   Aspiration pneumonia of left lung due to vomit (HCC)   Elevated LFTs   Major depressive disorder, recurrent episode, moderate (HCC)   Procedures: Percutaneous cholecystostomy drain placement  Hospital Course:  Patient presented on 08/27/21 with acute onset of abdominal pain and was found to have cholecystitis on imaging studies.  Given his recent Hartmann's procedure, it was decided to place a percutaneous cholecystostomy drain, which IR did on 08/28/21.  Post-procedure, his LFTs were found to be elevated, and MRCP was done which did not show any choledocholithiasis.  His WBC normalized on 08/29/21.  On 08/30/21, his LFTs were improving, and clinically he continued to improve, without any significant pain.  His drain was functioning well and he was instructed on how to flush the drain.  Outside of the surgical standpoint, the patient revealed to [place] that he was very overwhelmed by his back to back surgical issues with his perforated diverticulitis just three weeks ago and now his gallbladder, and having to apply for disability and being worried about his finances.  His wife mentioned that he has struggled with depression in the past.  Psychiatry was consulted and he was started on Celexa and Seroquel, and has been given outpatient resources for follow up.  We great appreciate their assistance.    On 08/30/21, his diet was advanced, his pain was well controlled, and was deemed ready for discharge.  On exam, he was in no acute distress with stable normal vital signs.  His abdomen was soft, non-distended, and non-tender.  Drain had bilious fluid in it.  LLQ ostomy had brown stool in bag.  Consults: Interventional Radiology, Psychiatry.  Disposition:  Home, self-care. There are no questions and answers to display.         Discharge Instructions     Call MD for:  difficulty breathing, headache or visual disturbances   Complete by: As directed    Call MD for:  persistant nausea and vomiting   Complete by: As directed    Call MD for:  redness, tenderness, or signs of infection (pain, swelling, redness, odor or green/yellow discharge around incision site)   Complete by: As directed    Call MD for:  severe uncontrolled pain   Complete by: As directed    Call MD for:  temperature >100.4   Complete by: As directed    Change dressing (specify)   Complete by: As directed    Dry gauze dressing around drain insertion site -- change once daily, secure with tape.   Diet general   Complete by: As directed    Continue soft diet through the weekend, and then may resume your regular diet afterwards.   Discharge instructions   Complete by: As directed    1.  Patient may shower, but do not scrub wounds heavily and dab dry only. 2.  Do not submerge the drain in pool/tub 3.  Continue ostomy care as you have been. 4.  Drain care:  Please flush with 5 ml of normal saline once daily.  Can apply dry gauze dressing around the drain insertion site to keep it clean and dry.  Change dressing daily as well. 5.  Please take antibiotic until course completed. 6.  Will give you new prescription for Celexa and Seroquel as started by Psychiatry.  Please contact one of the outpatient psychiatry  centers or your PCP for further refills.   Driving Restrictions   Complete by: As directed    Do not drive while taking narcotics for pain control.  Prior to driving, make sure you are able to rotate right and left to look at blindspots without significant pain or discomfort.   Increase activity slowly   Complete by: As directed    Lifting restrictions   Complete by: As directed    No heavy lifting or pushing of more than 10-15 lbs until 09/05/21.  You may start ramping up activity level after that, and starting 09/19/21, there are  no further activity restrictions.      Allergies as of 08/30/2021   No Known Allergies      Medication List     TAKE these medications    acetaminophen 500 MG tablet Commonly known as: TYLENOL Take 2 tablets (1,000 mg total) by mouth every 6 (six) hours as needed for mild pain.   amLODipine 5 MG tablet Commonly known as: NORVASC Take 5 mg by mouth daily.   amoxicillin-clavulanate 875-125 MG tablet Commonly known as: Augmentin Take 1 tablet by mouth 2 (two) times daily for 10 days.   citalopram 10 MG tablet Commonly known as: CELEXA Take 1 tablet (10 mg total) by mouth daily. Start taking on: August 31, 2021   docusate sodium 100 MG capsule Commonly known as: COLACE Take 100 mg by mouth 2 (two) times daily.   gabapentin 800 MG tablet Commonly known as: NEURONTIN Take 800 mg by mouth 3 (three) times daily.   ibuprofen 200 MG tablet Commonly known as: ADVIL Take 3 tablets (600 mg total) by mouth every 8 (eight) hours as needed for moderate pain.   methocarbamol 500 MG tablet Commonly known as: ROBAXIN Take 500-1,000 mg by mouth every 6 (six) hours as needed for muscle spasms.   omeprazole 40 MG capsule Commonly known as: PRILOSEC Take 40 mg by mouth 2 (two) times daily.   ondansetron 4 MG disintegrating tablet Commonly known as: ZOFRAN-ODT Take 4 mg by mouth 3 (three) times daily as needed for nausea/vomiting.   oxyCODONE 5 MG immediate release tablet Commonly known as: Oxy IR/ROXICODONE Take 1 tablet (5 mg total) by mouth every 6 (six) hours as needed for severe pain. What changed: reasons to take this   QUEtiapine 25 MG tablet Commonly known as: SEROQUEL Take 1 tablet (25 mg total) by mouth at bedtime.   sodium chloride 0.9 % injection 5 mLs by Intracatheter route daily. Flush catheter once daily as instructed.               Discharge Care Instructions  (From admission, onward)           Start     Ordered   08/30/21 0000  Change  dressing (specify)       Comments: Dry gauze dressing around drain insertion site -- change once daily, secure with tape.   08/30/21 1137            Follow-up Information     Trequan Marsolek, Jacqulyn Bath, MD Follow up in 2 week(s).   Specialty: General Surgery Why: Our office will contact you on Monday 09/01/21 to set up appointment in two weeks. Contact information: [address] [place] [address] [phone]

## 2021-09-01 ENCOUNTER — Telehealth: Payer: Self-pay | Admitting: Surgery

## 2021-09-01 NOTE — Telephone Encounter (Signed)
Outreach made to the pt & appointment changed from 09/08/21 to 09/15/21 in [place] w/Dr. Hampton Abbot.  As well, the pt will be dropping off his disability paperwork (in [place]) for completion prior to that appointment.  Thank you

## 2021-09-02 LAB — CULTURE, BLOOD (ROUTINE X 2)
Culture: NO GROWTH
Culture: NO GROWTH
Special Requests: ADEQUATE

## 2021-09-02 LAB — AEROBIC/ANAEROBIC CULTURE W GRAM STAIN (SURGICAL/DEEP WOUND): Culture: NO GROWTH

## 2021-09-08 ENCOUNTER — Encounter: Payer: Medicaid Other | Admitting: Surgery

## 2021-09-09 ENCOUNTER — Telehealth: Payer: Self-pay | Admitting: Surgery

## 2021-09-09 NOTE — Telephone Encounter (Signed)
Patient is calling about his drainage tube and is feeling nausea, and is asking if one of the nurses would give him a call. Please call patient and advise.

## 2021-09-09 NOTE — Telephone Encounter (Signed)
Cholecystostomy tube 08/28/21-complains of Nausea-denies fever-minimal pain/discomfort. Currently taking Augmentin. He has taken on empty stomach-instructed to eat small snack when taking the medication- He has some Zofran and was instructed to take to see if this helps. Normal bowel movements. He will call tomorrow if no better.

## 2021-09-15 ENCOUNTER — Telehealth: Payer: Self-pay

## 2021-09-15 ENCOUNTER — Ambulatory Visit (INDEPENDENT_AMBULATORY_CARE_PROVIDER_SITE_OTHER): Payer: Medicaid Other | Admitting: Surgery

## 2021-09-15 ENCOUNTER — Telehealth: Payer: Self-pay | Admitting: Surgery

## 2021-09-15 ENCOUNTER — Encounter: Payer: Self-pay | Admitting: Surgery

## 2021-09-15 ENCOUNTER — Other Ambulatory Visit: Payer: Self-pay

## 2021-09-15 VITALS — BP 115/75 | HR 91 | Temp 98.7°F | Wt 201.0 lb

## 2021-09-15 DIAGNOSIS — K8000 Calculus of gallbladder with acute cholecystitis without obstruction: Secondary | ICD-10-CM

## 2021-09-15 DIAGNOSIS — K810 Acute cholecystitis: Secondary | ICD-10-CM

## 2021-09-15 NOTE — Telephone Encounter (Signed)
Faxed medical clearance to Dr. Tomasa Hose at [phone].

## 2021-09-15 NOTE — Patient Instructions (Addendum)
A cholangiogram has been scheduled for October 07, 2021 @ 11:15 am at the [facility] in [facility].   If you have any concerns or questions, please feel free to call our office. See follow up appointment below (at [facility] location).  Surgical [facility] Care Surgical drains are used to remove extra fluid that normally builds up in a surgical wound after surgery. A surgical drain helps to heal a surgical wound. Different kinds of surgical drains include: Active drains. These drains use suction to pull drainage away from the surgical wound. Drainage flows through a tube to a container outside of the body. With these drains, you need to keep the bulb or the drainage container flat (compressed) at all times, except while you empty it. Flattening the bulb or container creates suction. Passive drains. These drains allow fluid to drain naturally, by gravity. Drainage flows through a tube to a bandage (dressing) or a container outside of the body. Passive drains do not need to be emptied. A drain is placed during surgery. Right after surgery, drainage is usually bright red and a little thicker than water. The drainage may gradually turn yellow or pink and become thinner. It is likely that your health care provider will remove the drain when the drainage stops or when the amount decreases to 1-2 Tbsp (15-30 mL) during a 24-hour period. Supplies needed: Tape. Germ-free cleaning solution (sterile saline). Cotton swabs. Split gauze drain sponge: 4 x 4 inches (10 x 10 cm). Gauze square: 4 x 4 inches (10 x 10 cm). How to care for your surgical drain Care for your drain as told by your health care provider. This is important to help prevent infection. If your drain is placed at your back, or any other hard-to-reach area, ask another person to assist you in performing the following tasks: General care Keep the skin around the drain dry and covered with a dressing at all times. Check  your drain area every day for signs of infection. Check for: Redness, swelling, or pain. Pus or a bad smell. Cloudy drainage. Tenderness or pressure at the drain exit site. Changing the dressing Follow instructions from your health care provider about how to change your dressing. Change your dressing at least once a day. Change it more often if needed to keep the dressing dry. Make sure you: Gather your supplies. Wash your hands with soap and water before you change your dressing. If soap and water are not available, use hand sanitizer. Remove the old dressing. Avoid using scissors to do that. Wash your hands with soap and water again after removing the old dressing. Use sterile saline to clean your skin around the drain. You may need to use a cotton swab to clean the skin. Place the tube through the slit in a drain sponge. Place the drain sponge so that it covers your wound. Place the gauze square or another drain sponge on top of the drain sponge that is on the wound. Make sure the tube is between those layers. Tape the dressing to your skin. Tape the drainage tube to your skin 1-2 inches (2.5-5 cm) below the place where the tube enters your body. Taping keeps the tube from pulling on any stitches (sutures) that you have. Wash your hands with soap and water. Write down the color of your drainage and how often you change your dressing. How to empty your active drain  Make sure that you have a measuring cup that you can empty your  drainage into. Wash your hands with soap and water. If soap and water are not available, use hand sanitizer. Loosen any pins or clips that hold the tube in place. If your health care provider tells you to strip the tube to prevent clots and tube blockages: Hold the tube at the skin with one hand. Use your other hand to pinch the tubing with your thumb and first finger. Gently move your fingers down the tube while squeezing very lightly. This clears any drainage,  clots, or tissue from the tube. You may need to do this several times each day to keep the tube clear. Do not pull on the tube. Open the bulb cap or the drain plug. Do not touch the inside of the cap or the bottom of the plug. Turn the device upside down and gently squeeze. Empty all of the drainage into the measuring cup. Compress the bulb or the container and replace the cap or the plug. To compress the bulb or the container, squeeze it firmly in the middle while you close the cap or plug the container. Write down the amount of drainage that you have in each 24-hour period. If you have less than 2 Tbsp (30 mL) of drainage during 24 hours, contact your health care provider. Flush the drainage down the toilet. Wash your hands with soap and water. Contact a health care provider if: You have redness, swelling, or pain around your drain area. You have pus or a bad smell coming from your drain area. You have a fever or chills. The skin around your drain is warm to the touch. The amount of drainage that you have is increasing instead of decreasing. You have drainage that is cloudy. There is a sudden stop or a sudden decrease in the amount of drainage that you have. Your drain tube falls out. Your active drain does not stay compressed after you empty it. Summary Surgical drains are used to remove extra fluid that normally builds up in a surgical wound after surgery. Different kinds of surgical drains include active drains and passive drains. Active drains use suction to pull drainage away from the surgical wound, and passive drains allow fluid to drain naturally. It is important to care for your drain to prevent infection. If your drain is placed at your back, or any other hard-to-reach area, ask another person to assist you. Contact your health care provider if you have redness, swelling, or pain around your drain area. This information is not intended to replace advice given to you by your health  care provider. Make sure you discuss any questions you have with your health care provider. Document Revised: 11/09/2018 Document Reviewed: 11/09/2018 Elsevier Patient Education  2022 [employer].

## 2021-09-15 NOTE — Telephone Encounter (Signed)
Cholangiogram scheduled [facility] 10/07/2021 @ 11:15 [facility] entrance-check in at registration desk.

## 2021-09-15 NOTE — Progress Notes (Signed)
09/15/2021  History of Present Illness: Gregory Oconnell is a 55 y.o. male presenting for follow up of acute cholecystitis.  He was admitted to hospital on 08/28/21 with abdominal pain and CT scan and U/S showing acute cholecystitis.  He's recently s/p exploratory laparotomy and Hartmann's procedure for perforated diverticulitis with abscess, so a percutaneous cholecystostomy drain was placed.  He was discharged on 08/30/21.  Patient reports that the drain is working well, but he does feel some discomfort with deep breathing or when the drain pulls/tugs on him.  He flushes it as instructed.  He completed his antibiotic course.  He also recently developed a flare up of gout of the left lower extremity.  Past Medical History: Past Medical History:  Diagnosis Date   Arthritis    Chronic back pain    GERD (gastroesophageal reflux disease)    Kidney disease      Past Surgical History: Past Surgical History:  Procedure Laterality Date   BLADDER REPAIR N/A 08/08/2021   Procedure: BLADDER REPAIR injury;  Surgeon: Olean Ree, MD;  Location: [facility] ORS;  Service: General;  Laterality: N/A;   COLECTOMY WITH COLOSTOMY CREATION/HARTMANN PROCEDURE N/A 08/08/2021   Procedure: COLECTOMY WITH COLOSTOMY CREATION/HARTMANN PROCEDURE;  Surgeon: Olean Ree, MD;  Location: [facility] ORS;  Service: General;  Laterality: N/A;   COLONOSCOPY WITH PROPOFOL N/A 02/03/2016   Procedure: COLONOSCOPY WITH PROPOFOL;  Surgeon: Hulen Luster, MD;  Location: [facility] ENDOSCOPY;  Service: Gastroenterology;  Laterality: N/A;   ESOPHAGOGASTRODUODENOSCOPY (EGD) WITH PROPOFOL N/A 02/03/2016   Procedure: ESOPHAGOGASTRODUODENOSCOPY (EGD) WITH PROPOFOL;  Surgeon: Hulen Luster, MD;  Location: [facility] ENDOSCOPY;  Service: Gastroenterology;  Laterality: N/A;   IR PERC CHOLECYSTOSTOMY  08/28/2021   KNEE SURGERY     LAPAROTOMY N/A 08/08/2021   Procedure: EXPLORATORY LAPAROTOMY;  Surgeon: Olean Ree, MD;  Location: [facility] ORS;  Service: General;   Laterality: N/A;    Home Medications: Prior to Admission medications   Medication Sig Start Date End Date Taking? Authorizing Provider  acetaminophen (TYLENOL) 500 MG tablet Take 2 tablets (1,000 mg total) by mouth every 6 (six) hours as needed for mild pain. 08/30/21   Olean Ree, MD  amLODipine (NORVASC) 5 MG tablet Take 5 mg by mouth daily.    [provider]  citalopram (CELEXA) 10 MG tablet Take 1 tablet (10 mg total) by mouth daily. 08/31/21   Olean Ree, MD  docusate sodium (COLACE) 100 MG capsule Take 100 mg by mouth 2 (two) times daily.    [provider]  gabapentin (NEURONTIN) 800 MG tablet Take 800 mg by mouth 3 (three) times daily.    [provider]  ibuprofen (ADVIL) 200 MG tablet Take 3 tablets (600 mg total) by mouth every 8 (eight) hours as needed for moderate pain. 08/30/21   Olean Ree, MD  methocarbamol (ROBAXIN) 500 MG tablet Take 500-1,000 mg by mouth every 6 (six) hours as needed for muscle spasms.    [provider]  omeprazole (PRILOSEC) 40 MG capsule Take 40 mg by mouth 2 (two) times daily.    [provider]  ondansetron (ZOFRAN-ODT) 4 MG disintegrating tablet Take 4 mg by mouth 3 (three) times daily as needed for nausea/vomiting.    [provider]  oxyCODONE (OXY IR/ROXICODONE) 5 MG immediate release tablet Take 1 tablet (5 mg total) by mouth every 6 (six) hours as needed for severe pain. 08/30/21   Olean Ree, MD  QUEtiapine (SEROQUEL) 25 MG tablet Take 1 tablet (25 mg total)  by mouth at bedtime. 08/30/21   Jalik Gellatly, Jacqulyn Bath, MD  sodium chloride 0.9 % injection 5 mLs by Intracatheter route daily. Flush catheter once daily as instructed. 08/30/21 10/29/21  Olean Ree, MD    Allergies: No Known Allergies  Review of Systems: Review of Systems  Constitutional:  Negative for chills and fever.  HENT:  Negative for hearing loss.   Respiratory:  Negative for shortness of breath.   Cardiovascular:   Negative for chest pain.  Gastrointestinal:  Negative for abdominal pain, nausea and vomiting.  Genitourinary:  Negative for dysuria.  Musculoskeletal:  Negative for myalgias.  Skin:  Negative for rash.  Neurological:  Negative for dizziness.  Psychiatric/Behavioral:  Negative for depression.    Physical Exam BP 115/75   Pulse 91   Temp 98.7 F (37.1 C) (Oral)   Wt 201 lb (91.2 kg)   SpO2 99%   BMI 27.26 kg/m  CONSTITUTIONAL: No acute distress HEENT:  Normocephalic, atraumatic, extraocular motion intact. NECK:  Trachea is midline, no jugular venous distention RESPIRATORY:  Normal respiratory effort without pathologic use of accessory muscles. CARDIOVASCULAR: Regular rhythm and rate. GI: The abdomen is soft, non-distended, non-tender to palpation.  RUQ drain with bilious fluid in bag, without any evidence of skin infection around the drain.  LLQ ostomy healthy with stool in bag.  Midline incision well healed.  MUSCULOSKELETAL:  Patient is wearing a left foot/ankle brace and has some edema of the ankle. NEUROLOGIC:  Motor and sensation is grossly normal.  Cranial nerves are grossly intact. PSYCH:  Alert and oriented to person, place and time. Affect is normal.  Labs/Imaging: CT abdomen/pelvis 08/27/21: IMPRESSION: 1. Mildly distended gallbladder with haziness and edematous appearance of the gallbladder wall. No calcified gallstone. Ultrasound is recommended for better evaluation of the gallbladder. 2. Patchy area of airspace opacity involving the left lung base most consistent with pneumonia or aspiration. 3. Postsurgical changes of partial colon resection with a left lower quadrant colostomy. No bowel obstruction. Normal appendix. 4. Aortic Atherosclerosis (ICD10-I70.0).  U/S RUQ 08/27/21: IMPRESSION: Cholelithiasis with findings suspicious for acute cholecystitis, as above.  Assessment and Plan: This is a 55 y.o. male with acute cholecystitis s/p percutaneous  cholecystostomy drain placement.  --Discussed with the patient that from the ostomy standpoint, we would plan his colostomy reversal in April 2023, which would be about 6 months from his original Hartmann's.  However, I think it's ok to proceed with cholecystectomy sooner, so it's less of a nuisance to deal with the drain or with discomfort, etc.  From the gallbladder standpoint, discussed with him the logic behind waiting for few weeks to have the track mature and doing a cholangiogram through the drain as well to evaluate the gallbladder and cystic duct.  Then, the next step would be to plan for cholecystectomy, which would be around mid January. --Will order cholangiogram for the week of December 19th. --Follow up with me around first week of January 2023 for H&P update.  Will also send for medical clearance. --Will schedule for robotic assisted cholecystectomy on 10/28/21.  Discussed with him the surgery at length, including risks of bleeding, infection, injury to surrounding structures, that this is outpatient surgery, possibility of a temporary drain, post-op recovery and activity restrictions, and he's willing to proceed.  I spent 40 minutes dedicated to the care of this patient on the date of this encounter to include pre-visit review of records, face-to-face time with the patient discussing diagnosis and management, and any post-visit coordination of care.  Melvyn Neth, [facility]

## 2021-09-15 NOTE — Telephone Encounter (Signed)
Outgoing call is made, left message for patient to call.  Please inform patient of the following:  Pre-Admission date/time, COVID Testing date and Surgery date.  Surgery Date: 10/28/21 Preadmission Testing Date: 10/17/21 (phone 8a-1p) Covid Testing Date: Not needed.     Also patient will need to call at [phone], between 1-3:00pm the day before surgery, to find out what time to arrive for surgery.

## 2021-09-16 NOTE — Telephone Encounter (Signed)
Outgoing call is made again, spoke with patient, he is now aware of all dates regarding his surgery and verbalized understanding.

## 2021-09-25 NOTE — Progress Notes (Unsigned)
Medical Clearance received from Dr Phill Mutter office. The patient is cleared at Low risk for surgery.

## 2021-10-07 ENCOUNTER — Ambulatory Visit
Admission: RE | Admit: 2021-10-07 | Discharge: 2021-10-07 | Disposition: A | Discharge status: Home / Self Care | Payer: Medicaid Other | Source: Ambulatory Visit | Attending: Surgery | Admitting: Surgery

## 2021-10-07 ENCOUNTER — Other Ambulatory Visit: Payer: Self-pay

## 2021-10-07 DIAGNOSIS — K8000 Calculus of gallbladder with acute cholecystitis without obstruction: Secondary | ICD-10-CM | POA: Diagnosis present

## 2021-10-07 HISTORY — PX: IR CHOLANGIOGRAM EXISTING TUBE: IMG6040

## 2021-10-07 MED ORDER — IOHEXOL 350 MG/ML SOLN
20.0000 mL | Freq: Once | INTRAVENOUS | Status: AC | PRN
  Administered 2021-10-07: 12:00:00 20 mL
  Filled 2021-10-07: qty 20

## 2021-10-07 NOTE — Progress Notes (Signed)
10/07/21 Cholangiogram images personally viewed.  Contrast flows into common bile duct and duodenum.  Cystic duct and CBD are patent.  Drain is in good position.  Will release results to patient via MyChart.  Patient has follow up with me on 10/22/21 and is scheduled for robotic cholecystectomy on 10/28/21.  Olean Ree, MD

## 2021-10-17 ENCOUNTER — Other Ambulatory Visit: Payer: Self-pay

## 2021-10-17 ENCOUNTER — Encounter
Admission: RE | Admit: 2021-10-17 | Discharge: 2021-10-17 | Disposition: A | Discharge status: Home / Self Care | Payer: Medicaid Other | Source: Ambulatory Visit | Attending: Surgery | Admitting: Surgery

## 2021-10-17 DIAGNOSIS — R7989 Other specified abnormal findings of blood chemistry: Secondary | ICD-10-CM

## 2021-10-17 DIAGNOSIS — I2510 Atherosclerotic heart disease of native coronary artery without angina pectoris: Secondary | ICD-10-CM

## 2021-10-17 HISTORY — DX: Other intervertebral disc degeneration, lumbar region without mention of lumbar back pain or lower extremity pain: M51.369

## 2021-10-17 HISTORY — DX: Other intervertebral disc degeneration, lumbar region: M51.36

## 2021-10-17 HISTORY — DX: Essential (primary) hypertension: I10

## 2021-10-17 NOTE — Patient Instructions (Addendum)
Your procedure is scheduled on:10-28-21 Tuesday Report to the Registration Desk on the 1st floor of the [facility].Then proceed to the 2nd floor Surgery Desk in the [facility] To find out your arrival time, please call [phone] between 1PM - 3PM on:10-27-21 Monday  REMEMBER: Instructions that are not followed completely may result in serious medical risk, up to and including death; or upon the discretion of your surgeon and anesthesiologist your surgery may need to be rescheduled.  Do not eat food after midnight the night before surgery.  No gum chewing, lozengers or hard candies.  You may however, drink CLEAR liquids up to 2 hours before you are scheduled to arrive for your surgery. Do not drink anything within 2 hours of your scheduled arrival time.  Clear liquids include: - water  - apple juice without pulp - gatorade (not RED, PURPLE, OR BLUE) - black coffee or tea (Do NOT add milk or creamers to the coffee or tea) Do NOT drink anything that is not on this list.  TAKE THESE MEDICATIONS THE MORNING OF SURGERY WITH A SIP OF WATER: -allopurinol (ZYLOPRIM) -amLODipine (NORVASC)  -citalopram (CELEXA)  -gabapentin (NEURONTIN) -methocarbamol (ROBAXIN)  -omeprazole (PRILOSEC)   One week prior to surgery: Stop Anti-inflammatories (NSAIDS) such as Advil, Aleve, Ibuprofen, Motrin, Naproxen, Naprosyn and Aspirin based products such as Excedrin, [place] Powder, BC Powder.You may however, continue to take Tylenol if needed for pain up until the day of surgery.  Stop ANY OVER THE COUNTER supplements/vitamins 7 days prior to surgery (Multiple Vitamin )  No Alcohol for 24 hours before or after surgery.  No Smoking including e-cigarettes for 24 hours prior to surgery.  No chewable tobacco products for at least 6 hours prior to surgery.  No nicotine patches on the day of surgery.  Do not use any "recreational" drugs for at least a week prior to your surgery.  Please be advised that the  combination of cocaine and anesthesia may have negative outcomes, up to and including death. If you test positive for cocaine, your surgery will be cancelled.  On the morning of surgery brush your teeth with toothpaste and water, you may rinse your mouth with mouthwash if you wish. Do not swallow any toothpaste or mouthwash.  Use CHG Soap as directed on instruction sheet.  Do not wear jewelry, make-up, hairpins, clips or nail polish.  Do not wear lotions, powders, or perfumes.   Do not shave body from the neck down 48 hours prior to surgery just in case you cut yourself which could leave a site for infection.  Also, freshly shaved skin may become irritated if using the CHG soap.  Contact lenses, hearing aids and dentures may not be worn into surgery.  Do not bring valuables to the hospital. [facility] is not responsible for any missing/lost belongings or valuables.  Notify your doctor if there is any change in your medical condition (cold, fever, infection).  Wear comfortable clothing (specific to your surgery type) to the hospital.  After surgery, you can help prevent lung complications by doing breathing exercises.  Take deep breaths and cough every 1-2 hours. Your doctor may order a device called an Incentive Spirometer to help you take deep breaths. When coughing or sneezing, hold a pillow firmly against your incision with both hands. This is called splinting. Doing this helps protect your incision. It also decreases belly discomfort.  If you are being admitted to the hospital overnight, leave your suitcase in the car. After surgery  it may be brought to your room.  If you are being discharged the day of surgery, you will not be allowed to drive home. You will need a responsible adult (18 years or older) to drive you home and stay with you that night.   If you are taking public transportation, you will need to have a responsible adult (18 years or older) with you. Please  confirm with your physician that it is acceptable to use public transportation.   Please call the [place] Dept. at [phone] if you have any questions about these instructions.  Surgery Visitation Policy:  Patients undergoing a surgery or procedure may have one family member or support person with them as long as that person is not COVID-19 positive or experiencing its symptoms.  That person may remain in the waiting area during the procedure and may rotate out with other people.  Inpatient Visitation:    Visiting hours are 7 a.m. to 8 p.m. Up to two visitors ages 16+ are allowed at one time in a patient room. The visitors may rotate out with other people during the day. Visitors must check out when they leave, or other visitors will not be allowed. One designated support person may remain overnight. The visitor must pass COVID-19 screenings, use hand sanitizer when entering and exiting the patients room and wear a mask at all times, including in the patients room. Patients must also wear a mask when staff or their visitor are in the room. Masking is required regardless of vaccination status.

## 2021-10-22 ENCOUNTER — Other Ambulatory Visit: Payer: Self-pay

## 2021-10-22 ENCOUNTER — Encounter
Admission: RE | Admit: 2021-10-22 | Discharge: 2021-10-22 | Disposition: A | Discharge status: Home / Self Care | Payer: Medicaid Other | Source: Ambulatory Visit | Attending: Surgery | Admitting: Surgery

## 2021-10-22 ENCOUNTER — Ambulatory Visit (INDEPENDENT_AMBULATORY_CARE_PROVIDER_SITE_OTHER): Payer: Medicaid Other | Admitting: Surgery

## 2021-10-22 ENCOUNTER — Encounter: Payer: Self-pay | Admitting: Surgery

## 2021-10-22 VITALS — BP 132/88 | HR 78 | Temp 98.4°F | Ht 72.0 in | Wt 220.0 lb

## 2021-10-22 DIAGNOSIS — Z01812 Encounter for preprocedural laboratory examination: Secondary | ICD-10-CM | POA: Insufficient documentation

## 2021-10-22 DIAGNOSIS — K5720 Diverticulitis of large intestine with perforation and abscess without bleeding: Secondary | ICD-10-CM

## 2021-10-22 DIAGNOSIS — I2510 Atherosclerotic heart disease of native coronary artery without angina pectoris: Secondary | ICD-10-CM | POA: Insufficient documentation

## 2021-10-22 DIAGNOSIS — R7989 Other specified abnormal findings of blood chemistry: Secondary | ICD-10-CM | POA: Diagnosis not present

## 2021-10-22 DIAGNOSIS — K8000 Calculus of gallbladder with acute cholecystitis without obstruction: Secondary | ICD-10-CM | POA: Diagnosis not present

## 2021-10-22 DIAGNOSIS — Z1211 Encounter for screening for malignant neoplasm of colon: Secondary | ICD-10-CM

## 2021-10-22 LAB — COMPREHENSIVE METABOLIC PANEL
ALT: 15 U/L (ref 0–44)
AST: 19 U/L (ref 15–41)
Albumin: 3.6 g/dL (ref 3.5–5.0)
Alkaline Phosphatase: 98 U/L (ref 38–126)
Anion gap: 11 (ref 5–15)
BUN: 12 mg/dL (ref 6–20)
CO2: 25 mmol/L (ref 22–32)
Calcium: 8.8 mg/dL — ABNORMAL LOW (ref 8.9–10.3)
Chloride: 102 mmol/L (ref 98–111)
Creatinine, Ser: 0.83 mg/dL (ref 0.61–1.24)
GFR, Estimated: >60 mL/min (ref >60)
Glucose, Bld: 133 mg/dL — ABNORMAL HIGH (ref 70–99)
Potassium: 3.6 mmol/L (ref 3.5–5.1)
Sodium: 138 mmol/L (ref 135–145)
Total Bilirubin: 0.8 mg/dL (ref 0.3–1.2)
Total Protein: 7.4 g/dL (ref 6.5–8.1)

## 2021-10-22 LAB — CBC
HCT: 38.5 % — ABNORMAL LOW (ref 39.0–52.0)
Hemoglobin: 12 g/dL — ABNORMAL LOW (ref 13.0–17.0)
MCH: 26.3 pg (ref 26.0–34.0)
MCHC: 31.2 g/dL (ref 30.0–36.0)
MCV: 84.4 fL (ref 80.0–100.0)
Platelets: 332 10*3/uL (ref 150–400)
RBC: 4.56 MIL/uL (ref 4.22–5.81)
RDW: 16.2 % — ABNORMAL HIGH (ref 11.5–15.5)
WBC: 11.7 10*3/uL — ABNORMAL HIGH (ref 4.0–10.5)
nRBC: 0 % (ref 0.0–0.2)

## 2021-10-22 NOTE — H&P (View-Only) (Signed)
10/22/2021  History of Present Illness: Gregory Oconnell is a 56 y.o. male presenting for H&P update in preparation for a robotic assisted cholecystectomy on 10/28/2021.  Patient had been in the hospital in November 2022 with acute cholecystitis and underwent a percutaneous cholecystostomy drain placement.  He had a cholangiogram on 10/07/2021 which showed a patent cystic duct and common bile duct.  The patient reports that over the last few days he has been noticing some pain in the right upper quadrant particularly at the ribs he thinks that he aggravated a previous rib injury that he had had many years ago while doing some heavy lifting at home.  He also reports that the drain had 2 days with no drainage and now is draining again.  Denies any nausea or vomiting.  The drain has been having bilious fluid.  Past Medical History: Past Medical History:  Diagnosis Date   Arthritis    Bulging lumbar disc    Chronic back pain    GERD (gastroesophageal reflux disease)    Hypertension    Kidney disease      Past Surgical History: Past Surgical History:  Procedure Laterality Date   BLADDER REPAIR N/A 08/08/2021   Procedure: BLADDER REPAIR injury;  Surgeon: Olean Ree, MD;  Location: [facility] ORS;  Service: General;  Laterality: N/A;   COLECTOMY WITH COLOSTOMY CREATION/HARTMANN PROCEDURE N/A 08/08/2021   Procedure: COLECTOMY WITH COLOSTOMY CREATION/HARTMANN PROCEDURE;  Surgeon: Olean Ree, MD;  Location: [facility] ORS;  Service: General;  Laterality: N/A;   COLONOSCOPY WITH PROPOFOL N/A 02/03/2016   Procedure: COLONOSCOPY WITH PROPOFOL;  Surgeon: Hulen Luster, MD;  Location: [facility] ENDOSCOPY;  Service: Gastroenterology;  Laterality: N/A;   ESOPHAGOGASTRODUODENOSCOPY (EGD) WITH PROPOFOL N/A 02/03/2016   Procedure: ESOPHAGOGASTRODUODENOSCOPY (EGD) WITH PROPOFOL;  Surgeon: Hulen Luster, MD;  Location: [facility] ENDOSCOPY;  Service: Gastroenterology;  Laterality: N/A;   IR CHOLANGIOGRAM EXISTING TUBE  10/07/2021   IR  PERC CHOLECYSTOSTOMY  08/28/2021   KNEE SURGERY     LAPAROTOMY N/A 08/08/2021   Procedure: EXPLORATORY LAPAROTOMY;  Surgeon: Olean Ree, MD;  Location: [facility] ORS;  Service: General;  Laterality: N/A;    Home Medications: Prior to Admission medications   Medication Sig Start Date End Date Taking? Authorizing Provider  acetaminophen (TYLENOL) 500 MG tablet Take 2 tablets (1,000 mg total) by mouth every 6 (six) hours as needed for mild pain. 08/30/21  Yes Makenleigh Crownover, Jacqulyn Bath, MD  allopurinol (ZYLOPRIM) 300 MG tablet Take 300 mg by mouth every morning.   Yes [provider]  amLODipine (NORVASC) 5 MG tablet Take 5 mg by mouth every morning.   Yes [provider]  citalopram (CELEXA) 10 MG tablet Take 1 tablet (10 mg total) by mouth daily. Patient taking differently: Take 10 mg by mouth every morning. 08/31/21  Yes Nichele Slawson, Jacqulyn Bath, MD  docusate sodium (COLACE) 100 MG capsule Take 100 mg by mouth 2 (two) times daily.   Yes [provider]  gabapentin (NEURONTIN) 800 MG tablet Take 800 mg by mouth 2 (two) times daily.   Yes [provider]  ibuprofen (ADVIL) 200 MG tablet Take 3 tablets (600 mg total) by mouth every 8 (eight) hours as needed for moderate pain. 08/30/21  Yes Carlis Burnsworth, Jacqulyn Bath, MD  methocarbamol (ROBAXIN) 500 MG tablet Take 1,000 mg by mouth 3 (three) times daily.   Yes [provider]  Multiple Vitamin (MULTIVITAMIN WITH MINERALS) TABS tablet Take 1 tablet by mouth daily.   Yes [provider]  omeprazole ([place])  40 MG capsule Take 40 mg by mouth 2 (two) times daily.   Yes [provider]  ondansetron (ZOFRAN-ODT) 4 MG disintegrating tablet Take 4 mg by mouth 3 (three) times daily as needed for nausea/vomiting.   Yes [provider]  QUEtiapine (SEROQUEL) 25 MG tablet Take 1 tablet (25 mg total) by mouth at bedtime. 08/30/21  Yes Whitley Patchen, MD  sodium chloride 0.9 % injection 5 mLs by Intracatheter route daily. Flush  catheter once daily as instructed. 08/30/21 10/29/21 Yes Sharetha Newson, Jacqulyn Bath, MD  traZODone (DESYREL) 50 MG tablet Take 100 mg by mouth at bedtime.   Yes [provider]  triamcinolone cream (KENALOG) 0.1 % Apply 1 application topically 2 (two) times daily as needed (rash).   Yes [provider]    Allergies: No Known Allergies  Review of Systems: Review of Systems  Constitutional:  Negative for chills and fever.  Respiratory:  Negative for shortness of breath.   Cardiovascular:  Negative for chest pain.  Gastrointestinal:  Negative for abdominal pain, nausea and vomiting.  Musculoskeletal:  Positive for back pain and myalgias.   Physical Exam BP 132/88    Pulse 78    Temp 98.4 F (36.9 C)    Ht 6' (1.829 m)    Wt 220 lb (99.8 kg)    SpO2 98%    BMI 29.84 kg/m  CONSTITUTIONAL: No acute distress HEENT:  Normocephalic, atraumatic, extraocular motion intact. RESPIRATORY:  Lungs are clear, and breath sounds are equal bilaterally. Normal respiratory effort without pathologic use of accessory muscles. CARDIOVASCULAR: Heart is regular without murmurs, gallops, or rubs. GI: The abdomen is soft, nondistended, nontender to palpation in the abdomen particular in the right upper quadrant.  There are some discomfort at the drain insertion site but otherwise no other issues.  The drain has bilious fluid and was flushed with no difficulty.  MUSCULOSKELETAL: The patient has reproducible and localized pain in the low right anterior chest wall at the ninth rib. NEUROLOGIC:  Motor and sensation is grossly normal.  Cranial nerves are grossly intact. PSYCH:  Alert and oriented to person, place and time. Affect is normal.  Labs/Imaging: Labs from 10/22/2021: Sodium 138, potassium 3.6, chloride 102, CO2 25, BUN 12, creatinine 0.83, total bilirubin 0.8, AST 19, ALT 15, alkaline phosphatase 98, albumin 3.6.  WBC 11.7, hemoglobin 12, hematocrit 38.5, platelets 332.  Assessment and Plan: This is a 56  y.o. male with history of acute cholecystitis status post percutaneous cholecystostomy drain placement.  - Patient scheduled for robotic cholecystectomy with ICG cholangiogram on 10/28/2021.  Reviewed with him the surgery at length again including the risks of bleeding, infection, injury to surrounding structures, that this would be an outpatient procedure, postoperative recovery, postoperative activity restrictions, he is willing to proceed.  I think at this point the pain that he is having is musculoskeletal in nature and not related to the drain or his gallbladder.  Recommended that he do combination of heat and ice in that area to help with the discomfort as well as NSAIDs to help with the inflammation in the area. - All questions have been answered.  I spent 15 minutes dedicated to the care of this patient on the date of this encounter to include pre-visit review of records, face-to-face time with the patient discussing diagnosis and management, and any post-visit coordination of care.   Melvyn Neth, [facility]

## 2021-10-22 NOTE — Patient Instructions (Addendum)
We have sent a referral to [facility] for you to have a colonoscopy in preparation for reversing your colostomy. They will call you to schedule this.   You may use ice to the rib area. 15-20 minutes several times a day. You can try alternating with heat if this helps you.   You have requested to have your gallbladder removed. This will be done on 10/28/21 at [facility] with Dr. Hampton Abbot.  You will most likely be out of work 1-2 weeks for this surgery. You will return after your post-op appointment with a lifting restriction for approximately 4 more weeks.  You will be able to eat anything you would like to following surgery. But, start by eating a bland diet and advance this as tolerated. The Gallbladder diet is below, please go as closely by this diet as possible prior to surgery to avoid any further attacks.  If you have any questions, please call our office.  Laparoscopic Cholecystectomy Laparoscopic cholecystectomy is surgery to remove the gallbladder. The gallbladder is located in the upper right part of the abdomen, behind the liver. It is a storage sac for bile, which is produced in the liver. Bile aids in the digestion and absorption of fats. Cholecystectomy is often done for inflammation of the gallbladder (cholecystitis). This condition is usually caused by a buildup of gallstones (cholelithiasis) in the gallbladder. Gallstones can block the flow of bile, and that can result in inflammation and pain. In severe cases, emergency surgery may be required. If emergency surgery is not required, you will have time to prepare for the procedure. Laparoscopic surgery is an alternative to open surgery. Laparoscopic surgery has a shorter recovery time. Your common bile duct may also need to be examined during the procedure. If stones are found in the common bile duct, they may be removed. LET [facility] CARE PROVIDER KNOW ABOUT: Any allergies you have. All medicines you are  taking, including vitamins, herbs, eye drops, creams, and over-the-counter medicines. Previous problems you or members of your family have had with the use of anesthetics. Any blood disorders you have. Previous surgeries you have had.  Any medical conditions you have. RISKS AND COMPLICATIONS Generally, this is a safe procedure. However, problems may occur, including: Infection. Bleeding. Allergic reactions to medicines. Damage to other structures or organs. A stone remaining in the common bile duct. A bile leak from the cyst duct that is clipped when your gallbladder is removed. The need to convert to open surgery, which requires a larger incision in the abdomen. This may be necessary if your surgeon thinks that it is not safe to continue with a laparoscopic procedure. BEFORE THE PROCEDURE Ask your health care provider about: Changing or stopping your regular medicines. This is especially important if you are taking diabetes medicines or blood thinners. Taking medicines such as aspirin and ibuprofen. These medicines can thin your blood. Do not take these medicines before your procedure if your health care provider instructs you not to. Follow instructions from your health care provider about eating or drinking restrictions. Let your health care provider know if you develop a cold or an infection before surgery. Plan to have someone take you home after the procedure. Ask your health care provider how your surgical site will be marked or identified. You may be given antibiotic medicine to help prevent infection. PROCEDURE To reduce your risk of infection: Your health care team will wash or sanitize their hands. Your skin will be washed with soap. An IV  tube may be inserted into one of your veins. You will be given a medicine to make you fall asleep (general anesthetic). A breathing tube will be placed in your mouth. The surgeon will make several small cuts (incisions) in your abdomen. A  thin, lighted tube (laparoscope) that has a tiny camera on the end will be inserted through one of the small incisions. The camera on the laparoscope will send a picture to a TV screen (monitor) in the operating room. This will give the surgeon a good view inside your abdomen. A gas will be pumped into your abdomen. This will expand your abdomen to give the surgeon more room to perform the surgery. Other tools that are needed for the procedure will be inserted through the other incisions. The gallbladder will be removed through one of the incisions. After your gallbladder has been removed, the incisions will be closed with stitches (sutures), staples, or skin glue. Your incisions may be covered with a bandage (dressing). The procedure may vary among health care providers and hospitals. AFTER THE PROCEDURE Your blood pressure, heart rate, breathing rate, and blood oxygen level will be monitored often until the medicines you were given have worn off. You will be given medicines as needed to control your pain.   This information is not intended to replace advice given to you by your health care provider. Make sure you discuss any questions you have with your health care provider.   Document Released: 10/05/2005 Document Revised: 06/26/2015 Document Reviewed: 05/17/2013 Elsevier Interactive Patient Education 2016 [place] Diet for Gallbladder Conditions A low-fat diet can be helpful if you have pancreatitis or a gallbladder condition. With these conditions, your pancreas and gallbladder have trouble digesting fats. A healthy eating plan with less fat will help rest your pancreas and gallbladder and reduce your symptoms. WHAT DO I NEED TO KNOW ABOUT THIS DIET? Eat a low-fat diet. Reduce your fat intake to less than 20-30% of your total daily calories. This is less than 50-60 g of fat per day. Remember that you need some fat in your diet. Ask your dietician what your daily goal should  be. Choose nonfat and low-fat healthy foods. Look for the words "nonfat," "low fat," or "fat free." As a guide, look on the label and choose foods with less than 3 g of fat per serving. Eat only one serving. Avoid alcohol. Do not smoke. If you need help quitting, talk with your health care provider. Eat small frequent meals instead of three large heavy meals. WHAT FOODS CAN I EAT? Grains Include healthy grains and starches such as potatoes, wheat bread, fiber-rich cereal, and brown rice. Choose whole grain options whenever possible. In adults, whole grains should account for 45-65% of your daily calories.  Fruits and Vegetables Eat plenty of fruits and vegetables. Fresh fruits and vegetables add fiber to your diet. Meats and Other Protein Sources Eat lean meat such as chicken and pork. Trim any fat off of meat before cooking it. Eggs, fish, and beans are other sources of protein. In adults, these foods should account for 10-35% of your daily calories. Dairy Choose low-fat milk and dairy options. Dairy includes fat and protein, as well as calcium.  Fats and Oils Limit high-fat foods such as fried foods, sweets, baked goods, sugary drinks.  Other Creamy sauces and condiments, such as mayonnaise, can add extra fat. Think about whether or not you need to use them, or use smaller amounts or low fat options.  WHAT FOODS ARE NOT RECOMMENDED? High fat foods, such as: [employer]. Ice cream. [place] toast. Sweet rolls. Pizza. Cheese bread. Foods covered with batter, butter, creamy sauces, or cheese. Fried foods. Sugary drinks and desserts. Foods that cause gas or bloating   This information is not intended to replace advice given to you by your health care provider. Make sure you discuss any questions you have with your health care provider.   Document Released: 10/10/2013 Document Reviewed: 10/10/2013 Elsevier Interactive Patient Education [employer].

## 2021-10-22 NOTE — Progress Notes (Signed)
10/22/2021  History of Present Illness: Gregory Oconnell is a 56 y.o. male presenting for H&P update in preparation for a robotic assisted cholecystectomy on 10/28/2021.  Patient had been in the hospital in November 2022 with acute cholecystitis and underwent a percutaneous cholecystostomy drain placement.  He had a cholangiogram on 10/07/2021 which showed a patent cystic duct and common bile duct.  The patient reports that over the last few days he has been noticing some pain in the right upper quadrant particularly at the ribs he thinks that he aggravated a previous rib injury that he had had many years ago while doing some heavy lifting at home.  He also reports that the drain had 2 days with no drainage and now is draining again.  Denies any nausea or vomiting.  The drain has been having bilious fluid.  Past Medical History: Past Medical History:  Diagnosis Date   Arthritis    Bulging lumbar disc    Chronic back pain    GERD (gastroesophageal reflux disease)    Hypertension    Kidney disease      Past Surgical History: Past Surgical History:  Procedure Laterality Date   BLADDER REPAIR N/A 08/08/2021   Procedure: BLADDER REPAIR injury;  Surgeon: Olean Ree, MD;  Location: [facility] ORS;  Service: General;  Laterality: N/A;   COLECTOMY WITH COLOSTOMY CREATION/HARTMANN PROCEDURE N/A 08/08/2021   Procedure: COLECTOMY WITH COLOSTOMY CREATION/HARTMANN PROCEDURE;  Surgeon: Olean Ree, MD;  Location: [facility] ORS;  Service: General;  Laterality: N/A;   COLONOSCOPY WITH PROPOFOL N/A 02/03/2016   Procedure: COLONOSCOPY WITH PROPOFOL;  Surgeon: Hulen Luster, MD;  Location: [facility] ENDOSCOPY;  Service: Gastroenterology;  Laterality: N/A;   ESOPHAGOGASTRODUODENOSCOPY (EGD) WITH PROPOFOL N/A 02/03/2016   Procedure: ESOPHAGOGASTRODUODENOSCOPY (EGD) WITH PROPOFOL;  Surgeon: Hulen Luster, MD;  Location: [facility] ENDOSCOPY;  Service: Gastroenterology;  Laterality: N/A;   IR CHOLANGIOGRAM EXISTING TUBE  10/07/2021   IR  PERC CHOLECYSTOSTOMY  08/28/2021   KNEE SURGERY     LAPAROTOMY N/A 08/08/2021   Procedure: EXPLORATORY LAPAROTOMY;  Surgeon: Olean Ree, MD;  Location: [facility] ORS;  Service: General;  Laterality: N/A;    Home Medications: Prior to Admission medications   Medication Sig Start Date End Date Taking? Authorizing Provider  acetaminophen (TYLENOL) 500 MG tablet Take 2 tablets (1,000 mg total) by mouth every 6 (six) hours as needed for mild pain. 08/30/21  Yes Dabney Dever, Jacqulyn Bath, MD  allopurinol (ZYLOPRIM) 300 MG tablet Take 300 mg by mouth every morning.   Yes [provider]  amLODipine (NORVASC) 5 MG tablet Take 5 mg by mouth every morning.   Yes [provider]  citalopram (CELEXA) 10 MG tablet Take 1 tablet (10 mg total) by mouth daily. Patient taking differently: Take 10 mg by mouth every morning. 08/31/21  Yes Sanjit Mcmichael, Jacqulyn Bath, MD  docusate sodium (COLACE) 100 MG capsule Take 100 mg by mouth 2 (two) times daily.   Yes [provider]  gabapentin (NEURONTIN) 800 MG tablet Take 800 mg by mouth 2 (two) times daily.   Yes [provider]  ibuprofen (ADVIL) 200 MG tablet Take 3 tablets (600 mg total) by mouth every 8 (eight) hours as needed for moderate pain. 08/30/21  Yes Carron Jaggi, Jacqulyn Bath, MD  methocarbamol (ROBAXIN) 500 MG tablet Take 1,000 mg by mouth 3 (three) times daily.   Yes [provider]  Multiple Vitamin (MULTIVITAMIN WITH MINERALS) TABS tablet Take 1 tablet by mouth daily.   Yes [provider]  omeprazole (Tate)  40 MG capsule Take 40 mg by mouth 2 (two) times daily.   Yes [provider]  ondansetron (ZOFRAN-ODT) 4 MG disintegrating tablet Take 4 mg by mouth 3 (three) times daily as needed for nausea/vomiting.   Yes [provider]  QUEtiapine (SEROQUEL) 25 MG tablet Take 1 tablet (25 mg total) by mouth at bedtime. 08/30/21  Yes Yaretsi Humphres, MD  sodium chloride 0.9 % injection 5 mLs by Intracatheter route daily. Flush  catheter once daily as instructed. 08/30/21 10/29/21 Yes Miosha Behe, Jacqulyn Bath, MD  traZODone (DESYREL) 50 MG tablet Take 100 mg by mouth at bedtime.   Yes [provider]  triamcinolone cream (KENALOG) 0.1 % Apply 1 application topically 2 (two) times daily as needed (rash).   Yes [provider]    Allergies: No Known Allergies  Review of Systems: Review of Systems  Constitutional:  Negative for chills and fever.  Respiratory:  Negative for shortness of breath.   Cardiovascular:  Negative for chest pain.  Gastrointestinal:  Negative for abdominal pain, nausea and vomiting.  Musculoskeletal:  Positive for back pain and myalgias.   Physical Exam BP 132/88    Pulse 78    Temp 98.4 F (36.9 C)    Ht 6' (1.829 m)    Wt 220 lb (99.8 kg)    SpO2 98%    BMI 29.84 kg/m  CONSTITUTIONAL: No acute distress HEENT:  Normocephalic, atraumatic, extraocular motion intact. RESPIRATORY:  Lungs are clear, and breath sounds are equal bilaterally. Normal respiratory effort without pathologic use of accessory muscles. CARDIOVASCULAR: Heart is regular without murmurs, gallops, or rubs. GI: The abdomen is soft, nondistended, nontender to palpation in the abdomen particular in the right upper quadrant.  There are some discomfort at the drain insertion site but otherwise no other issues.  The drain has bilious fluid and was flushed with no difficulty.  MUSCULOSKELETAL: The patient has reproducible and localized pain in the low right anterior chest wall at the ninth rib. NEUROLOGIC:  Motor and sensation is grossly normal.  Cranial nerves are grossly intact. PSYCH:  Alert and oriented to person, place and time. Affect is normal.  Labs/Imaging: Labs from 10/22/2021: Sodium 138, potassium 3.6, chloride 102, CO2 25, BUN 12, creatinine 0.83, total bilirubin 0.8, AST 19, ALT 15, alkaline phosphatase 98, albumin 3.6.  WBC 11.7, hemoglobin 12, hematocrit 38.5, platelets 332.  Assessment and Plan: This is a 56  y.o. male with history of acute cholecystitis status post percutaneous cholecystostomy drain placement.  - Patient scheduled for robotic cholecystectomy with ICG cholangiogram on 10/28/2021.  Reviewed with him the surgery at length again including the risks of bleeding, infection, injury to surrounding structures, that this would be an outpatient procedure, postoperative recovery, postoperative activity restrictions, he is willing to proceed.  I think at this point the pain that he is having is musculoskeletal in nature and not related to the drain or his gallbladder.  Recommended that he do combination of heat and ice in that area to help with the discomfort as well as NSAIDs to help with the inflammation in the area. - All questions have been answered.  I spent 15 minutes dedicated to the care of this patient on the date of this encounter to include pre-visit review of records, face-to-face time with the patient discussing diagnosis and management, and any post-visit coordination of care.   Melvyn Neth, [facility]

## 2021-10-23 ENCOUNTER — Other Ambulatory Visit: Payer: Self-pay

## 2021-10-23 ENCOUNTER — Telehealth: Payer: Self-pay

## 2021-10-23 DIAGNOSIS — Z1211 Encounter for screening for malignant neoplasm of colon: Secondary | ICD-10-CM

## 2021-10-23 MED ORDER — PEG 3350-KCL-NA BICARB-NACL 420 G PO SOLR: 4000.0000 mL | Freq: Once | ORAL | 0 refills | Status: AC

## 2021-10-23 NOTE — Progress Notes (Signed)
Gastroenterology Pre-Procedure Review  Request Date: 11/11/2021 Requesting Physician: Dr. Vicente Males  PATIENT REVIEW QUESTIONS: The patient responded to the following health history questions as indicated:    1. Are you having any GI issues? no 2. Do you have a personal history of Polyps? no 3. Do you have a family history of Colon Cancer or Polyps? no 4. Diabetes Mellitus? no 5. Joint replacements in the past 12 months?no 6. Major health problems in the past 3 months?colon took out patient has colostomy bag 7. Any artificial heart valves, MVP, or defibrillator?no    MEDICATIONS & ALLERGIES:    Patient reports the following regarding taking any anticoagulation/antiplatelet therapy:   Plavix, Coumadin, Eliquis, Xarelto, Lovenox, Pradaxa, Brilinta, or Effient? no Aspirin? no  Patient confirms/reports the following medications:  Current Outpatient Medications  Medication Sig Dispense Refill   acetaminophen (TYLENOL) 500 MG tablet Take 2 tablets (1,000 mg total) by mouth every 6 (six) hours as needed for mild pain.     allopurinol (ZYLOPRIM) 300 MG tablet Take 300 mg by mouth every morning.     amLODipine (NORVASC) 5 MG tablet Take 5 mg by mouth every morning.     citalopram (CELEXA) 10 MG tablet Take 1 tablet (10 mg total) by mouth daily. (Patient taking differently: Take 10 mg by mouth every morning.) 30 tablet 2   docusate sodium (COLACE) 100 MG capsule Take 100 mg by mouth 2 (two) times daily.     gabapentin (NEURONTIN) 800 MG tablet Take 800 mg by mouth 2 (two) times daily.     ibuprofen (ADVIL) 200 MG tablet Take 3 tablets (600 mg total) by mouth every 8 (eight) hours as needed for moderate pain.     methocarbamol (ROBAXIN) 500 MG tablet Take 1,000 mg by mouth 3 (three) times daily.     Multiple Vitamin (MULTIVITAMIN WITH MINERALS) TABS tablet Take 1 tablet by mouth daily.     omeprazole (PRILOSEC) 40 MG capsule Take 40 mg by mouth 2 (two) times daily.     ondansetron (ZOFRAN-ODT) 4 MG  disintegrating tablet Take 4 mg by mouth 3 (three) times daily as needed for nausea/vomiting.     QUEtiapine (SEROQUEL) 25 MG tablet Take 1 tablet (25 mg total) by mouth at bedtime. 30 tablet 2   sodium chloride 0.9 % injection 5 mLs by Intracatheter route daily. Flush catheter once daily as instructed. 150 mL 1   traZODone (DESYREL) 50 MG tablet Take 100 mg by mouth at bedtime.     triamcinolone cream (KENALOG) 0.1 % Apply 1 application topically 2 (two) times daily as needed (rash).     No current facility-administered medications for this visit.    Patient confirms/reports the following allergies:  No Known Allergies  No orders of the defined types were placed in this encounter.   AUTHORIZATION INFORMATION Primary Insurance: 1D#: Group #:  Secondary Insurance: 1D#: Group #:  SCHEDULE INFORMATION: Date: 11/11/2021 Time: Location: [facility]

## 2021-10-23 NOTE — Telephone Encounter (Signed)
Scheduled for 11/11/2021

## 2021-10-28 ENCOUNTER — Ambulatory Visit: Payer: Medicaid Other | Admitting: Anesthesiology

## 2021-10-28 ENCOUNTER — Ambulatory Visit: Payer: Medicaid Other | Admitting: Urgent Care

## 2021-10-28 ENCOUNTER — Encounter: Payer: Self-pay | Admitting: Surgery

## 2021-10-28 ENCOUNTER — Ambulatory Visit
Admission: RE | Admit: 2021-10-28 | Discharge: 2021-10-28 | Disposition: A | Discharge status: Home / Self Care | Payer: Medicaid Other | Source: Ambulatory Visit | Attending: Surgery | Admitting: Surgery

## 2021-10-28 ENCOUNTER — Other Ambulatory Visit: Payer: Self-pay

## 2021-10-28 ENCOUNTER — Encounter
Admission: RE | Disposition: A | Discharge status: Home / Self Care | Payer: Self-pay | Source: Ambulatory Visit | Attending: Surgery

## 2021-10-28 DIAGNOSIS — K8010 Calculus of gallbladder with chronic cholecystitis without obstruction: Secondary | ICD-10-CM | POA: Insufficient documentation

## 2021-10-28 DIAGNOSIS — Z20822 Contact with and (suspected) exposure to covid-19: Secondary | ICD-10-CM | POA: Diagnosis not present

## 2021-10-28 DIAGNOSIS — K8000 Calculus of gallbladder with acute cholecystitis without obstruction: Secondary | ICD-10-CM

## 2021-10-28 DIAGNOSIS — I10 Essential (primary) hypertension: Secondary | ICD-10-CM | POA: Insufficient documentation

## 2021-10-28 DIAGNOSIS — K8012 Calculus of gallbladder with acute and chronic cholecystitis without obstruction: Secondary | ICD-10-CM | POA: Diagnosis not present

## 2021-10-28 DIAGNOSIS — K219 Gastro-esophageal reflux disease without esophagitis: Secondary | ICD-10-CM | POA: Insufficient documentation

## 2021-10-28 DIAGNOSIS — K810 Acute cholecystitis: Secondary | ICD-10-CM | POA: Diagnosis present

## 2021-10-28 HISTORY — PX: CHOLECYSTECTOMY: SHX55

## 2021-10-28 LAB — RESP PANEL BY RT-PCR (FLU A&B, COVID) ARPGX2
Influenza A by PCR: NEGATIVE
Influenza B by PCR: NEGATIVE
SARS Coronavirus 2 by RT PCR: NEGATIVE

## 2021-10-28 SURGERY — CHOLECYSTECTOMY, ROBOT-ASSISTED, LAPAROSCOPIC
Anesthesia: General

## 2021-10-28 MED ORDER — PROPOFOL 10 MG/ML IV BOLUS
INTRAVENOUS | Status: AC
  Filled 2021-10-28: qty 40

## 2021-10-28 MED ORDER — GABAPENTIN 300 MG PO CAPS: 300.0000 mg | ORAL_CAPSULE | ORAL | Status: AC

## 2021-10-28 MED ORDER — KETAMINE HCL 10 MG/ML IJ SOLN
INTRAMUSCULAR | Status: DC | PRN
Start: 2021-10-28 — End: 2021-10-28
  Administered 2021-10-28: 20 mg via INTRAVENOUS
  Administered 2021-10-28: 30 mg via INTRAVENOUS

## 2021-10-28 MED ORDER — LACTATED RINGERS IV SOLN: INTRAVENOUS | Status: DC

## 2021-10-28 MED ORDER — OXYCODONE HCL 5 MG PO TABS: 5.0000 mg | ORAL_TABLET | ORAL | 0 refills | Status: DC | PRN

## 2021-10-28 MED ORDER — CEFAZOLIN SODIUM-DEXTROSE 2-4 GM/100ML-% IV SOLN
INTRAVENOUS | Status: AC
  Filled 2021-10-28: qty 100

## 2021-10-28 MED ORDER — DEXMEDETOMIDINE HCL IN NACL 200 MCG/50ML IV SOLN
INTRAVENOUS | Status: DC | PRN
  Administered 2021-10-28 (×4): 10 ug via INTRAVENOUS

## 2021-10-28 MED ORDER — DEXAMETHASONE SODIUM PHOSPHATE 10 MG/ML IJ SOLN
INTRAMUSCULAR | Status: DC | PRN
  Administered 2021-10-28: 10 mg via INTRAVENOUS

## 2021-10-28 MED ORDER — PROPOFOL 10 MG/ML IV BOLUS
INTRAVENOUS | Status: DC | PRN
  Administered 2021-10-28: 200 mg via INTRAVENOUS
  Administered 2021-10-28: 70 mg via INTRAVENOUS
  Administered 2021-10-28: 50 mg via INTRAVENOUS
  Administered 2021-10-28: 20 mg via INTRAVENOUS

## 2021-10-28 MED ORDER — HYDROMORPHONE HCL 1 MG/ML IJ SOLN
INTRAMUSCULAR | Status: DC | PRN
  Administered 2021-10-28 (×2): .5 mg via INTRAVENOUS

## 2021-10-28 MED ORDER — INDOCYANINE GREEN 25 MG IV SOLR
2.5000 mg | INTRAVENOUS | Status: AC
  Administered 2021-10-28: 2.5 mg via INTRAVENOUS
  Filled 2021-10-28: qty 1

## 2021-10-28 MED ORDER — CEFAZOLIN SODIUM-DEXTROSE 2-4 GM/100ML-% IV SOLN
2.0000 g | INTRAVENOUS | Status: AC
  Administered 2021-10-28: 2 g via INTRAVENOUS

## 2021-10-28 MED ORDER — CHLORHEXIDINE GLUCONATE CLOTH 2 % EX PADS: 6.0000 | MEDICATED_PAD | Freq: Once | CUTANEOUS | Status: DC

## 2021-10-28 MED ORDER — CHLORHEXIDINE GLUCONATE 0.12 % MT SOLN
OROMUCOSAL | Status: AC
  Administered 2021-10-28: 15 mL via OROMUCOSAL
  Filled 2021-10-28: qty 15

## 2021-10-28 MED ORDER — ROCURONIUM BROMIDE 10 MG/ML (PF) SYRINGE
PREFILLED_SYRINGE | INTRAVENOUS | Status: AC
  Filled 2021-10-28: qty 10

## 2021-10-28 MED ORDER — FENTANYL CITRATE (PF) 100 MCG/2ML IJ SOLN
25.0000 ug | INTRAMUSCULAR | Status: DC | PRN
  Administered 2021-10-28 (×2): 25 ug via INTRAVENOUS

## 2021-10-28 MED ORDER — MIDAZOLAM HCL 2 MG/2ML IJ SOLN
INTRAMUSCULAR | Status: AC
  Filled 2021-10-28: qty 2

## 2021-10-28 MED ORDER — SODIUM CHLORIDE 0.9 % IR SOLN
Status: DC | PRN
  Administered 2021-10-28: 200 mL

## 2021-10-28 MED ORDER — MIDAZOLAM HCL 2 MG/2ML IJ SOLN
INTRAMUSCULAR | Status: DC | PRN
  Administered 2021-10-28: 2 mg via INTRAVENOUS

## 2021-10-28 MED ORDER — BUPIVACAINE-EPINEPHRINE (PF) 0.25% -1:200000 IJ SOLN
INTRAMUSCULAR | Status: AC
  Filled 2021-10-28: qty 30

## 2021-10-28 MED ORDER — CHLORHEXIDINE GLUCONATE 0.12 % MT SOLN: 15.0000 mL | Freq: Once | OROMUCOSAL | Status: AC

## 2021-10-28 MED ORDER — OXYCODONE HCL 5 MG PO TABS
ORAL_TABLET | ORAL | Status: AC
  Administered 2021-10-28: 5 mg via ORAL
  Filled 2021-10-28: qty 1

## 2021-10-28 MED ORDER — PHENYLEPHRINE HCL (PRESSORS) 10 MG/ML IV SOLN
INTRAVENOUS | Status: AC
  Filled 2021-10-28: qty 1

## 2021-10-28 MED ORDER — GABAPENTIN 300 MG PO CAPS
ORAL_CAPSULE | ORAL | Status: AC
  Administered 2021-10-28: 300 mg via ORAL
  Filled 2021-10-28: qty 1

## 2021-10-28 MED ORDER — LIDOCAINE HCL (PF) 2 % IJ SOLN
INTRAMUSCULAR | Status: AC
  Filled 2021-10-28: qty 5

## 2021-10-28 MED ORDER — ORAL CARE MOUTH RINSE: 15.0000 mL | Freq: Once | OROMUCOSAL | Status: AC

## 2021-10-28 MED ORDER — ONDANSETRON HCL 4 MG/2ML IJ SOLN
4.0000 mg | Freq: Once | INTRAMUSCULAR | Status: AC | PRN
  Administered 2021-10-28: 4 mg via INTRAVENOUS

## 2021-10-28 MED ORDER — HYDROMORPHONE HCL 1 MG/ML IJ SOLN
INTRAMUSCULAR | Status: AC
  Filled 2021-10-28: qty 1

## 2021-10-28 MED ORDER — PHENYLEPHRINE HCL-NACL 20-0.9 MG/250ML-% IV SOLN
INTRAVENOUS | Status: AC
  Filled 2021-10-28: qty 250

## 2021-10-28 MED ORDER — ACETAMINOPHEN 500 MG PO TABS: 1000.0000 mg | ORAL_TABLET | ORAL | Status: AC

## 2021-10-28 MED ORDER — FENTANYL CITRATE (PF) 100 MCG/2ML IJ SOLN
INTRAMUSCULAR | Status: AC
  Filled 2021-10-28: qty 2

## 2021-10-28 MED ORDER — FENTANYL CITRATE (PF) 100 MCG/2ML IJ SOLN
INTRAMUSCULAR | Status: AC
  Administered 2021-10-28: 25 ug via INTRAVENOUS
  Filled 2021-10-28: qty 2

## 2021-10-28 MED ORDER — KETAMINE HCL 50 MG/5ML IJ SOSY
PREFILLED_SYRINGE | INTRAMUSCULAR | Status: AC
  Filled 2021-10-28: qty 5

## 2021-10-28 MED ORDER — ROCURONIUM BROMIDE 100 MG/10ML IV SOLN
INTRAVENOUS | Status: DC | PRN
  Administered 2021-10-28: 20 mg via INTRAVENOUS
  Administered 2021-10-28: 15 mg via INTRAVENOUS
  Administered 2021-10-28: 50 mg via INTRAVENOUS

## 2021-10-28 MED ORDER — SUGAMMADEX SODIUM 200 MG/2ML IV SOLN
INTRAVENOUS | Status: DC | PRN
  Administered 2021-10-28: 200 mg via INTRAVENOUS

## 2021-10-28 MED ORDER — PHENYLEPHRINE HCL (PRESSORS) 10 MG/ML IV SOLN
INTRAVENOUS | Status: DC | PRN
  Administered 2021-10-28: 160 ug via INTRAVENOUS
  Administered 2021-10-28: 80 ug via INTRAVENOUS

## 2021-10-28 MED ORDER — OXYCODONE HCL 5 MG PO TABS: 5.0000 mg | ORAL_TABLET | ORAL | Status: DC | PRN

## 2021-10-28 MED ORDER — ACETAMINOPHEN 500 MG PO TABS
ORAL_TABLET | ORAL | Status: AC
  Administered 2021-10-28: 1000 mg via ORAL
  Filled 2021-10-28: qty 2

## 2021-10-28 MED ORDER — ONDANSETRON HCL 4 MG/2ML IJ SOLN
INTRAMUSCULAR | Status: DC | PRN
  Administered 2021-10-28: 4 mg via INTRAVENOUS

## 2021-10-28 MED ORDER — FENTANYL CITRATE (PF) 100 MCG/2ML IJ SOLN
INTRAMUSCULAR | Status: DC | PRN
  Administered 2021-10-28 (×2): 50 ug via INTRAVENOUS

## 2021-10-28 MED ORDER — ONDANSETRON HCL 4 MG/2ML IJ SOLN
INTRAMUSCULAR | Status: AC
  Filled 2021-10-28: qty 2

## 2021-10-28 MED ORDER — ESMOLOL HCL 100 MG/10ML IV SOLN
INTRAVENOUS | Status: DC | PRN
  Administered 2021-10-28: 30 mg via INTRAVENOUS

## 2021-10-28 MED ORDER — BUPIVACAINE-EPINEPHRINE (PF) 0.25% -1:200000 IJ SOLN
INTRAMUSCULAR | Status: DC | PRN
  Administered 2021-10-28: 30 mL

## 2021-10-28 MED ORDER — LIDOCAINE HCL (CARDIAC) PF 100 MG/5ML IV SOSY
PREFILLED_SYRINGE | INTRAVENOUS | Status: DC | PRN
  Administered 2021-10-28: 80 mg via INTRAVENOUS

## 2021-10-28 SURGICAL SUPPLY — 57 items
BAG INFUSER PRESSURE 100CC (MISCELLANEOUS) ×1 IMPLANT
CANNULA REDUC XI 12-8 STAPL (CANNULA) ×1
CANNULA REDUCER 12-8 DVNC XI (CANNULA) ×1 IMPLANT
CLIP LIGATING HEMO O LOK GREEN (MISCELLANEOUS) ×2 IMPLANT
CUP MEDICINE 2OZ PLAST GRAD ST (MISCELLANEOUS) ×1 IMPLANT
DECANTER SPIKE VIAL GLASS SM (MISCELLANEOUS) ×1 IMPLANT
DERMABOND ADVANCED (GAUZE/BANDAGES/DRESSINGS) ×1
DERMABOND ADVANCED .7 DNX12 (GAUZE/BANDAGES/DRESSINGS) ×1 IMPLANT
DRAPE ARM DVNC X/XI (DISPOSABLE) ×4 IMPLANT
DRAPE COLUMN DVNC XI (DISPOSABLE) ×1 IMPLANT
DRAPE DA VINCI XI ARM (DISPOSABLE) ×4
DRAPE DA VINCI XI COLUMN (DISPOSABLE) ×1
ELECT CAUTERY BLADE TIP 2.5 (TIP) ×2
ELECT REM PT RETURN 9FT ADLT (ELECTROSURGICAL) ×2
ELECTRODE CAUTERY BLDE TIP 2.5 (TIP) ×1 IMPLANT
ELECTRODE REM PT RTRN 9FT ADLT (ELECTROSURGICAL) ×1 IMPLANT
GAUZE 4X4 16PLY ~~LOC~~+RFID DBL (SPONGE) ×2 IMPLANT
GLOVE SURG SYN 7.0 (GLOVE) ×6 IMPLANT
GLOVE SURG SYN 7.0 PF PI (GLOVE) ×2 IMPLANT
GLOVE SURG SYN 7.5  E (GLOVE) ×3
GLOVE SURG SYN 7.5 E (GLOVE) ×3 IMPLANT
GLOVE SURG SYN 7.5 PF PI (GLOVE) ×2 IMPLANT
GOWN STRL REUS W/ TWL LRG LVL3 (GOWN DISPOSABLE) ×4 IMPLANT
GOWN STRL REUS W/TWL LRG LVL3 (GOWN DISPOSABLE) ×4
IRRIGATOR SUCT 8 DISP DVNC XI (IRRIGATION / IRRIGATOR) IMPLANT
IRRIGATOR SUCTION 8MM XI DISP (IRRIGATION / IRRIGATOR) ×1
IV NS 1000ML (IV SOLUTION) ×1
IV NS 1000ML BAXH (IV SOLUTION) IMPLANT
KIT OSTOMY 2 PC DRNBL 2.25 STR (WOUND CARE) IMPLANT
KIT OSTOMY DRAINABLE 2.25 STR (WOUND CARE) ×1
KIT PINK PAD W/HEAD ARE REST (MISCELLANEOUS) ×2
KIT PINK PAD W/HEAD ARM REST (MISCELLANEOUS) ×1 IMPLANT
LABEL OR SOLS (LABEL) ×2 IMPLANT
MANIFOLD NEPTUNE II (INSTRUMENTS) ×2 IMPLANT
NEEDLE HYPO 22GX1.5 SAFETY (NEEDLE) ×2 IMPLANT
NS IRRIG 500ML POUR BTL (IV SOLUTION) ×2 IMPLANT
OBTURATOR OPTICAL STANDARD 8MM (TROCAR) ×1
OBTURATOR OPTICAL STND 8 DVNC (TROCAR) ×1
OBTURATOR OPTICALSTD 8 DVNC (TROCAR) ×1 IMPLANT
PACK LAP CHOLECYSTECTOMY (MISCELLANEOUS) ×2 IMPLANT
PENCIL ELECTRO HAND CTR (MISCELLANEOUS) ×2 IMPLANT
POUCH SPECIMEN RETRIEVAL 10MM (ENDOMECHANICALS) ×2 IMPLANT
SEAL CANN UNIV 5-8 DVNC XI (MISCELLANEOUS) ×3 IMPLANT
SEAL XI 5MM-8MM UNIVERSAL (MISCELLANEOUS) ×3
SET TUBE SMOKE EVAC HIGH FLOW (TUBING) ×2 IMPLANT
SOLUTION ELECTROLUBE (MISCELLANEOUS) ×2 IMPLANT
SPONGE T-LAP 18X18 ~~LOC~~+RFID (SPONGE) IMPLANT
SPONGE T-LAP 4X18 ~~LOC~~+RFID (SPONGE) ×1 IMPLANT
STAPLER CANNULA SEAL DVNC XI (STAPLE) ×1 IMPLANT
STAPLER CANNULA SEAL XI (STAPLE) ×1
SUT MNCRL AB 4-0 PS2 18 (SUTURE) ×3 IMPLANT
SUT VIC AB 3-0 SH 27 (SUTURE)
SUT VIC AB 3-0 SH 27X BRD (SUTURE) IMPLANT
SUT VICRYL 0 AB UR-6 (SUTURE) ×4 IMPLANT
TAPE TRANSPORE STRL 2 31045 (GAUZE/BANDAGES/DRESSINGS) ×2 IMPLANT
TROCAR BALLN GELPORT 12X130M (ENDOMECHANICALS) ×2 IMPLANT
WATER STERILE IRR 500ML POUR (IV SOLUTION) ×1 IMPLANT

## 2021-10-28 NOTE — Interval H&P Note (Signed)
History and Physical Interval Note:  10/28/2021 7:20 AM  Gregory Oconnell  has presented today for surgery, with the diagnosis of cholecystitis.  The various methods of treatment have been discussed with the patient and family. After consideration of risks, benefits and other options for treatment, the patient has consented to  Procedure(s): XI ROBOTIC ASSISTED LAPAROSCOPIC CHOLECYSTECTOMY (N/A) [place] (ICG) (N/A) as a surgical intervention.  The patient's history has been reviewed, patient examined, no change in status, stable for surgery.  I have reviewed the patient's chart and labs.  Questions were answered to the patient's satisfaction.     [name]

## 2021-10-28 NOTE — Op Note (Signed)
°  Procedure Date:  10/28/2021  Pre-operative Diagnosis:  History of Acute cholecystitis  Post-operative Diagnosis: Acute on chronic cholecystitis  Procedure:  Robotic assisted cholecystectomy with ICG FireFly cholangiogram  Surgeon:  Melvyn Neth, MD  Assistant:  Rosita Fire, PA-S  Anesthesia:  General endotracheal  Estimated Blood Loss:  50 ml  Specimens:  gallbladder  Complications:  None  Indications for Procedure:  This is a 56 y.o. male who presents with abdominal pain and workup revealing acute cholecystitis, s/p percutaneous cholecystostomy drain placement.  The benefits, complications, treatment options, and expected outcomes were discussed with the patient. The risks of bleeding, infection, recurrence of symptoms, failure to resolve symptoms, bile duct damage, bile duct leak, retained common bile duct stone, bowel injury, and need for further procedures were all discussed with the patient and he was willing to proceed.  Description of Procedure: The patient was correctly identified in the preoperative area and brought into the operating room.  The patient was placed supine with VTE prophylaxis in place.  Appropriate time-outs were performed.  Anesthesia was induced and the patient was intubated.  Appropriate antibiotics were infused.  The patient's cholecystostomy drain bag was disconnected and the drain capped.  The patient's LLQ ostomy appliance was removed and the stoma cleaned and dressed with occlusive dressing. The abdomen was prepped and draped in a sterile fashion. A periumbilical incision was made. A cutdown technique was used to enter the abdominal cavity without injury, and a 12 mm robotic port was inserted.  Pneumoperitoneum was obtained with appropriate opening pressures.  Three 8-mm ports were placed in the mid abdomen at the level of the umbilicus under direct visualization.  The DaVinci platform was docked, camera targeted, and instruments were placed under  direct visualization.  The gallbladder was identified.  The fundus was grasped and retracted cephalad.  The patient's drain was removed without complications and the drain tract was transected and cauterized.  Adhesions were lysed bluntly and with electrocautery. The infundibulum was grasped and retracted laterally, exposing the peritoneum overlying the gallbladder.  This was incised with electrocautery and extended on either side of the gallbladder.  FireFly cholangiogram was then obtained, and we were able to clearly identify the cystic duct and common bile duct.  The cystic duct and cystic artery were carefully dissected with combination of cautery and blunt dissection.  Both were clipped twice proximally and once distally, cutting in between.  The gallbladder was taken from the gallbladder fossa in a retrograde fashion with electrocautery. There was some bile spillage.  There was some bleeding from the liver bed which was controlled with cautery.  The gallbladder was placed in an Endocatch bag. The liver bed was inspected and any bleeding was controlled with electrocautery. The right upper quadrant was then inspected again revealing intact clips, no bleeding, and no ductal injury.  The area was thoroughly irrigated.  The 8 mm ports were removed under direct visualization and the 12 mm port was removed.  The Endocatch bag was brought out via the umbilical incision. The fascial opening was closed using 0 vicryl suture.  Local anesthetic was infused in all incisions and the incisions were closed with 4-0 Monocryl.  The wounds were cleaned and sealed with DermaBond.  The patient was emerged from anesthesia and extubated and brought to the recovery room for further management.  The patient tolerated the procedure well and all counts were correct at the end of the case.   Melvyn Neth, MD

## 2021-10-28 NOTE — Discharge Instructions (Signed)
AMBULATORY SURGERY  ?DISCHARGE INSTRUCTIONS ? ? ?The drugs that you were given will stay in your system until tomorrow so for the next 24 hours you should not: ? ?Drive an automobile ?Make any legal decisions ?Drink any alcoholic beverage ? ? ?You may resume regular meals tomorrow.  Today it is better to start with liquids and gradually work up to solid foods. ? ?You may eat anything you prefer, but it is better to start with liquids, then soup and crackers, and gradually work up to solid foods. ? ? ?Please notify your doctor immediately if you have any unusual bleeding, trouble breathing, redness and pain at the surgery site, drainage, fever, or pain not relieved by medication. ? ? ? ?Additional Instructions: ? ? ? ?Please contact your physician with any problems or Same Day Surgery at [phone], Monday through Friday 6 am to 4 pm, or [facility] [place] Main number at [phone].  ?

## 2021-10-28 NOTE — Transfer of Care (Signed)
Immediate Anesthesia Transfer of Care Note  Patient: Gregory Oconnell  Procedure(s) Performed: XI ROBOTIC ASSISTED LAPAROSCOPIC CHOLECYSTECTOMY INDOCYANINE GREEN FLUORESCENCE IMAGING (ICG)  Patient Location: PACU  Anesthesia Type:General  Level of Consciousness: sedated and drowsy  Airway & Oxygen Therapy: Patient connected to face mask oxygen  Post-op Assessment: Report given to RN and Post -op Vital signs reviewed and stable  Post vital signs: Reviewed  Last Vitals:  Vitals Value Taken Time  BP 137/89 10/28/21 1058  Temp    Pulse 73 10/28/21 1058  Resp 13 10/28/21 1058  SpO2 98 % 10/28/21 1058  Vitals shown include unvalidated device data.  Last Pain:  Vitals:   10/28/21 0628  TempSrc: Temporal  PainSc: 0-No pain         Complications: No notable events documented.

## 2021-10-28 NOTE — Anesthesia Preprocedure Evaluation (Signed)
Anesthesia Evaluation  Patient identified by MRN, date of birth, ID band Patient awake    Reviewed: Allergy & Precautions, H&P , NPO status , Patient's Chart, lab work & pertinent test results, reviewed documented beta blocker date and time   Airway Mallampati: II  TM Distance: >3 FB Neck ROM: full    Dental  (+) Poor Dentition, Chipped, Missing, Loose   Pulmonary pneumonia, resolved,    Pulmonary exam normal        Cardiovascular Exercise Tolerance: Good hypertension, On Medications negative cardio ROS Normal cardiovascular exam Rhythm:regular Rate:Normal     Neuro/Psych PSYCHIATRIC DISORDERS Depression negative neurological ROS     GI/Hepatic Neg liver ROS, GERD  Medicated,  Endo/Other  negative endocrine ROS  Renal/GU Renal disease  negative genitourinary   Musculoskeletal   Abdominal   Peds  Hematology negative hematology ROS (+)   Anesthesia Other Findings Past Medical History: No date: Arthritis No date: Bulging lumbar disc No date: Chronic back pain No date: GERD (gastroesophageal reflux disease) No date: Hypertension No date: Kidney disease Past Surgical History: 08/08/2021: BLADDER REPAIR; N/A     Comment:  Procedure: BLADDER REPAIR injury;  Surgeon: Olean Ree, MD;  Location: [facility] ORS;  Service: General;                Laterality: N/A; 08/08/2021: COLECTOMY WITH COLOSTOMY CREATION/HARTMANN PROCEDURE; N/A     Comment:  Procedure: COLECTOMY WITH COLOSTOMY CREATION/HARTMANN               PROCEDURE;  Surgeon: Olean Ree, MD;  Location: [facility]               ORS;  Service: General;  Laterality: N/A; 02/03/2016: COLONOSCOPY WITH PROPOFOL; N/A     Comment:  Procedure: COLONOSCOPY WITH PROPOFOL;  Surgeon: Hulen Luster, MD;  Location: [facility] ENDOSCOPY;  Service:               Gastroenterology;  Laterality: N/A; 02/03/2016: ESOPHAGOGASTRODUODENOSCOPY (EGD) WITH PROPOFOL; N/A      Comment:  Procedure: ESOPHAGOGASTRODUODENOSCOPY (EGD) WITH               PROPOFOL;  Surgeon: Hulen Luster, MD;  Location: [facility]               ENDOSCOPY;  Service: Gastroenterology;  Laterality: N/A; 10/07/2021: IR CHOLANGIOGRAM EXISTING TUBE 08/28/2021: IR PERC CHOLECYSTOSTOMY No date: KNEE SURGERY 08/08/2021: LAPAROTOMY; N/A     Comment:  Procedure: EXPLORATORY LAPAROTOMY;  Surgeon: Olean Ree, MD;  Location: [facility] ORS;  Service: General;                Laterality: N/A; BMI    Body Mass Index: 29.16 kg/m     Reproductive/Obstetrics negative OB ROS                             Anesthesia Physical Anesthesia Plan  ASA: 3  Anesthesia Plan: General ETT   Post-op Pain Management:    Induction:   PONV Risk Score and Plan: 3  Airway Management Planned:   Additional Equipment:   Intra-op Plan:   Post-operative Plan:   Informed Consent: I have reviewed the patients History and Physical, chart, labs and discussed the  procedure including the risks, benefits and alternatives for the proposed anesthesia with the patient or authorized representative who has indicated his/her understanding and acceptance.     Dental Advisory Given  Plan Discussed with: CRNA  Anesthesia Plan Comments:         Anesthesia Quick Evaluation

## 2021-10-28 NOTE — Anesthesia Procedure Notes (Signed)
Procedure Name: Intubation Date/Time: 10/28/2021 8:07 AM Performed by: Loletha Grayer, CRNA Pre-anesthesia Checklist: Patient identified, Emergency Drugs available, Suction available, Patient being monitored and Timeout performed Patient Re-evaluated:Patient Re-evaluated prior to induction Oxygen Delivery Method: Circle system utilized Preoxygenation: Pre-oxygenation with 100% oxygen Induction Type: IV induction Ventilation: Mask ventilation without difficulty Laryngoscope Size: McGraph and 4 Grade View: Grade I Tube type: Oral Tube size: 7.5 mm Number of attempts: 1 Placement Confirmation: positive ETCO2, ETT inserted through vocal cords under direct vision and breath sounds checked- equal and bilateral Secured at: 22 cm Tube secured with: Tape Dental Injury: Teeth and Oropharynx as per pre-operative assessment

## 2021-10-29 LAB — SURGICAL PATHOLOGY

## 2021-10-29 NOTE — Anesthesia Postprocedure Evaluation (Signed)
Anesthesia Post Note  Patient: DEMICHAEL TRAUM  Procedure(s) Performed: XI ROBOTIC ASSISTED LAPAROSCOPIC CHOLECYSTECTOMY INDOCYANINE GREEN FLUORESCENCE IMAGING (ICG)  Patient location during evaluation: PACU Anesthesia Type: General Level of consciousness: awake and alert Pain management: pain level controlled Vital Signs Assessment: post-procedure vital signs reviewed and stable Respiratory status: spontaneous breathing, nonlabored ventilation, respiratory function stable and patient connected to nasal cannula oxygen Cardiovascular status: blood pressure returned to baseline and stable Postop Assessment: no apparent nausea or vomiting Anesthetic complications: no   No notable events documented.   Last Vitals:  Vitals:   10/28/21 1215 10/28/21 1301  BP: (!) 159/101 (!) 150/97  Pulse: 65   Resp: 16   Temp: (!) 36.1 C   SpO2: 95%     Last Pain:  Vitals:   10/28/21 1301  TempSrc:   PainSc: Madison Mirko Tailor

## 2021-11-10 ENCOUNTER — Encounter: Payer: Self-pay | Admitting: Gastroenterology

## 2021-11-11 ENCOUNTER — Encounter: Payer: Medicaid Other | Admitting: Physician Assistant

## 2021-11-11 ENCOUNTER — Encounter
Admission: RE | Disposition: A | Discharge status: Home / Self Care | Payer: Self-pay | Source: Home / Self Care | Attending: Gastroenterology

## 2021-11-11 ENCOUNTER — Ambulatory Visit: Payer: Medicaid Other | Admitting: Certified Registered Nurse Anesthetist

## 2021-11-11 ENCOUNTER — Encounter: Payer: Self-pay | Admitting: Gastroenterology

## 2021-11-11 ENCOUNTER — Ambulatory Visit
Admission: RE | Admit: 2021-11-11 | Discharge: 2021-11-11 | Disposition: A | Discharge status: Home / Self Care | Payer: Medicaid Other | Attending: Gastroenterology | Admitting: Gastroenterology

## 2021-11-11 DIAGNOSIS — K5732 Diverticulitis of large intestine without perforation or abscess without bleeding: Secondary | ICD-10-CM | POA: Insufficient documentation

## 2021-11-11 DIAGNOSIS — D122 Benign neoplasm of ascending colon: Secondary | ICD-10-CM | POA: Diagnosis not present

## 2021-11-11 DIAGNOSIS — F32A Depression, unspecified: Secondary | ICD-10-CM | POA: Diagnosis not present

## 2021-11-11 DIAGNOSIS — D123 Benign neoplasm of transverse colon: Secondary | ICD-10-CM | POA: Diagnosis not present

## 2021-11-11 DIAGNOSIS — F1290 Cannabis use, unspecified, uncomplicated: Secondary | ICD-10-CM | POA: Diagnosis not present

## 2021-11-11 DIAGNOSIS — Z1211 Encounter for screening for malignant neoplasm of colon: Secondary | ICD-10-CM

## 2021-11-11 DIAGNOSIS — K635 Polyp of colon: Secondary | ICD-10-CM | POA: Diagnosis not present

## 2021-11-11 DIAGNOSIS — I10 Essential (primary) hypertension: Secondary | ICD-10-CM | POA: Diagnosis not present

## 2021-11-11 DIAGNOSIS — K219 Gastro-esophageal reflux disease without esophagitis: Secondary | ICD-10-CM | POA: Insufficient documentation

## 2021-11-11 DIAGNOSIS — D120 Benign neoplasm of cecum: Secondary | ICD-10-CM | POA: Diagnosis not present

## 2021-11-11 HISTORY — DX: Depression, unspecified: F32.A

## 2021-11-11 HISTORY — PX: COLONOSCOPY WITH PROPOFOL: SHX5780

## 2021-11-11 SURGERY — COLONOSCOPY WITH PROPOFOL
Anesthesia: General

## 2021-11-11 MED ORDER — PROPOFOL 500 MG/50ML IV EMUL
INTRAVENOUS | Status: DC | PRN
  Administered 2021-11-11: 150 ug/kg/min via INTRAVENOUS

## 2021-11-11 MED ORDER — DEXMEDETOMIDINE (PRECEDEX) IN NS 20 MCG/5ML (4 MCG/ML) IV SYRINGE
PREFILLED_SYRINGE | INTRAVENOUS | Status: DC | PRN
  Administered 2021-11-11 (×2): 4 ug via INTRAVENOUS

## 2021-11-11 MED ORDER — PROPOFOL 10 MG/ML IV BOLUS
INTRAVENOUS | Status: DC | PRN
Start: 2021-11-11 — End: 2021-11-11
  Administered 2021-11-11: 20 mg via INTRAVENOUS
  Administered 2021-11-11: 60 mg via INTRAVENOUS
  Administered 2021-11-11: 20 mg via INTRAVENOUS

## 2021-11-11 MED ORDER — GLYCOPYRROLATE 0.2 MG/ML IJ SOLN
INTRAMUSCULAR | Status: AC
  Filled 2021-11-11: qty 1

## 2021-11-11 MED ORDER — DEXMEDETOMIDINE HCL IN NACL 200 MCG/50ML IV SOLN
INTRAVENOUS | Status: AC
  Filled 2021-11-11: qty 50

## 2021-11-11 MED ORDER — SODIUM CHLORIDE 0.9 % IV SOLN: INTRAVENOUS | Status: DC

## 2021-11-11 MED ORDER — LIDOCAINE HCL (PF) 2 % IJ SOLN
INTRAMUSCULAR | Status: AC
  Filled 2021-11-11: qty 5

## 2021-11-11 MED ORDER — LIDOCAINE HCL (CARDIAC) PF 100 MG/5ML IV SOSY
PREFILLED_SYRINGE | INTRAVENOUS | Status: DC | PRN
  Administered 2021-11-11: 50 mg via INTRAVENOUS

## 2021-11-11 MED ORDER — GLYCOPYRROLATE 0.2 MG/ML IJ SOLN
INTRAMUSCULAR | Status: DC | PRN
  Administered 2021-11-11: .2 mg via INTRAVENOUS

## 2021-11-11 MED ORDER — SIMETHICONE 40 MG/0.6ML PO SUSP
ORAL | Status: DC | PRN
  Administered 2021-11-11: 40 mg

## 2021-11-11 MED ORDER — PROPOFOL 500 MG/50ML IV EMUL
INTRAVENOUS | Status: AC
  Filled 2021-11-11: qty 50

## 2021-11-11 NOTE — Transfer of Care (Signed)
Immediate Anesthesia Transfer of Care Note  Patient: Gregory Oconnell  Procedure(s) Performed: COLONOSCOPY WITH PROPOFOL  Patient Location: Endoscopy Unit  Anesthesia Type:General  Level of Consciousness: drowsy  Airway & Oxygen Therapy: Patient Spontanous Breathing  Post-op Assessment: Report given to RN and Post -op Vital signs reviewed and stable  Post vital signs: Reviewed and stable  Last Vitals:  Vitals Value Taken Time  BP 109/89 11/11/21 0847  Temp    Pulse 91 11/11/21 0847  Resp 14 11/11/21 0847  SpO2 97 % 11/11/21 0847  Vitals shown include unvalidated device data.  Last Pain:  Vitals:   11/11/21 0847  TempSrc:   PainSc: 0-No pain         Complications: No notable events documented.

## 2021-11-11 NOTE — Op Note (Signed)
[facility] Patient Name: Gregory Oconnell Procedure Date: 11/11/2021 7:24 AM MRN: [id number] Account #: [id number] Date of Birth: 1965-12-08 Admit Type: Outpatient Age: 56 Room: [facility] ENDO ROOM 2 Gender: Male Note Status: Finalized Instrument Name: Colonoscope [id number] Procedure:             Colonoscopy Indications:           Follow-up of diverticulitis Providers:             [name] MD, MD Referring MD:          Edmonia Lynch. Aycock MD (Referring MD) Medicines:             Monitored Anesthesia Care Complications:         No immediate complications. Procedure:             Pre-Anesthesia Assessment:                        - Prior to the procedure, a History and Physical was                         performed, and patient medications, allergies and                         sensitivities were reviewed. The patient's tolerance                         of previous anesthesia was reviewed.                        - The risks and benefits of the procedure and the                         sedation options and risks were discussed with the                         patient. All questions were answered and informed                         consent was obtained.                        - ASA Grade Assessment: II - A patient with mild                         systemic disease.                        After obtaining informed consent, the colonoscope was                         passed under direct vision. Throughout the procedure,                         the patient's blood pressure, pulse, and oxygen                         saturations were monitored continuously. The                         Colonoscope was introduced through  the descending                         colostomy and advanced to the the cecum, identified by                         the appendiceal orifice. The colonoscopy was somewhat                         difficult due to the patient's agitation. The patient                          tolerated the procedure well. The quality of the bowel                         preparation was adequate. Findings:      The perianal and digital rectal examinations were normal.      A 5 mm polyp was found in the cecum. The polyp was sessile. The polyp       was removed with a cold snare. Resection and retrieval were complete.      A 12 mm polyp was found in the ascending colon. The polyp was sessile.       The polyp was removed with a piecemeal technique using a cold snare.       Resection and retrieval were complete. To prevent bleeding after the       polypectomy, two hemostatic clips were successfully placed. There was no       bleeding during, or at the end, of the procedure.      A 6 mm polyp was found in the ascending colon. The polyp was sessile.       The polyp was removed with a cold snare. Resection and retrieval were       complete.      Five sessile polyps were found in the transverse colon. The polyps were       5 to 7 mm in size. These polyps were removed with a cold snare.       Resection and retrieval were complete.      The exam was otherwise without abnormality.      Scope also inserted through anus to blind end - no abnornalities seen Impression:            - One 5 mm polyp in the cecum, removed with a cold                         snare. Resected and retrieved.                        - One 12 mm polyp in the ascending colon, removed                         piecemeal using a cold snare. Resected and retrieved.                         Clips were placed.                        - One 6 mm polyp in the ascending colon, removed with  a cold snare. Resected and retrieved.                        - Five 5 to 7 mm polyps in the transverse colon,                         removed with a cold snare. Resected and retrieved.                        - The examination was otherwise normal. Recommendation:        - Discharge patient to home  (with escort).                        - Resume previous diet.                        - Continue present medications.                        - Await pathology results.                        - Repeat colonoscopy in 6 months for surveillance                         after piecemeal polypectomy.                        - Can proceed with revision surgery Procedure Code(s):     --- Professional ---                        [phone], Colonoscopy through stoma; with removal of                         tumor(s), polyp(s), or other lesion(s) by snare                         technique Diagnosis Code(s):     --- Professional ---                        K63.5, Polyp of colon                        K57.32, Diverticulitis of large intestine without                         perforation or abscess without bleeding CPT copyright 2019 American Medical Association. All rights reserved. The codes documented in this report are preliminary and upon coder review may  be revised to meet current compliance requirements. [name], MD [name] MD, MD 11/11/2021 8:45:50 AM This report has been signed electronically. Number of Addenda: 0 Note Initiated On: 11/11/2021 7:24 AM Scope Withdrawal Time: 0 hours 21 minutes 17 seconds  Total Procedure Duration: 0 hours 27 minutes 41 seconds  Estimated Blood Loss:  Estimated blood loss: none.      [facility]

## 2021-11-11 NOTE — Anesthesia Preprocedure Evaluation (Signed)
Anesthesia Evaluation  Patient identified by MRN, date of birth, ID band Patient awake    Reviewed: Allergy & Precautions, NPO status , Patient's Chart, lab work & pertinent test results  History of Anesthesia Complications Negative for: history of anesthetic complications  Airway Mallampati: II  TM Distance: >3 FB Neck ROM: Full    Dental  (+) Poor Dentition, Missing, Chipped, Dental Advisory Given   Pulmonary neg pulmonary ROS, neg sleep apnea, neg COPD, Patient abstained from smoking.Not current smoker,    Pulmonary exam normal breath sounds clear to auscultation       Cardiovascular Exercise Tolerance: Good METShypertension, Pt. on medications (-) CAD and (-) Past MI (-) dysrhythmias  Rhythm:Regular Rate:Normal - Systolic murmurs    Neuro/Psych PSYCHIATRIC DISORDERS Depression negative neurological ROS     GI/Hepatic GERD  Medicated,(+)     substance abuse  marijuana use,   Endo/Other  neg diabetes  Renal/GU negative Renal ROS     Musculoskeletal   Abdominal   Peds  Hematology   Anesthesia Other Findings Past Medical History: No date: Arthritis No date: Bulging lumbar disc No date: Chronic back pain No date: Depression No date: GERD (gastroesophageal reflux disease) No date: Hypertension No date: Kidney disease  Reproductive/Obstetrics                             Anesthesia Physical Anesthesia Plan  ASA: 2  Anesthesia Plan: General   Post-op Pain Management: Minimal or no pain anticipated   Induction: Intravenous  PONV Risk Score and Plan: 2 and Propofol infusion, TIVA and Ondansetron  Airway Management Planned: Nasal Cannula  Additional Equipment: None  Intra-op Plan:   Post-operative Plan:   Informed Consent: I have reviewed the patients History and Physical, chart, labs and discussed the procedure including the risks, benefits and alternatives for the proposed  anesthesia with the patient or authorized representative who has indicated his/her understanding and acceptance.     Dental advisory given  Plan Discussed with: CRNA and Surgeon  Anesthesia Plan Comments: (Discussed risks of anesthesia with patient, including possibility of difficulty with spontaneous ventilation under anesthesia necessitating airway intervention, PONV, and rare risks such as cardiac or respiratory or neurological events, and allergic reactions. Discussed the role of CRNA in patient's perioperative care. Patient understands.)        Anesthesia Quick Evaluation

## 2021-11-11 NOTE — Anesthesia Postprocedure Evaluation (Signed)
Anesthesia Post Note  Patient: Gregory Oconnell  Procedure(s) Performed: COLONOSCOPY WITH PROPOFOL  Patient location during evaluation: Endoscopy Anesthesia Type: General Level of consciousness: awake and alert Pain management: pain level controlled Vital Signs Assessment: post-procedure vital signs reviewed and stable Respiratory status: spontaneous breathing, nonlabored ventilation, respiratory function stable and patient connected to nasal cannula oxygen Cardiovascular status: blood pressure returned to baseline and stable Postop Assessment: no apparent nausea or vomiting Anesthetic complications: no   No notable events documented.   Last Vitals:  Vitals:   11/11/21 0741 11/11/21 0847  BP: (!) 127/97 109/89  Pulse: 81   Resp: 20   Temp: (!) 36.2 C   SpO2: 99%     Last Pain:  Vitals:   11/11/21 0913  TempSrc:   PainSc: 0-No pain                 Arita Miss

## 2021-11-11 NOTE — H&P (Signed)
Gregory Bellows, MD [address] [place], [place], [place], [address] [address], [place], [place], [place], [address] Phone: [phone]  Fax: [phone]  Primary Care Physician:  Donnie Coffin, MD   Pre-Procedure History & Physical: HPI:  Gregory Oconnell is a 56 y.o. male is here for an colonoscopy.   Past Medical History:  Diagnosis Date   Arthritis    Bulging lumbar disc    Chronic back pain    Depression    GERD (gastroesophageal reflux disease)    Hypertension    Kidney disease     Past Surgical History:  Procedure Laterality Date   BLADDER REPAIR N/A 08/08/2021   Procedure: BLADDER REPAIR injury;  Surgeon: Olean Ree, MD;  Location: [facility] ORS;  Service: General;  Laterality: N/A;   COLECTOMY WITH COLOSTOMY CREATION/HARTMANN PROCEDURE N/A 08/08/2021   Procedure: COLECTOMY WITH COLOSTOMY CREATION/HARTMANN PROCEDURE;  Surgeon: Olean Ree, MD;  Location: [facility] ORS;  Service: General;  Laterality: N/A;   COLON SURGERY     COLONOSCOPY WITH PROPOFOL N/A 02/03/2016   Procedure: COLONOSCOPY WITH PROPOFOL;  Surgeon: Hulen Luster, MD;  Location: [facility] ENDOSCOPY;  Service: Gastroenterology;  Laterality: N/A;   ESOPHAGOGASTRODUODENOSCOPY (EGD) WITH PROPOFOL N/A 02/03/2016   Procedure: ESOPHAGOGASTRODUODENOSCOPY (EGD) WITH PROPOFOL;  Surgeon: Hulen Luster, MD;  Location: [facility] ENDOSCOPY;  Service: Gastroenterology;  Laterality: N/A;   IR CHOLANGIOGRAM EXISTING TUBE  10/07/2021   IR PERC CHOLECYSTOSTOMY  08/28/2021   KNEE SURGERY     LAPAROTOMY N/A 08/08/2021   Procedure: EXPLORATORY LAPAROTOMY;  Surgeon: Olean Ree, MD;  Location: [facility] ORS;  Service: General;  Laterality: N/A;    Prior to Admission medications   Medication Sig Start Date End Date Taking? Authorizing Provider  allopurinol (ZYLOPRIM) 300 MG tablet Take 300 mg by mouth every morning.   Yes [provider]  amLODipine (NORVASC) 5 MG tablet Take 5 mg by mouth every morning.   Yes [provider]  citalopram (CELEXA) 10 MG tablet Take 1 tablet (10 mg total) by mouth daily. Patient taking differently: Take 10 mg by mouth every morning. 08/31/21  Yes Piscoya, Jacqulyn Bath, MD  docusate sodium (COLACE) 100 MG capsule Take 100 mg by mouth 2 (two) times daily.   Yes [provider]  omeprazole (PRILOSEC) 40 MG capsule Take 40 mg by mouth 2 (two) times daily.   Yes [provider]  acetaminophen (TYLENOL) 500 MG tablet Take 2 tablets (1,000 mg total) by mouth every 6 (six) hours as needed for mild pain. 08/30/21   Olean Ree, MD  gabapentin (NEURONTIN) 800 MG tablet Take 800 mg by mouth 2 (two) times daily.    [provider]  ibuprofen (ADVIL) 200 MG tablet Take 3 tablets (600 mg total) by mouth every 8 (eight) hours as needed for moderate pain. 08/30/21   Olean Ree, MD  methocarbamol (ROBAXIN) 500 MG tablet Take 1,000 mg by mouth 3 (three) times daily.    [provider]  Multiple Vitamin (MULTIVITAMIN WITH MINERALS) TABS tablet Take 1 tablet by mouth daily.    [provider]  ondansetron (ZOFRAN-ODT) 4 MG disintegrating tablet Take 4 mg by mouth 3 (three) times daily as needed for nausea/vomiting.    [provider]  oxyCODONE (OXY IR/ROXICODONE) 5 MG immediate release tablet Take 1 tablet (5 mg total) by mouth every 4 (four) hours as needed for severe pain. 10/28/21   Olean Ree, MD  QUEtiapine (SEROQUEL) 25 MG tablet Take 1  tablet (25 mg total) by mouth at bedtime. 08/30/21   Olean Ree, MD  traZODone (DESYREL) 50 MG tablet Take 100 mg by mouth at bedtime.    [provider]  triamcinolone cream (KENALOG) 0.1 % Apply 1 application topically 2 (two) times daily as needed (rash).    [provider]    Allergies as of 10/23/2021   (No Known Allergies)    History reviewed. No pertinent family history.  Social History   Socioeconomic History   Marital status: Married    Spouse name: Not on file    Number of children: Not on file   Years of education: Not on file   Highest education level: Not on file  Occupational History   Not on file  Tobacco Use   Smoking status: Never   Smokeless tobacco: Never  Vaping Use   Vaping Use: Never used  Substance and Sexual Activity   Alcohol use: Not Currently    Comment: several beers every other week   Drug use: Yes    Types: Marijuana    Comment: smoked yesterday   Sexual activity: Not on file  Other Topics Concern   Not on file  Social History Narrative   Not on file   Social Determinants of Health   Financial Resource Strain: Not on file  Food Insecurity: Not on file  Transportation Needs: Not on file  Physical Activity: Not on file  Stress: Not on file  Social Connections: Not on file  Intimate Partner Violence: Not on file    Review of Systems: See HPI, otherwise negative ROS  Physical Exam: BP (!) 127/97    Pulse 81    Temp (!) 97.2 F (36.2 C) (Temporal)    Resp 20    Ht 6' (1.829 m)    Wt 99.8 kg    SpO2 99%    BMI 29.84 kg/m  General:   Alert,  pleasant and cooperative in NAD Head:  Normocephalic and atraumatic. Neck:  Supple; no masses or thyromegaly. Lungs:  Clear throughout to auscultation, normal respiratory effort.    Heart:  +S1, +S2, Regular rate and rhythm, No edema. Abdomen:  Soft, nontender and nondistended. Normal bowel sounds, without guarding, and without rebound.   Neurologic:  Alert and  oriented x4;  grossly normal neurologically.  Impression/Plan: Gregory Oconnell is here for an colonoscopy to be performed for  diverticulitis. Risks, benefits, limitations, and alternatives regarding  colonoscopy have been reviewed with the patient.  Questions have been answered.  All parties agreeable.   Gregory Bellows, MD  11/11/2021, 8:08 AM

## 2021-11-12 ENCOUNTER — Encounter: Payer: Medicaid Other | Admitting: Physician Assistant

## 2021-11-12 ENCOUNTER — Encounter: Payer: Self-pay | Admitting: Gastroenterology

## 2021-11-12 LAB — SURGICAL PATHOLOGY

## 2021-11-17 ENCOUNTER — Encounter: Payer: Self-pay | Admitting: Gastroenterology

## 2021-11-17 NOTE — Progress Notes (Signed)
All polyps taken out were pre cancerous repeat colonoscopy in 6-8 months to ensure no more are there

## 2021-11-18 ENCOUNTER — Ambulatory Visit (INDEPENDENT_AMBULATORY_CARE_PROVIDER_SITE_OTHER): Payer: Medicaid Other | Admitting: Physician Assistant

## 2021-11-18 ENCOUNTER — Encounter: Payer: Self-pay | Admitting: Physician Assistant

## 2021-11-18 ENCOUNTER — Telehealth: Payer: Self-pay

## 2021-11-18 ENCOUNTER — Other Ambulatory Visit: Payer: Self-pay

## 2021-11-18 VITALS — BP 131/90 | HR 92 | Temp 98.5°F | Wt 209.2 lb

## 2021-11-18 DIAGNOSIS — K8000 Calculus of gallbladder with acute cholecystitis without obstruction: Secondary | ICD-10-CM

## 2021-11-18 DIAGNOSIS — Z09 Encounter for follow-up examination after completed treatment for conditions other than malignant neoplasm: Secondary | ICD-10-CM

## 2021-11-18 MED ORDER — PROMETHAZINE HCL 12.5 MG PO TABS: 12.5000 mg | ORAL_TABLET | Freq: Four times a day (QID) | ORAL | 0 refills | Status: AC | PRN

## 2021-11-18 NOTE — Telephone Encounter (Signed)
-----   Message from [name], MD sent at 11/17/2021 12:19 PM EST ----- All polyps taken out were pre cancerous repeat colonoscopy in 6-8 months to ensure no more are there

## 2021-11-18 NOTE — Telephone Encounter (Signed)
Called patient to let him know the below information. Patient stated that he went to see his surgeon today and was told that he would need a colonoscopy prior to having his colon surgery. I told him to give [place] a call once his surgeon gives him a date as of when he will need the repair so we could do his colonoscopy prior. Patient agreed and had no further questions.

## 2021-11-18 NOTE — Progress Notes (Signed)
[facility] POST-OP OFFICE VISIT  11/19/2021  HPI: Gregory Oconnell is a 56 y.o. male 21 days s/p robotic assisted laparoscopic cholecystectomy for acute on chronic cholecystitis with Dr Hampton Abbot. He is also s/p Hartmann's on 08/08/2021 as well.   Overall, his biggest concern this afternoon is intermittent nausea and occasional emesis. He states he may have this 2-3 times a week at the worst. Typically improves without intervention. He does have Zofran at home, which helps only some. He continues to have ostomy function and is without any other signs of obstruction. Appetite decreased.   Had colonoscopy with Dr Vicente Males on 01/24,reviewed, polyps pre-cancerous, plan for repeat in 6-8 months. Notes state he is okay to proceed with revision.   Vital signs: BP 131/90    Pulse 92    Temp 98.5 F (36.9 C) (Oral)    Wt 209 lb 3.2 oz (94.9 kg)    SpO2 98%    BMI 28.37 kg/m    Physical Exam: Constitutional: Well appearing male, NAD Abdomen: Soft, non-tender, non-distended, no rebound/guarding Skin: Laparoscopic incisions are healing well, no erythema or drainage   Assessment/Plan: This is a 56 y.o. male 21 days s/p robotic assisted laparoscopic cholecystectomy for acute on chronic cholecystitis with Dr Hampton Abbot. He is also s/p Hartmann's on 08/08/2021 as well    - Will send phenergan to see if this helps with nausea  - Encouraged nutritional supplementation with protein supplements/shakes  - Pain control prn  - Reviewed wound care recommendation  - Reviewed lifting restrictions; 4 weeks total  - Reviewed surgical pathology; [place]  - Follow up in April for colectomy takedown planning; reviewed case with Dr Hampton Abbot and Dr Georgeann Oppenheim notes. Okay to proceed with revision.   -- Edison Simon, PA-C [facility] 11/19/2021, 10:51 AM [phone] M-F: 7am - 4pm

## 2021-11-18 NOTE — Patient Instructions (Addendum)
Please pick up your prescription from your pharmacy.  Drink Ensure or any protein shake to replace a meal.  If you have any concerns or questions, please feel free to call our office. See follow up appointment below.    GENERAL POST-OPERATIVE PATIENT INSTRUCTIONS   WOUND CARE INSTRUCTIONS:  Keep a dry clean dressing on the wound if there is drainage. The initial bandage may be removed after 24 hours.  Once the wound has quit draining you may leave it open to air.  If clothing rubs against the wound or causes irritation and the wound is not draining you may cover it with a dry dressing during the daytime.  Try to keep the wound dry and avoid ointments on the wound unless directed to do so.  If the wound becomes bright red and painful or starts to drain infected material that is not clear, please contact your physician immediately.  If the wound is mildly pink and has a thick firm ridge underneath it, this is normal, and is referred to as a healing ridge.  This will resolve over the next 4-6 weeks.  BATHING: You may shower if you have been informed of this by your surgeon. However, Please do not submerge in a tub, hot tub, or pool until incisions are completely sealed or have been told by your surgeon that you may do so.  DIET:  You may eat any foods that you can tolerate.  It is a good idea to eat a high fiber diet and take in plenty of fluids to prevent constipation.  If you do become constipated you may want to take a mild laxative or take ducolax tablets on a daily basis until your bowel habits are regular.  Constipation can be very uncomfortable, along with straining, after recent surgery.  ACTIVITY:  You are encouraged to cough and deep breath or use your incentive spirometer if you were given one, every 15-30 minutes when awake.  This will help prevent respiratory complications and low grade fevers post-operatively if you had a general anesthetic.  You may want to hug a pillow when coughing and  sneezing to add additional support to the surgical area, if you had abdominal or chest surgery, which will decrease pain during these times.  You are encouraged to walk and engage in light activity for the next two weeks.  You should not lift, push, or pull more than 15-20 pounds, until 11/25/2021 as it could put you at increased risk for complications.  Twenty pounds is roughly equivalent to a plastic bag of groceries. At that time- Listen to your body when lifting, if you have pain when lifting, stop and then try again in a few days. Soreness after doing exercises or activities of daily living is normal as you get back in to your normal routine.  MEDICATIONS:  Try to take narcotic medications and anti-inflammatory medications, such as tylenol, ibuprofen, naprosyn, etc., with food.  This will minimize stomach upset from the medication.  Should you develop nausea and vomiting from the pain medication, or develop a rash, please discontinue the medication and contact your physician.  You should not drive, make important decisions, or operate machinery when taking narcotic pain medication.  SUNBLOCK Use sun block to incision area over the next year if this area will be exposed to sun. This helps decrease scarring and will allow you avoid a permanent darkened area over your incision.  QUESTIONS:  Please feel free to call our office if you have any  questions, and we will be glad to assist you.

## 2021-11-19 ENCOUNTER — Encounter: Payer: Self-pay | Admitting: Physician Assistant

## 2021-12-16 ENCOUNTER — Other Ambulatory Visit: Payer: Self-pay | Admitting: Surgery

## 2022-01-19 ENCOUNTER — Ambulatory Visit (INDEPENDENT_AMBULATORY_CARE_PROVIDER_SITE_OTHER): Payer: Medicaid Other | Admitting: Surgery

## 2022-01-19 ENCOUNTER — Telehealth: Payer: Self-pay

## 2022-01-19 ENCOUNTER — Encounter: Payer: Self-pay | Admitting: Surgery

## 2022-01-19 VITALS — BP 133/85 | HR 81 | Temp 98.7°F | Ht 72.0 in | Wt 204.0 lb

## 2022-01-19 DIAGNOSIS — K5720 Diverticulitis of large intestine with perforation and abscess without bleeding: Secondary | ICD-10-CM

## 2022-01-19 DIAGNOSIS — Z933 Colostomy status: Secondary | ICD-10-CM | POA: Diagnosis not present

## 2022-01-19 MED ORDER — METRONIDAZOLE 500 MG PO TABS: ORAL_TABLET | ORAL | 0 refills | Status: DC

## 2022-01-19 MED ORDER — POLYETHYLENE GLYCOL 3350 17 GM/SCOOP PO POWD: ORAL | 0 refills | Status: DC

## 2022-01-19 MED ORDER — NEOMYCIN SULFATE 500 MG PO TABS: ORAL_TABLET | ORAL | 0 refills | Status: DC

## 2022-01-19 NOTE — Patient Instructions (Signed)
Our surgery scheduler Pamala Hurry will call you within 24-48 hours to get you scheduled. If you have not heard from her after 48 hours, please call our office. Have the blue sheet available when she calls to write down important information. ? ?If you have any concerns or questions, please feel free to call our office.  ? ? ?Colostomy Reversal Surgery ?A colostomy reversal is a surgical procedure that is done to reverse a colostomy. In this reversal procedure, the large intestine is disconnected from the opening in the abdomen (stoma). Then, the changes that were made to the intestine during the colostomy will be reversed to restore the flow of stool through the entire intestine. Depending on the type of colostomy being reversed, this may involve one of the following: ?Reconnecting the two ends of the intestine that were separated during colostomy surgery. ?Closing the opening that was made in the side of the intestine to allow stool to be redirected through the stoma. ?After this surgery, a stoma and colostomy bag are no longer needed. Stool (feces) can leave your body through the rectum, as it did before you had a colostomy. ?Tell a health care provider about: ?Any allergies you have. ?All medicines you are taking, including vitamins, herbs, eye drops, creams, and over-the-counter medicines. ?Any problems you or family members have had with anesthetic medicines. ?Any blood disorders you have. ?Any surgeries you have had. ?Any medical conditions you have. ?Whether you are pregnant or may be pregnant. ?What are the risks? ?Generally, this is a safe procedure. However, problems may occur, including: ?Infection. ?Bleeding. ?Allergic reactions to medicines. ?Damage to other structures or organs. ?A temporary condition in which the intestines stop moving and working correctly (ileus). This usually goes away in 3-7 days. ?A collection of pus (abscess) in the abdomen or pelvis. ?Intestinal blockage. ?Leaking at the area of  the intestine where it was reconnected (anastomotic leak) or where the opening of the stoma was closed. ?Narrowing of the intestine (stricture) at the place where it was reconnected. ?Urinary and sexual dysfunction. ?What happens before the procedure? ?Medicines ?Ask your health care provider about: ?Changing or stopping your regular medicines. This is especially important if you are taking diabetes medicines or blood thinners. ?Taking medicines such as aspirin and ibuprofen. These medicines can thin your blood. Do not take these medicines unless your health care provider tells you to take them. ?Taking over-the-counter medicines, vitamins, herbs, and supplements. ?Staying hydrated ?Follow instructions from your health care provider about hydration, which may include: ?Up to 2 hours before the procedure - you may continue to drink clear liquids, such as water, clear fruit juice, black coffee, and plain tea. ? ?Eating and drinking restrictions ?Follow instructions from your health care provider about eating and drinking, which may include: ?8 hours before the procedure - stop eating heavy meals or foods, such as meat, fried foods, or fatty foods. ?6 hours before the procedure - stop eating light meals or foods, such as toast or cereal. ?6 hours before the procedure - stop drinking milk or drinks that contain milk. ?2 hours before the procedure - stop drinking clear liquids. ?General instructions ?You may have an exam or testing. ?Plan to have someone take you home after the procedure. ?Plan to have a responsible adult care for you for at least 24 hours after you leave the hospital or clinic. This is important. ?Do not use any products that contain nicotine or tobacco, such as cigarettes, e-cigarettes, and chewing tobacco. These can delay  incision healing after surgery. If you need help quitting, ask your health care provider. ?Ask your health care provider what steps will be taken to help prevent infection. These may  include: ?Removing hair at the surgery site. ?Washing skin with a germ-killing soap. ?Antibiotic medicine. ?What happens during the procedure? ? ?An IV will be inserted into one of your veins. ?You may be given: ?A medicine to help you relax (sedative). ?A medicine to make you fall asleep (general anesthetic). ?An incision will be made in your abdomen at the site of the stoma. ?The large intestine will be disconnected from the abdomen at the site of the stoma. ?The next steps will vary depending on the type of colostomy reversal surgery you are having. There are two main types: ?End colostomy reversal. The surgeon will use stitches (sutures) or staples to reconnect the two ends of the intestine that were separated during the end colostomy. ?Loop colostomy reversal. The surgeon will use sutures or staples to close the opening in the intestine that had been allowing stool to be redirected through the stoma. The intestine will then be put back into its normal position inside the abdomen. ?The incision will be closed with sutures, skin glue, or adhesive strips. It may be covered with bandages (dressings). ?The procedure may vary among health care providers and hospitals. ?What happens after the procedure? ?Your blood pressure, heart rate, breathing rate, and blood oxygen level will be monitored until you leave the hospital or clinic. ?You will be given pain medicine as needed. ?You will slowly increase your diet and movement as told by your health care provider. ?Summary ?A colostomy reversal is a surgical procedure that is done to reverse a colostomy. After this surgery, stool (feces) can leave your body through the rectum, as it did before you had a colostomy. ?Before the procedure, follow instructions from your health care provider about taking medicines and about eating and drinking. ?During the procedure, your colostomy will be reversed, and the incision will be closed with sutures, skin glue, or adhesive strips.  It may be covered with bandages (dressings). ?After the procedure, you will slowly increase your diet and movement as told by your health care provider. ?This information is not intended to replace advice given to you by your health care provider. Make sure you discuss any questions you have with your health care provider. ?Document Revised: 03/23/2018 Document Reviewed: 03/23/2018 ?Elsevier Patient Education ? [place]. ? ?

## 2022-01-19 NOTE — Telephone Encounter (Signed)
Faxed medical clearance to Dr. Tomasa Hose at [phone]. ?

## 2022-01-19 NOTE — H&P (View-Only) (Signed)
?01/19/2022 ? ?History of Present Illness: ?Gregory Oconnell is a 56 y.o. male presenting for follow up and to discuss colostomy reversal.  He had a Hartmann's procedure on 08/08/21 for perforated diverticulitis.  He also is s/p robotic assisted cholecystectomy on 10/28/21.  He had a colonoscopy on 11/11/21 which showed multiple polyps, but no high grade dysplasia or malignancy.  He reports that he's doing well, though he's had issues with nausea and emesis at times depending on what he eats.  The ostomy has not given him any issues and denies any bulging around the stoma.  Denies any worsening abdominal pain, issues with the incisions, or other concerns.  He is looking forward to have his colostomy reversed. ? ?Past Medical History: ?Past Medical History:  ?Diagnosis Date  ? Arthritis   ? Bulging lumbar disc   ? Chronic back pain   ? Depression   ? GERD (gastroesophageal reflux disease)   ? Hypertension   ? Kidney disease   ?  ? ?Past Surgical History: ?Past Surgical History:  ?Procedure Laterality Date  ? BLADDER REPAIR N/A 08/08/2021  ? Procedure: BLADDER REPAIR injury;  Surgeon: Olean Ree, MD;  Location: [facility] ORS;  Service: General;  Laterality: N/A;  ? COLECTOMY WITH COLOSTOMY CREATION/HARTMANN PROCEDURE N/A 08/08/2021  ? Procedure: COLECTOMY WITH COLOSTOMY CREATION/HARTMANN PROCEDURE;  Surgeon: Olean Ree, MD;  Location: [facility] ORS;  Service: General;  Laterality: N/A;  ? COLON SURGERY    ? COLONOSCOPY WITH PROPOFOL N/A 02/03/2016  ? Procedure: COLONOSCOPY WITH PROPOFOL;  Surgeon: Hulen Luster, MD;  Location: [facility] ENDOSCOPY;  Service: Gastroenterology;  Laterality: N/A;  ? COLONOSCOPY WITH PROPOFOL N/A 11/11/2021  ? Procedure: COLONOSCOPY WITH PROPOFOL;  Surgeon: [name], MD;  Location: [facility] ENDOSCOPY;  Service: Gastroenterology;  Laterality: N/A;  ? ESOPHAGOGASTRODUODENOSCOPY (EGD) WITH PROPOFOL N/A 02/03/2016  ? Procedure: ESOPHAGOGASTRODUODENOSCOPY (EGD) WITH PROPOFOL;  Surgeon: Hulen Luster, MD;  Location:  [facility] ENDOSCOPY;  Service: Gastroenterology;  Laterality: N/A;  ? IR CHOLANGIOGRAM EXISTING TUBE  10/07/2021  ? IR PERC CHOLECYSTOSTOMY  08/28/2021  ? KNEE SURGERY    ? LAPAROTOMY N/A 08/08/2021  ? Procedure: EXPLORATORY LAPAROTOMY;  Surgeon: Olean Ree, MD;  Location: [facility] ORS;  Service: General;  Laterality: N/A;  ? ? ?Home Medications: ?Prior to Admission medications   ?Medication Sig Start Date End Date Taking? Authorizing Provider  ?acetaminophen (TYLENOL) 500 MG tablet Take 2 tablets (1,000 mg total) by mouth every 6 (six) hours as needed for mild pain. 08/30/21  Yes Jezabelle Chisolm, Jacqulyn Bath, MD  ?allopurinol (ZYLOPRIM) 300 MG tablet Take 300 mg by mouth every morning.   Yes [provider]  ?amLODipine (NORVASC) 5 MG tablet Take 5 mg by mouth every morning.   Yes [provider]  ?citalopram (CELEXA) 10 MG tablet Take 1 tablet (10 mg total) by mouth daily. ?Patient taking differently: Take 10 mg by mouth every morning. 08/31/21  Yes Heba Ige, Jacqulyn Bath, MD  ?docusate sodium (COLACE) 100 MG capsule Take 100 mg by mouth 2 (two) times daily.   Yes [provider]  ?gabapentin (NEURONTIN) 800 MG tablet Take 800 mg by mouth 2 (two) times daily.   Yes [provider]  ?ibuprofen (ADVIL) 200 MG tablet Take 3 tablets (600 mg total) by mouth every 8 (eight) hours as needed for moderate pain. 08/30/21  Yes Stephanine Reas, Jacqulyn Bath, MD  ?methocarbamol (ROBAXIN) 500 MG tablet Take 1,000 mg by mouth 3 (three) times daily.   Yes [provider]  ?metroNIDAZOLE (FLAGYL) 500 MG tablet  Take 2 tabs (1000 mg total) at 8 am, again at 2 pm, and again at 8 pm the day prior to surgery. 01/19/22  Yes Ignacia Gentzler, Jacqulyn Bath, MD  ?Multiple Vitamin (MULTIVITAMIN WITH MINERALS) TABS tablet Take 1 tablet by mouth daily.   Yes [provider]  ?neomycin (MYCIFRADIN) 500 MG tablet Take 2 tabs (1000 mg total) at 8 am, again at 2 pm, and again at 8 pm the day prior to surgery. 01/19/22  Yes Kielee Care, Jacqulyn Bath, MD  ?omeprazole  (PRILOSEC) 40 MG capsule Take 40 mg by mouth 2 (two) times daily.   Yes [provider]  ?ondansetron (ZOFRAN-ODT) 4 MG disintegrating tablet Take 4 mg by mouth 3 (three) times daily as needed for nausea/vomiting.   Yes [provider]  ?oxyCODONE (OXY IR/ROXICODONE) 5 MG immediate release tablet Take 1 tablet (5 mg total) by mouth every 4 (four) hours as needed for severe pain. 10/28/21  Yes Susane Bey, Jacqulyn Bath, MD  ?polyethylene glycol powder (MIRALAX) 17 GM/SCOOP powder Mix with 64 ounces of Gatorade (no red) the day prior to surgery and drink as directed. 01/19/22  Yes Jayon Matton, Jacqulyn Bath, MD  ?promethazine (PHENERGAN) 12.5 MG tablet Take 1 tablet (12.5 mg total) by mouth every 6 (six) hours as needed for nausea or vomiting. 11/18/21  Yes Tylene Fantasia, PA-C  ?QUEtiapine (SEROQUEL) 25 MG tablet Take 1 tablet (25 mg total) by mouth at bedtime. 08/30/21  Yes Kerem Gilmer, Jacqulyn Bath, MD  ?traZODone (DESYREL) 50 MG tablet Take 100 mg by mouth at bedtime.   Yes [provider]  ?triamcinolone cream (KENALOG) 0.1 % Apply 1 application topically 2 (two) times daily as needed (rash).   Yes [provider]  ? ? ?Allergies: ?No Known Allergies ? ?Review of Systems: ?Review of Systems  ?Constitutional:  Negative for chills and fever.  ?HENT:  Negative for hearing loss.   ?Respiratory:  Negative for shortness of breath.   ?Cardiovascular:  Negative for chest pain.  ?Gastrointestinal:  Positive for nausea and vomiting. Negative for abdominal pain, constipation and diarrhea.  ?Genitourinary:  Negative for dysuria.  ?Musculoskeletal:  Negative for myalgias.  ?Skin:  Negative for rash.  ?Neurological:  Negative for dizziness.  ?Psychiatric/Behavioral:  Negative for depression.   ? ?Physical Exam ?BP 133/85   Pulse 81   Temp 98.7 ?F (37.1 ?C) (Oral)   Ht 6' (1.829 m)   Wt 204 lb (92.5 kg)   SpO2 99%   BMI 27.67 kg/m?  ?CONSTITUTIONAL: No acute distress, well nourished. ?HEENT:  Normocephalic, atraumatic,  extraocular motion intact. ?RESPIRATORY:  Lungs are clear, and breath sounds are equal bilaterally. Normal respiratory effort without pathologic use of accessory muscles. ?CARDIOVASCULAR: Heart is regular without murmurs, gallops, or rubs. ?GI: The abdomen is soft, non-distended, non-tender to palpation.  His midline incision is well healed without any hernia evidence.  His robotic port sites are also well healed.  His ostomy is pink and patent, without evidence of parastomal hernia. ?NEUROLOGIC:  Motor and sensation is grossly normal.  Cranial nerves are grossly intact. ?PSYCH:  Alert and oriented to person, place and time. Affect is normal. ? ?Labs/Imaging: ?Colonoscopy 11/11/21: ?IMPRESSION: ?- One 5 mm polyp in the cecum, removed with a cold snare. Resected and retrieved. ?- One 12 mm polyp in the ascending colon, removed piecemeal using a cold snare. Resected and retrieved. Clips ?were placed. ?- One 6 mm polyp in the ascending colon, removed with a cold snare. Resected and retrieved. ?- Five 5 to 7 mm polyps  in the transverse colon, removed with a cold snare. Resected and retrieved. ?- The examination was otherwise normal. ? ?Labs on 10/22/21: ?Na 138, K 3.6, Cl 102, CO2 25, BUN 12, Cr 0.83.  LFTs within normal.  WBC 11.7, Hgb 12, Hct 38.5, Plt 332. ? ?Assessment and Plan: ?This is a 56 y.o. male s/p Hartmann's procedure for perforated diverticulitis, now presenting to discuss colostomy reversal. ? ?--Discussed with him the plan for a minimally invasive robotic approach to his colostomy reversal.  Discussed with him the incisions, the surgery at length including the risks of bleeding, infection, injury to surrounding structures, possibility of drains, possibility of open surgery if there's too much scarring or inflammation, that Urology team would help [place] with cystoscopy and injection of ICG into bilateral ureters, hospital stay, recovery time, post-operative restrictions, and he's willing to proceed. ?--Will  schedule him for surgery on 02/10/22.  Will also send medical clearance to his PCP.  All questions have been answered. ? ?I spent 40 minutes dedicated to the care of this patient on the date of this encounter to includ

## 2022-01-19 NOTE — Progress Notes (Signed)
?01/19/2022 ? ?History of Present Illness: ?Gregory Oconnell is a 56 y.o. male presenting for follow up and to discuss colostomy reversal.  He had a Hartmann's procedure on 08/08/21 for perforated diverticulitis.  He also is s/p robotic assisted cholecystectomy on 10/28/21.  He had a colonoscopy on 11/11/21 which showed multiple polyps, but no high grade dysplasia or malignancy.  He reports that he's doing well, though he's had issues with nausea and emesis at times depending on what he eats.  The ostomy has not given him any issues and denies any bulging around the stoma.  Denies any worsening abdominal pain, issues with the incisions, or other concerns.  He is looking forward to have his colostomy reversed. ? ?Past Medical History: ?Past Medical History:  ?Diagnosis Date  ? Arthritis   ? Bulging lumbar disc   ? Chronic back pain   ? Depression   ? GERD (gastroesophageal reflux disease)   ? Hypertension   ? Kidney disease   ?  ? ?Past Surgical History: ?Past Surgical History:  ?Procedure Laterality Date  ? BLADDER REPAIR N/A 08/08/2021  ? Procedure: BLADDER REPAIR injury;  Surgeon: Olean Ree, MD;  Location: [facility] ORS;  Service: General;  Laterality: N/A;  ? COLECTOMY WITH COLOSTOMY CREATION/HARTMANN PROCEDURE N/A 08/08/2021  ? Procedure: COLECTOMY WITH COLOSTOMY CREATION/HARTMANN PROCEDURE;  Surgeon: Olean Ree, MD;  Location: [facility] ORS;  Service: General;  Laterality: N/A;  ? COLON SURGERY    ? COLONOSCOPY WITH PROPOFOL N/A 02/03/2016  ? Procedure: COLONOSCOPY WITH PROPOFOL;  Surgeon: Hulen Luster, MD;  Location: [facility] ENDOSCOPY;  Service: Gastroenterology;  Laterality: N/A;  ? COLONOSCOPY WITH PROPOFOL N/A 11/11/2021  ? Procedure: COLONOSCOPY WITH PROPOFOL;  Surgeon: [name], MD;  Location: [facility] ENDOSCOPY;  Service: Gastroenterology;  Laterality: N/A;  ? ESOPHAGOGASTRODUODENOSCOPY (EGD) WITH PROPOFOL N/A 02/03/2016  ? Procedure: ESOPHAGOGASTRODUODENOSCOPY (EGD) WITH PROPOFOL;  Surgeon: Hulen Luster, MD;  Location:  [facility] ENDOSCOPY;  Service: Gastroenterology;  Laterality: N/A;  ? IR CHOLANGIOGRAM EXISTING TUBE  10/07/2021  ? IR PERC CHOLECYSTOSTOMY  08/28/2021  ? KNEE SURGERY    ? LAPAROTOMY N/A 08/08/2021  ? Procedure: EXPLORATORY LAPAROTOMY;  Surgeon: Olean Ree, MD;  Location: [facility] ORS;  Service: General;  Laterality: N/A;  ? ? ?Home Medications: ?Prior to Admission medications   ?Medication Sig Start Date End Date Taking? Authorizing Provider  ?acetaminophen (TYLENOL) 500 MG tablet Take 2 tablets (1,000 mg total) by mouth every 6 (six) hours as needed for mild pain. 08/30/21  Yes Henli Hey, Jacqulyn Bath, MD  ?allopurinol (ZYLOPRIM) 300 MG tablet Take 300 mg by mouth every morning.   Yes [provider]  ?amLODipine (NORVASC) 5 MG tablet Take 5 mg by mouth every morning.   Yes [provider]  ?citalopram (CELEXA) 10 MG tablet Take 1 tablet (10 mg total) by mouth daily. ?Patient taking differently: Take 10 mg by mouth every morning. 08/31/21  Yes Angelo Caroll, Jacqulyn Bath, MD  ?docusate sodium (COLACE) 100 MG capsule Take 100 mg by mouth 2 (two) times daily.   Yes [provider]  ?gabapentin (NEURONTIN) 800 MG tablet Take 800 mg by mouth 2 (two) times daily.   Yes [provider]  ?ibuprofen (ADVIL) 200 MG tablet Take 3 tablets (600 mg total) by mouth every 8 (eight) hours as needed for moderate pain. 08/30/21  Yes Chirstopher Iovino, Jacqulyn Bath, MD  ?methocarbamol (ROBAXIN) 500 MG tablet Take 1,000 mg by mouth 3 (three) times daily.   Yes [provider]  ?metroNIDAZOLE (FLAGYL) 500 MG tablet  Take 2 tabs (1000 mg total) at 8 am, again at 2 pm, and again at 8 pm the day prior to surgery. 01/19/22  Yes Jolita Haefner, Jacqulyn Bath, MD  ?Multiple Vitamin (MULTIVITAMIN WITH MINERALS) TABS tablet Take 1 tablet by mouth daily.   Yes [provider]  ?neomycin (MYCIFRADIN) 500 MG tablet Take 2 tabs (1000 mg total) at 8 am, again at 2 pm, and again at 8 pm the day prior to surgery. 01/19/22  Yes Perla Echavarria, Jacqulyn Bath, MD  ?omeprazole  (PRILOSEC) 40 MG capsule Take 40 mg by mouth 2 (two) times daily.   Yes [provider]  ?ondansetron (ZOFRAN-ODT) 4 MG disintegrating tablet Take 4 mg by mouth 3 (three) times daily as needed for nausea/vomiting.   Yes [provider]  ?oxyCODONE (OXY IR/ROXICODONE) 5 MG immediate release tablet Take 1 tablet (5 mg total) by mouth every 4 (four) hours as needed for severe pain. 10/28/21  Yes Alveda Vanhorne, Jacqulyn Bath, MD  ?polyethylene glycol powder (MIRALAX) 17 GM/SCOOP powder Mix with 64 ounces of Gatorade (no red) the day prior to surgery and drink as directed. 01/19/22  Yes Betsi Crespi, Jacqulyn Bath, MD  ?promethazine (PHENERGAN) 12.5 MG tablet Take 1 tablet (12.5 mg total) by mouth every 6 (six) hours as needed for nausea or vomiting. 11/18/21  Yes Tylene Fantasia, PA-C  ?QUEtiapine (SEROQUEL) 25 MG tablet Take 1 tablet (25 mg total) by mouth at bedtime. 08/30/21  Yes Nehan Flaum, Jacqulyn Bath, MD  ?traZODone (DESYREL) 50 MG tablet Take 100 mg by mouth at bedtime.   Yes [provider]  ?triamcinolone cream (KENALOG) 0.1 % Apply 1 application topically 2 (two) times daily as needed (rash).   Yes [provider]  ? ? ?Allergies: ?No Known Allergies ? ?Review of Systems: ?Review of Systems  ?Constitutional:  Negative for chills and fever.  ?HENT:  Negative for hearing loss.   ?Respiratory:  Negative for shortness of breath.   ?Cardiovascular:  Negative for chest pain.  ?Gastrointestinal:  Positive for nausea and vomiting. Negative for abdominal pain, constipation and diarrhea.  ?Genitourinary:  Negative for dysuria.  ?Musculoskeletal:  Negative for myalgias.  ?Skin:  Negative for rash.  ?Neurological:  Negative for dizziness.  ?Psychiatric/Behavioral:  Negative for depression.   ? ?Physical Exam ?BP 133/85   Pulse 81   Temp 98.7 ?F (37.1 ?C) (Oral)   Ht 6' (1.829 m)   Wt 204 lb (92.5 kg)   SpO2 99%   BMI 27.67 kg/m?  ?CONSTITUTIONAL: No acute distress, well nourished. ?HEENT:  Normocephalic, atraumatic,  extraocular motion intact. ?RESPIRATORY:  Lungs are clear, and breath sounds are equal bilaterally. Normal respiratory effort without pathologic use of accessory muscles. ?CARDIOVASCULAR: Heart is regular without murmurs, gallops, or rubs. ?GI: The abdomen is soft, non-distended, non-tender to palpation.  His midline incision is well healed without any hernia evidence.  His robotic port sites are also well healed.  His ostomy is pink and patent, without evidence of parastomal hernia. ?NEUROLOGIC:  Motor and sensation is grossly normal.  Cranial nerves are grossly intact. ?PSYCH:  Alert and oriented to person, place and time. Affect is normal. ? ?Labs/Imaging: ?Colonoscopy 11/11/21: ?IMPRESSION: ?- One 5 mm polyp in the cecum, removed with a cold snare. Resected and retrieved. ?- One 12 mm polyp in the ascending colon, removed piecemeal using a cold snare. Resected and retrieved. Clips ?were placed. ?- One 6 mm polyp in the ascending colon, removed with a cold snare. Resected and retrieved. ?- Five 5 to 7 mm polyps  in the transverse colon, removed with a cold snare. Resected and retrieved. ?- The examination was otherwise normal. ? ?Labs on 10/22/21: ?Na 138, K 3.6, Cl 102, CO2 25, BUN 12, Cr 0.83.  LFTs within normal.  WBC 11.7, Hgb 12, Hct 38.5, Plt 332. ? ?Assessment and Plan: ?This is a 56 y.o. male s/p Hartmann's procedure for perforated diverticulitis, now presenting to discuss colostomy reversal. ? ?--Discussed with him the plan for a minimally invasive robotic approach to his colostomy reversal.  Discussed with him the incisions, the surgery at length including the risks of bleeding, infection, injury to surrounding structures, possibility of drains, possibility of open surgery if there's too much scarring or inflammation, that Urology team would help [place] with cystoscopy and injection of ICG into bilateral ureters, hospital stay, recovery time, post-operative restrictions, and he's willing to proceed. ?--Will  schedule him for surgery on 02/10/22.  Will also send medical clearance to his PCP.  All questions have been answered. ? ?I spent 40 minutes dedicated to the care of this patient on the date of this encounter to includ

## 2022-01-20 ENCOUNTER — Other Ambulatory Visit: Payer: Self-pay

## 2022-01-20 ENCOUNTER — Other Ambulatory Visit: Payer: Self-pay | Admitting: Surgery

## 2022-01-21 ENCOUNTER — Telehealth: Payer: Self-pay | Admitting: Surgery

## 2022-01-21 NOTE — Telephone Encounter (Signed)
Patient has been advised of Pre-Admission date/time, COVID Testing date and Surgery date. ? ?Surgery Date: 02/10/22 ?Preadmission Testing Date: 02/02/22 (phone 1p-5p) ?Covid Testing Date: Not needed.   ? ?Patient has been made aware to call [phone], between 1-3:00pm the day before surgery, to find out what time to arrive for surgery.   ? ?

## 2022-02-02 ENCOUNTER — Encounter
Admission: RE | Admit: 2022-02-02 | Discharge: 2022-02-02 | Disposition: A | Discharge status: Home / Self Care | Payer: Medicaid Other | Source: Ambulatory Visit | Attending: Surgery | Admitting: Surgery

## 2022-02-02 HISTORY — DX: Diverticulitis of intestine, part unspecified, without perforation or abscess without bleeding: K57.92

## 2022-02-02 NOTE — Patient Instructions (Addendum)
Your procedure is scheduled on: Tuesday, April 25 ?Report to the Registration Desk on the 1st floor of the [facility]. ?To find out your arrival time, please call [phone] between 1PM - 3PM on: Monday, April 24 ? ?REMEMBER: ?Instructions that are not followed completely may result in serious medical risk, up to and including death; or upon the discretion of your surgeon and anesthesiologist your surgery may need to be rescheduled. ? ?Do not eat food after midnight the night before surgery.  ?No gum chewing, lozengers or hard candies. ? ?You may however, drink water up to 2 hours before you are scheduled to arrive for your surgery. Do not drink anything within 2 hours of your scheduled arrival time. ? ?TAKE THESE MEDICATIONS THE MORNING OF SURGERY WITH A SIP OF WATER: ? ?Amlodipine ?Omeprazole (Prilosec) - (take one the night before and one on the morning of surgery - helps to prevent nausea after surgery.) ? ?One week prior to surgery: starting April 18 ?Stop Anti-inflammatories (NSAIDS) such as Advil, Aleve, Ibuprofen, Motrin, Naproxen, Naprosyn and Aspirin based products such as Excedrin, Goodys Powder, BC Powder. ?Stop ANY OVER THE COUNTER supplements until after surgery. Stop multiple vitamins, melatonin ?You may however, continue to take Tylenol if needed for pain up until the day of surgery. ? ?No Alcohol for 24 hours before or after surgery. ? ?No Smoking including e-cigarettes for 24 hours prior to surgery.  ?No chewable tobacco products for at least 6 hours prior to surgery.  ?No nicotine patches on the day of surgery. ? ?Do not use any "recreational" drugs for at least a week prior to your surgery.  ?Please be advised that the combination of cocaine and anesthesia may have negative outcomes, up to and including death. ?If you test positive for cocaine, your surgery will be cancelled. ? ?On the morning of surgery brush your teeth with toothpaste and water, you may rinse your mouth with mouthwash if  you wish. ?Do not swallow any toothpaste or mouthwash. ? ?Use CHG Soap as directed on instruction sheet. ? ?Do not wear jewelry, make-up, hairpins, clips or nail polish. ? ?Do not wear lotions, powders, or perfumes.  ? ?Do not shave body from the neck down 48 hours prior to surgery just in case you cut yourself which could leave a site for infection.  ?Also, freshly shaved skin may become irritated if using the CHG soap. ? ?Contact lenses, hearing aids and dentures may not be worn into surgery. ? ?Do not bring valuables to the hospital. [facility] is not responsible for any missing/lost belongings or valuables.  ? ?Fleets enema or bowel prep as directed. ? ?Notify your doctor if there is any change in your medical condition (cold, fever, infection). ? ?Wear comfortable clothing (specific to your surgery type) to the hospital. ? ?After surgery, you can help prevent lung complications by doing breathing exercises.  ?Take deep breaths and cough every 1-2 hours. Your doctor may order a device called an Incentive Spirometer to help you take deep breaths. ?When coughing or sneezing, hold a pillow firmly against your incision with both hands. This is called ?splinting.? Doing this helps protect your incision. It also decreases belly discomfort. ? ?If you are being admitted to the hospital overnight, leave your suitcase in the car. ?After surgery it may be brought to your room. ? ?Please call the [facility] at [phone] if you have any questions about these instructions. ? ?Surgery Visitation Policy: ? ?Patients undergoing a surgery  or procedure may have two family members or support persons with them as long as the person is not COVID-19 positive or experiencing its symptoms.  ? ?Inpatient Visitation:   ? ?Visiting hours are 7 a.m. to 8 p.m. ?Up to four visitors are allowed at one time in a patient room, including children. The visitors may rotate out with other people during the day. One designated  support person (adult) may remain overnight.  ?

## 2022-02-03 NOTE — Progress Notes (Signed)
Call to verify patient has appt with Dr. Clide Deutscher at [facility] for medical clearance prior to surgery. Appointment needed changed (originally scheduled for 02/11/22) which is AFTER scheduled surgery. Appointment changed to 02/05/22 at 10:20 am. Patient and Dr. Hampton Abbot office informed.  ?

## 2022-02-06 NOTE — Pre-Procedure Instructions (Signed)
Medical clearance on chart from Dr Clide Deutscher from 02-05-22. Low Risk ?

## 2022-02-06 NOTE — Telephone Encounter (Signed)
Received medical clearance from [facility]. Pt's procedure risk is low and is optimized for surgery.  ?

## 2022-02-09 MED ORDER — BUPIVACAINE LIPOSOME 1.3 % IJ SUSP: 20.0000 mL | Freq: Once | INTRAMUSCULAR | Status: DC

## 2022-02-09 MED ORDER — ACETAMINOPHEN 500 MG PO TABS: 1000.0000 mg | ORAL_TABLET | ORAL | Status: AC

## 2022-02-09 MED ORDER — FLEET ENEMA 7-19 GM/118ML RE ENEM: 1.0000 | ENEMA | Freq: Once | RECTAL | Status: DC

## 2022-02-09 MED ORDER — CHLORHEXIDINE GLUCONATE 0.12 % MT SOLN: 15.0000 mL | Freq: Once | OROMUCOSAL | Status: AC

## 2022-02-09 MED ORDER — GABAPENTIN 300 MG PO CAPS: 300.0000 mg | ORAL_CAPSULE | ORAL | Status: AC

## 2022-02-09 MED ORDER — SODIUM CHLORIDE 0.9 % IV SOLN
2.0000 g | INTRAVENOUS | Status: AC
  Administered 2022-02-10: 2 g via INTRAVENOUS

## 2022-02-09 MED ORDER — ORAL CARE MOUTH RINSE: 15.0000 mL | Freq: Once | OROMUCOSAL | Status: AC

## 2022-02-09 MED ORDER — CHLORHEXIDINE GLUCONATE CLOTH 2 % EX PADS: 6.0000 | MEDICATED_PAD | Freq: Once | CUTANEOUS | Status: DC

## 2022-02-09 MED ORDER — ALVIMOPAN 12 MG PO CAPS: 12.0000 mg | ORAL_CAPSULE | ORAL | Status: AC

## 2022-02-09 MED ORDER — LACTATED RINGERS IV SOLN: INTRAVENOUS | Status: DC

## 2022-02-09 NOTE — Anesthesia Preprocedure Evaluation (Addendum)
Anesthesia Evaluation  ?Patient identified by MRN, date of birth, ID band ?Patient awake ? ? ? ?Reviewed: ?Allergy & Precautions, NPO status , Patient's Chart, lab work & pertinent test results ? ?History of Anesthesia Complications ?Negative for: history of anesthetic complications ? ?Airway ?Mallampati: II ? ?TM Distance: >3 FB ?Neck ROM: Full ? ? ? Dental ? ?(+) Poor Dentition, Missing, Chipped, Dental Advisory Given ?  ?Pulmonary ?shortness of breath and with exertion, neg sleep apnea, neg COPD, Patient abstained from smoking.Not current smoker,  ?History of Aspiration pneumonia of left lung due to vomit  ?  ?Pulmonary exam normal ?breath sounds clear to auscultation ? ? ? ? ? ? Cardiovascular ?Exercise Tolerance: Good ?METShypertension, Pt. on medications ?(-) CAD and (-) Past MI (-) dysrhythmias  ?Rhythm:Regular Rate:Normal ?- Systolic murmurs ? ?  ?Neuro/Psych ?PSYCHIATRIC DISORDERS Depression Chronic back pain with radiation to left leg and "left side and neck" ?  ? GI/Hepatic ?GERD  Medicated,(+)  ?  ? substance abuse ? marijuana use, colostomy in place ?Had a Hartmann's procedure on 08/08/21 for perforated diverticulitis.  He also is s/p robotic assisted cholecystectomy on 10/28/21 ?  ?Endo/Other  ?neg diabetes ? Renal/GU ?negative Renal ROS  ? ?  ?Musculoskeletal ? ?(+) narcotic dependent ? Abdominal ?Normal abdominal exam  (+)   ?Peds ? Hematology ?  ?Anesthesia Other Findings ?Pt reports recent use of fentanyl and suboxone. States he stopped fentanyl 3 weeks ago. States he uses '8mg'$  suboxone per day. ? ?Past Medical History: ?No date: Arthritis ?No date: Bulging lumbar disc ?No date: Chronic back pain ?No date: Depression ?No date: GERD (gastroesophageal reflux disease) ?No date: Hypertension ?No date: Kidney disease ? Reproductive/Obstetrics ? ?  ? ? ? ? ? ? ? ? ? ? ? ? ? ?  ?  ? ? ? ? ? ? ?Anesthesia Physical ? ?Anesthesia Plan ? ?ASA: 3 ? ?Anesthesia Plan: General   ? ?Post-op Pain Management: Toradol IV (intra-op)*, Ofirmev IV (intra-op)* and Regional block*  ? ?Induction: Intravenous ? ?PONV Risk Score and Plan: 3 and Ondansetron, Dexamethasone and Midazolam ? ?Airway Management Planned: Oral ETT ? ?Additional Equipment: None ? ?Intra-op Plan:  ? ?Post-operative Plan: Extubation in OR ? ?Informed Consent: I have reviewed the patients History and Physical, chart, labs and discussed the procedure including the risks, benefits and alternatives for the proposed anesthesia with the patient or authorized representative who has indicated his/her understanding and acceptance.  ? ? ? ?Dental advisory given ? ?Plan Discussed with: CRNA and Anesthesiologist ? ?Anesthesia Plan Comments: (Will await UDS to evaluate for other toxins.)  ? ? ? ? ?Anesthesia Quick Evaluation ? ?

## 2022-02-10 ENCOUNTER — Inpatient Hospital Stay
Admission: RE | Admit: 2022-02-10 | Discharge: 2022-02-13 | DRG: 346 | Disposition: A | Discharge status: Home / Self Care | Payer: Medicaid Other | Source: Ambulatory Visit | Attending: Surgery | Admitting: Surgery

## 2022-02-10 ENCOUNTER — Other Ambulatory Visit: Payer: Self-pay

## 2022-02-10 ENCOUNTER — Encounter: Payer: Self-pay | Admitting: Surgery

## 2022-02-10 ENCOUNTER — Inpatient Hospital Stay: Payer: Medicaid Other | Admitting: Anesthesiology

## 2022-02-10 ENCOUNTER — Encounter
Admission: RE | Disposition: A | Discharge status: Home / Self Care | Payer: Self-pay | Source: Ambulatory Visit | Attending: Surgery

## 2022-02-10 DIAGNOSIS — Z9049 Acquired absence of other specified parts of digestive tract: Secondary | ICD-10-CM | POA: Diagnosis not present

## 2022-02-10 DIAGNOSIS — M549 Dorsalgia, unspecified: Secondary | ICD-10-CM | POA: Diagnosis present

## 2022-02-10 DIAGNOSIS — F32A Depression, unspecified: Secondary | ICD-10-CM | POA: Diagnosis present

## 2022-02-10 DIAGNOSIS — I10 Essential (primary) hypertension: Secondary | ICD-10-CM | POA: Diagnosis present

## 2022-02-10 DIAGNOSIS — Z79899 Other long term (current) drug therapy: Secondary | ICD-10-CM | POA: Diagnosis not present

## 2022-02-10 DIAGNOSIS — K5720 Diverticulitis of large intestine with perforation and abscess without bleeding: Secondary | ICD-10-CM | POA: Diagnosis not present

## 2022-02-10 DIAGNOSIS — Z79891 Long term (current) use of opiate analgesic: Secondary | ICD-10-CM | POA: Diagnosis not present

## 2022-02-10 DIAGNOSIS — K219 Gastro-esophageal reflux disease without esophagitis: Secondary | ICD-10-CM | POA: Diagnosis present

## 2022-02-10 DIAGNOSIS — Z933 Colostomy status: Secondary | ICD-10-CM | POA: Diagnosis not present

## 2022-02-10 DIAGNOSIS — Z433 Encounter for attention to colostomy: Principal | ICD-10-CM

## 2022-02-10 DIAGNOSIS — G8929 Other chronic pain: Secondary | ICD-10-CM | POA: Diagnosis present

## 2022-02-10 DIAGNOSIS — K624 Stenosis of anus and rectum: Secondary | ICD-10-CM | POA: Diagnosis present

## 2022-02-10 DIAGNOSIS — M1990 Unspecified osteoarthritis, unspecified site: Secondary | ICD-10-CM | POA: Diagnosis present

## 2022-02-10 HISTORY — PX: XI ROBOTIC ASSISTED COLOSTOMY TAKEDOWN: SHX6828

## 2022-02-10 LAB — URINE DRUG SCREEN, QUALITATIVE (ARMC ONLY)
Amphetamines, Ur Screen: NOT DETECTED
Barbiturates, Ur Screen: NOT DETECTED
Benzodiazepine, Ur Scrn: NOT DETECTED
Cannabinoid 50 Ng, Ur ~~LOC~~: NOT DETECTED
Cocaine Metabolite,Ur ~~LOC~~: NOT DETECTED
MDMA (Ecstasy)Ur Screen: NOT DETECTED
Methadone Scn, Ur: NOT DETECTED
Opiate, Ur Screen: NOT DETECTED
Phencyclidine (PCP) Ur S: NOT DETECTED
Tricyclic, Ur Screen: NOT DETECTED

## 2022-02-10 LAB — GLUCOSE, CAPILLARY: Glucose-Capillary: 124 mg/dL — ABNORMAL HIGH (ref 70–99)

## 2022-02-10 SURGERY — CLOSURE, COLOSTOMY, ROBOT-ASSISTED
Anesthesia: General | Site: Ureter

## 2022-02-10 MED ORDER — PHENYLEPHRINE HCL (PRESSORS) 10 MG/ML IV SOLN
INTRAVENOUS | Status: AC
  Filled 2022-02-10: qty 1

## 2022-02-10 MED ORDER — LABETALOL HCL 5 MG/ML IV SOLN: 10.0000 mg | Freq: Once | INTRAVENOUS | Status: AC

## 2022-02-10 MED ORDER — SODIUM CHLORIDE FLUSH 0.9 % IV SOLN
INTRAVENOUS | Status: AC
  Filled 2022-02-10: qty 10

## 2022-02-10 MED ORDER — ENOXAPARIN SODIUM 40 MG/0.4ML IJ SOSY
40.0000 mg | PREFILLED_SYRINGE | INTRAMUSCULAR | Status: DC
  Administered 2022-02-11 – 2022-02-13 (×3): 40 mg via SUBCUTANEOUS
  Filled 2022-02-10 (×3): qty 0.4

## 2022-02-10 MED ORDER — PROMETHAZINE HCL 25 MG/ML IJ SOLN: 6.2500 mg | INTRAMUSCULAR | Status: DC | PRN

## 2022-02-10 MED ORDER — ACETAMINOPHEN 500 MG PO TABS
ORAL_TABLET | ORAL | Status: AC
  Administered 2022-02-10: 1000 mg via ORAL
  Filled 2022-02-10: qty 2

## 2022-02-10 MED ORDER — INDOCYANINE GREEN 25 MG IV SOLR
INTRAVENOUS | Status: DC | PRN
  Administered 2022-02-10: 2.5 mg via INTRAVENOUS

## 2022-02-10 MED ORDER — FENTANYL CITRATE (PF) 100 MCG/2ML IJ SOLN
INTRAMUSCULAR | Status: DC | PRN
  Administered 2022-02-10 (×4): 50 ug via INTRAVENOUS

## 2022-02-10 MED ORDER — KETAMINE HCL 10 MG/ML IJ SOLN
INTRAMUSCULAR | Status: DC | PRN
  Administered 2022-02-10: 20 mg via INTRAVENOUS
  Administered 2022-02-10 (×2): 30 mg via INTRAVENOUS
  Administered 2022-02-10: 20 mg via INTRAVENOUS

## 2022-02-10 MED ORDER — 0.9 % SODIUM CHLORIDE (POUR BTL) OPTIME
TOPICAL | Status: DC | PRN
  Administered 2022-02-10: 500 mL

## 2022-02-10 MED ORDER — KETOROLAC TROMETHAMINE 30 MG/ML IJ SOLN
30.0000 mg | Freq: Four times a day (QID) | INTRAMUSCULAR | Status: DC
  Administered 2022-02-10 – 2022-02-12 (×9): 30 mg via INTRAVENOUS
  Filled 2022-02-10 (×10): qty 1

## 2022-02-10 MED ORDER — DEXAMETHASONE SODIUM PHOSPHATE 10 MG/ML IJ SOLN
INTRAMUSCULAR | Status: AC
  Filled 2022-02-10: qty 1

## 2022-02-10 MED ORDER — GABAPENTIN 400 MG PO CAPS
800.0000 mg | ORAL_CAPSULE | Freq: Two times a day (BID) | ORAL | Status: DC
Start: 2022-02-10 — End: 2022-02-13
  Administered 2022-02-10 – 2022-02-13 (×6): 800 mg via ORAL
  Filled 2022-02-10 (×6): qty 2

## 2022-02-10 MED ORDER — KETOROLAC TROMETHAMINE 30 MG/ML IJ SOLN
INTRAMUSCULAR | Status: DC | PRN
  Administered 2022-02-10: 30 mg via INTRAVENOUS

## 2022-02-10 MED ORDER — OXYCODONE HCL 5 MG PO TABS
5.0000 mg | ORAL_TABLET | ORAL | Status: DC | PRN
  Administered 2022-02-10 – 2022-02-13 (×7): 10 mg via ORAL
  Filled 2022-02-10 (×7): qty 2
  Filled 2022-02-10: qty 1

## 2022-02-10 MED ORDER — AMLODIPINE BESYLATE 5 MG PO TABS
5.0000 mg | ORAL_TABLET | ORAL | Status: DC
  Administered 2022-02-11 – 2022-02-13 (×3): 5 mg via ORAL
  Filled 2022-02-10 (×3): qty 1

## 2022-02-10 MED ORDER — MIDAZOLAM HCL 2 MG/2ML IJ SOLN
INTRAMUSCULAR | Status: DC | PRN
  Administered 2022-02-10: 2 mg via INTRAVENOUS

## 2022-02-10 MED ORDER — ROCURONIUM BROMIDE 100 MG/10ML IV SOLN
INTRAVENOUS | Status: DC | PRN
  Administered 2022-02-10: 10 mg via INTRAVENOUS
  Administered 2022-02-10: 20 mg via INTRAVENOUS
  Administered 2022-02-10: 50 mg via INTRAVENOUS
  Administered 2022-02-10 (×2): 30 mg via INTRAVENOUS

## 2022-02-10 MED ORDER — SUCCINYLCHOLINE CHLORIDE 200 MG/10ML IV SOSY
PREFILLED_SYRINGE | INTRAVENOUS | Status: AC
  Filled 2022-02-10: qty 10

## 2022-02-10 MED ORDER — LIDOCAINE HCL (PF) 2 % IJ SOLN
INTRAMUSCULAR | Status: AC
  Filled 2022-02-10: qty 5

## 2022-02-10 MED ORDER — ONDANSETRON HCL 4 MG/2ML IJ SOLN: 4.0000 mg | Freq: Four times a day (QID) | INTRAMUSCULAR | Status: DC | PRN

## 2022-02-10 MED ORDER — BUPIVACAINE LIPOSOME 1.3 % IJ SUSP
INTRAMUSCULAR | Status: AC
  Filled 2022-02-10: qty 20

## 2022-02-10 MED ORDER — ONDANSETRON HCL 4 MG/2ML IJ SOLN
INTRAMUSCULAR | Status: AC
  Filled 2022-02-10: qty 2

## 2022-02-10 MED ORDER — LABETALOL HCL 5 MG/ML IV SOLN
INTRAVENOUS | Status: AC
  Administered 2022-02-10: 10 mg via INTRAVENOUS
  Filled 2022-02-10: qty 4

## 2022-02-10 MED ORDER — ONDANSETRON HCL 4 MG/2ML IJ SOLN
INTRAMUSCULAR | Status: DC | PRN
  Administered 2022-02-10: 4 mg via INTRAVENOUS

## 2022-02-10 MED ORDER — HYDROMORPHONE HCL 1 MG/ML IJ SOLN
INTRAMUSCULAR | Status: AC
  Administered 2022-02-10: 0.5 mg via INTRAVENOUS
  Filled 2022-02-10: qty 1

## 2022-02-10 MED ORDER — ACETAMINOPHEN 500 MG PO TABS
1000.0000 mg | ORAL_TABLET | Freq: Four times a day (QID) | ORAL | Status: DC
  Administered 2022-02-10 – 2022-02-13 (×9): 1000 mg via ORAL
  Filled 2022-02-10 (×12): qty 2

## 2022-02-10 MED ORDER — ALLOPURINOL 100 MG PO TABS
300.0000 mg | ORAL_TABLET | ORAL | Status: DC
  Administered 2022-02-11 – 2022-02-13 (×3): 300 mg via ORAL
  Filled 2022-02-10 (×3): qty 3

## 2022-02-10 MED ORDER — SUCCINYLCHOLINE CHLORIDE 200 MG/10ML IV SOSY
PREFILLED_SYRINGE | INTRAVENOUS | Status: DC | PRN
  Administered 2022-02-10: 140 mg via INTRAVENOUS

## 2022-02-10 MED ORDER — GLYCOPYRROLATE 0.2 MG/ML IJ SOLN
INTRAMUSCULAR | Status: AC
  Filled 2022-02-10: qty 1

## 2022-02-10 MED ORDER — POLYETHYLENE GLYCOL 3350 17 G PO PACK: 17.0000 g | PACK | Freq: Every day | ORAL | Status: DC | PRN

## 2022-02-10 MED ORDER — FENTANYL CITRATE (PF) 100 MCG/2ML IJ SOLN
INTRAMUSCULAR | Status: AC
Start: 2022-02-10 — End: ?
  Filled 2022-02-10: qty 2

## 2022-02-10 MED ORDER — HYDROMORPHONE HCL 1 MG/ML IJ SOLN
0.5000 mg | INTRAMUSCULAR | Status: DC | PRN
  Administered 2022-02-10: 0.5 mg via INTRAVENOUS
  Filled 2022-02-10: qty 0.5

## 2022-02-10 MED ORDER — MIDAZOLAM HCL 2 MG/2ML IJ SOLN
INTRAMUSCULAR | Status: AC
  Filled 2022-02-10: qty 2

## 2022-02-10 MED ORDER — PROPOFOL 10 MG/ML IV BOLUS
INTRAVENOUS | Status: DC | PRN
  Administered 2022-02-10: 170 mg via INTRAVENOUS

## 2022-02-10 MED ORDER — SODIUM CHLORIDE (PF) 0.9 % IJ SOLN
INTRAMUSCULAR | Status: DC | PRN
  Administered 2022-02-10: 60 mL via INTRAMUSCULAR

## 2022-02-10 MED ORDER — ALVIMOPAN 12 MG PO CAPS
ORAL_CAPSULE | ORAL | Status: AC
  Administered 2022-02-10: 12 mg via ORAL
  Filled 2022-02-10: qty 1

## 2022-02-10 MED ORDER — KETOROLAC TROMETHAMINE 30 MG/ML IJ SOLN
INTRAMUSCULAR | Status: AC
  Filled 2022-02-10: qty 1

## 2022-02-10 MED ORDER — ALVIMOPAN 12 MG PO CAPS
12.0000 mg | ORAL_CAPSULE | Freq: Two times a day (BID) | ORAL | Status: DC
Start: 2022-02-11 — End: 2022-02-12
  Administered 2022-02-11 (×2): 12 mg via ORAL
  Filled 2022-02-10 (×3): qty 1

## 2022-02-10 MED ORDER — PANTOPRAZOLE SODIUM 40 MG IV SOLR
40.0000 mg | Freq: Every day | INTRAVENOUS | Status: DC
  Administered 2022-02-10 – 2022-02-12 (×3): 40 mg via INTRAVENOUS
  Filled 2022-02-10 (×3): qty 10

## 2022-02-10 MED ORDER — ONDANSETRON 4 MG PO TBDP
4.0000 mg | ORAL_TABLET | Freq: Four times a day (QID) | ORAL | Status: DC | PRN
  Administered 2022-02-10: 4 mg via ORAL
  Filled 2022-02-10: qty 1

## 2022-02-10 MED ORDER — DEXAMETHASONE SODIUM PHOSPHATE 10 MG/ML IJ SOLN
INTRAMUSCULAR | Status: DC | PRN
Start: 2022-02-10 — End: 2022-02-10
  Administered 2022-02-10: 10 mg via INTRAVENOUS

## 2022-02-10 MED ORDER — KETAMINE HCL 50 MG/5ML IJ SOSY
PREFILLED_SYRINGE | INTRAMUSCULAR | Status: AC
  Filled 2022-02-10: qty 5

## 2022-02-10 MED ORDER — GABAPENTIN 300 MG PO CAPS
ORAL_CAPSULE | ORAL | Status: AC
  Administered 2022-02-10: 300 mg via ORAL
  Filled 2022-02-10: qty 1

## 2022-02-10 MED ORDER — SUGAMMADEX SODIUM 200 MG/2ML IV SOLN
INTRAVENOUS | Status: DC | PRN
  Administered 2022-02-10: 200 mg via INTRAVENOUS

## 2022-02-10 MED ORDER — INDOCYANINE GREEN 25 MG IV SOLR
INTRAVENOUS | Status: DC | PRN
  Administered 2022-02-10 (×2): 25 mg

## 2022-02-10 MED ORDER — ACETAMINOPHEN 10 MG/ML IV SOLN: 1000.0000 mg | Freq: Once | INTRAVENOUS | Status: DC | PRN

## 2022-02-10 MED ORDER — SODIUM CHLORIDE 0.9 % IR SOLN
Status: DC | PRN
  Administered 2022-02-10: 1000 mL via INTRAVESICAL
  Administered 2022-02-10: 1000 mL

## 2022-02-10 MED ORDER — DROPERIDOL 2.5 MG/ML IJ SOLN: 0.6250 mg | Freq: Once | INTRAMUSCULAR | Status: DC | PRN

## 2022-02-10 MED ORDER — MELATONIN 5 MG PO TABS: 5.0000 mg | ORAL_TABLET | Freq: Every evening | ORAL | Status: DC | PRN

## 2022-02-10 MED ORDER — SODIUM CHLORIDE 0.9 % IV SOLN
2.0000 g | Freq: Three times a day (TID) | INTRAVENOUS | Status: AC
  Administered 2022-02-10 – 2022-02-11 (×3): 2 g via INTRAVENOUS
  Filled 2022-02-10 (×3): qty 2

## 2022-02-10 MED ORDER — CHLORHEXIDINE GLUCONATE 0.12 % MT SOLN
OROMUCOSAL | Status: AC
  Administered 2022-02-10: 15 mL via OROMUCOSAL
  Filled 2022-02-10: qty 15

## 2022-02-10 MED ORDER — GLYCOPYRROLATE 0.2 MG/ML IJ SOLN
INTRAMUSCULAR | Status: DC | PRN
Start: 2022-02-10 — End: 2022-02-10
  Administered 2022-02-10: .2 mg via INTRAVENOUS

## 2022-02-10 MED ORDER — BUPIVACAINE-EPINEPHRINE (PF) 0.25% -1:200000 IJ SOLN
INTRAMUSCULAR | Status: AC
Start: 2022-02-10 — End: ?
  Filled 2022-02-10: qty 30

## 2022-02-10 MED ORDER — ROCURONIUM BROMIDE 10 MG/ML (PF) SYRINGE
PREFILLED_SYRINGE | INTRAVENOUS | Status: AC
  Filled 2022-02-10: qty 10

## 2022-02-10 MED ORDER — FENTANYL CITRATE (PF) 100 MCG/2ML IJ SOLN
INTRAMUSCULAR | Status: AC
  Filled 2022-02-10: qty 2

## 2022-02-10 MED ORDER — SODIUM CHLORIDE 0.9 % IV SOLN
INTRAVENOUS | Status: AC
  Filled 2022-02-10: qty 2

## 2022-02-10 MED ORDER — PHENYLEPHRINE HCL-NACL 20-0.9 MG/250ML-% IV SOLN
INTRAVENOUS | Status: DC | PRN
  Administered 2022-02-10: 45 ug/min via INTRAVENOUS

## 2022-02-10 MED ORDER — LIDOCAINE HCL (CARDIAC) PF 100 MG/5ML IV SOSY
PREFILLED_SYRINGE | INTRAVENOUS | Status: DC | PRN
  Administered 2022-02-10: 100 mg via INTRAVENOUS

## 2022-02-10 MED ORDER — PROPOFOL 10 MG/ML IV BOLUS
INTRAVENOUS | Status: AC
  Filled 2022-02-10: qty 40

## 2022-02-10 MED ORDER — HYDROMORPHONE HCL 1 MG/ML IJ SOLN
0.5000 mg | INTRAMUSCULAR | Status: DC | PRN
  Administered 2022-02-10: 0.5 mg via INTRAVENOUS

## 2022-02-10 MED ORDER — LACTATED RINGERS IV SOLN
INTRAVENOUS | Status: DC
Start: 2022-02-10 — End: 2022-02-12

## 2022-02-10 SURGICAL SUPPLY — 102 items
"PENCIL ELECTRO HAND CTR " (MISCELLANEOUS) ×2 IMPLANT
BAG DRAIN CYSTO-URO LG1000N (MISCELLANEOUS) ×3 IMPLANT
BAG URINE DRAIN 2000ML AR STRL (UROLOGICAL SUPPLIES) ×1 IMPLANT
CANNULA REDUC XI 12-8 STAPL (CANNULA) ×1
CANNULA REDUCER 12-8 DVNC XI (CANNULA) ×2 IMPLANT
CATH FOLEY 2WAY  5CC 16FR (CATHETERS) ×1
CATH ROBINSON RED 14FR (CATHETERS) IMPLANT
CATH URET ROBINSON RED 14FR (CATHETERS) ×1
CATH URETL 5X70 OPEN END (CATHETERS) ×1 IMPLANT
CATH URETL OPEN 5X70 (CATHETERS) ×3 IMPLANT
CATH URTH 16FR FL 2W BLN LF (CATHETERS) ×2 IMPLANT
CHLORAPREP W/TINT 26 (MISCELLANEOUS) ×4 IMPLANT
COVER MAYO STAND REUSABLE (DRAPES) ×1 IMPLANT
COVER TIP SHEARS 8 DVNC (MISCELLANEOUS) IMPLANT
COVER TIP SHEARS 8MM DA VINCI (MISCELLANEOUS) ×1
DEFOGGER SCOPE WARMER CLEARIFY (MISCELLANEOUS) ×1 IMPLANT
DERMABOND ADVANCED (GAUZE/BANDAGES/DRESSINGS) ×1
DERMABOND ADVANCED .7 DNX12 (GAUZE/BANDAGES/DRESSINGS) ×2 IMPLANT
DRAIN PENROSE 12X.25 LTX STRL (MISCELLANEOUS) ×1 IMPLANT
DRAPE ARM DVNC X/XI (DISPOSABLE) ×8 IMPLANT
DRAPE COLUMN DVNC XI (DISPOSABLE) ×2 IMPLANT
DRAPE DA VINCI XI ARM (DISPOSABLE) ×4
DRAPE DA VINCI XI COLUMN (DISPOSABLE) ×1
DRAPE LEGGINS SURG 28X43 STRL (DRAPES) ×3 IMPLANT
DRAPE UNDER BUTTOCK W/FLU (DRAPES) ×3 IMPLANT
DRSG OPSITE POSTOP 3X4 (GAUZE/BANDAGES/DRESSINGS) ×4 IMPLANT
DRSG OPSITE POSTOP 4X6 (GAUZE/BANDAGES/DRESSINGS) ×1 IMPLANT
ELECT CAUTERY BLADE TIP 2.5 (TIP) ×3
ELECT EZSTD 165MM 6.5IN (MISCELLANEOUS) ×3
ELECT REM PT RETURN 9FT ADLT (ELECTROSURGICAL) ×3
ELECTRODE CAUTERY BLDE TIP 2.5 (TIP) ×2 IMPLANT
ELECTRODE EZSTD 165MM 6.5IN (MISCELLANEOUS) ×2 IMPLANT
ELECTRODE REM PT RTRN 9FT ADLT (ELECTROSURGICAL) ×2 IMPLANT
GAUZE 4X4 16PLY ~~LOC~~+RFID DBL (SPONGE) ×6 IMPLANT
GLOVE SURG SYN 6.5 ES PF (GLOVE) ×3 IMPLANT
GLOVE SURG SYN 6.5 PF PI (GLOVE) ×2 IMPLANT
GLOVE SURG SYN 7.0 (GLOVE) ×9 IMPLANT
GLOVE SURG SYN 7.0 PF PI (GLOVE) ×6 IMPLANT
GLOVE SURG SYN 7.5  E (GLOVE) ×3
GLOVE SURG SYN 7.5 E (GLOVE) ×6 IMPLANT
GLOVE SURG SYN 7.5 PF PI (GLOVE) ×6 IMPLANT
GOWN STRL REUS W/ TWL LRG LVL3 (GOWN DISPOSABLE) ×12 IMPLANT
GOWN STRL REUS W/TWL LRG LVL3 (GOWN DISPOSABLE) ×6
GRASPER SUT TROCAR 14GX15 (MISCELLANEOUS) ×1 IMPLANT
HANDLE YANKAUER SUCT BULB TIP (MISCELLANEOUS) ×3 IMPLANT
IRRIGATION STRYKERFLOW (MISCELLANEOUS) IMPLANT
IRRIGATOR STRYKERFLOW (MISCELLANEOUS) ×3
IV NS 1000ML (IV SOLUTION) ×2
IV NS 1000ML BAXH (IV SOLUTION) ×2 IMPLANT
KIT IMAGING PINPOINTPAQ (MISCELLANEOUS) ×6 IMPLANT
KIT PINK PAD W/HEAD ARE REST (MISCELLANEOUS) ×3
KIT PINK PAD W/HEAD ARM REST (MISCELLANEOUS) ×2 IMPLANT
MANIFOLD NEPTUNE II (INSTRUMENTS) ×6 IMPLANT
NDL HYPO 25X1 1.5 SAFETY (NEEDLE) IMPLANT
NDL INSUFFLATION 14GA 120MM (NEEDLE) ×2 IMPLANT
NDL SAFETY ECLIPSE 18X1.5 (NEEDLE) IMPLANT
NEEDLE HYPO 18GX1.5 SHARP (NEEDLE) ×1
NEEDLE HYPO 22GX1.5 SAFETY (NEEDLE) ×3 IMPLANT
NEEDLE HYPO 25X1 1.5 SAFETY (NEEDLE) ×3 IMPLANT
NEEDLE INSUFFLATION 14GA 120MM (NEEDLE) ×3 IMPLANT
NS IRRIG 500ML POUR BTL (IV SOLUTION) ×4 IMPLANT
OBTURATOR OPTICAL STANDARD 8MM (TROCAR)
OBTURATOR OPTICAL STND 8 DVNC (TROCAR)
OBTURATOR OPTICALSTD 8 DVNC (TROCAR) IMPLANT
PACK COLON CLEAN CLOSURE (MISCELLANEOUS) ×1 IMPLANT
PACK CYSTO AR (MISCELLANEOUS) ×3 IMPLANT
PACK LAP CHOLECYSTECTOMY (MISCELLANEOUS) ×3 IMPLANT
PENCIL ELECTRO HAND CTR (MISCELLANEOUS) ×3 IMPLANT
RELOAD STAPLE 45 3.5 BLU DVNC (STAPLE) IMPLANT
RELOAD STAPLER 3.5X45 BLU DVNC (STAPLE) ×4 IMPLANT
SEAL CANN UNIV 5-8 DVNC XI (MISCELLANEOUS) ×6 IMPLANT
SEAL XI 5MM-8MM UNIVERSAL (MISCELLANEOUS) ×3
SEALER VESSEL DA VINCI XI (MISCELLANEOUS) ×1
SEALER VESSEL EXT DVNC XI (MISCELLANEOUS) IMPLANT
SET CYSTO W/LG BORE CLAMP LF (SET/KITS/TRAYS/PACK) ×3 IMPLANT
SOL PREP PVP 2OZ (MISCELLANEOUS) ×3
SOLUTION PREP PVP 2OZ (MISCELLANEOUS) ×2 IMPLANT
SPIKE FLUID TRANSFER (MISCELLANEOUS) ×3 IMPLANT
SPONGE T-LAP 18X18 ~~LOC~~+RFID (SPONGE) ×7 IMPLANT
SPONGE T-LAP 4X18 ~~LOC~~+RFID (SPONGE) IMPLANT
STAPLER 45 DA VINCI SURE FORM (STAPLE) ×1
STAPLER 45 SUREFORM DVNC (STAPLE) IMPLANT
STAPLER CANNULA SEAL DVNC XI (STAPLE) ×2 IMPLANT
STAPLER CANNULA SEAL XI (STAPLE) ×1
STAPLER CIRCULAR MANUAL XL 25 (STAPLE) ×1 IMPLANT
STAPLER RELOAD 3.5X45 BLU DVNC (STAPLE) ×4
STAPLER RELOAD 3.5X45 BLUE (STAPLE) ×2
SURGILUBE 2OZ TUBE FLIPTOP (MISCELLANEOUS) ×7 IMPLANT
SUT DVC VLOC 3-0 CL 6 P-12 (SUTURE) IMPLANT
SUT PDS AB 1 CT1 36 (SUTURE) ×2 IMPLANT
SUT PROLENE 2 0 SH DA (SUTURE) ×2 IMPLANT
SUT VIC AB 3-0 SH 27 (SUTURE) ×1
SUT VIC AB 3-0 SH 27X BRD (SUTURE) IMPLANT
SUT VICRYL 0 AB UR-6 (SUTURE) ×1 IMPLANT
SYR 10ML LL (SYRINGE) ×6 IMPLANT
SYR 30ML LL (SYRINGE) ×2 IMPLANT
SYR TOOMEY IRRIG 70ML (MISCELLANEOUS) ×3
SYRINGE TOOMEY IRRIG 70ML (MISCELLANEOUS) IMPLANT
SYS TROCAR 1.5-3 SLV ABD GEL (ENDOMECHANICALS) ×3
SYSTEM TROCR 1.5-3 SLV ABD GEL (ENDOMECHANICALS) IMPLANT
TRAY FOLEY MTR SLVR 16FR STAT (SET/KITS/TRAYS/PACK) ×3 IMPLANT
TUBING EVAC SMOKE HEATED PNEUM (TUBING) ×3 IMPLANT

## 2022-02-10 NOTE — Transfer of Care (Signed)
Immediate Anesthesia Transfer of Care Note ? ?Patient: Gregory Oconnell ? ?Procedure(s) Performed: XI ROBOTIC ASSISTED COLOSTOMY TAKEDOWN with Otho Ket, PA-C to assist (Abdomen) ?CYSTOSCOPY WITH ICG (Bilateral: Ureter) ? ?Patient Location: PACU ? ?Anesthesia Type:General ? ?Level of Consciousness: drowsy ? ?Airway & Oxygen Therapy: Patient Spontanous Breathing and Patient connected to face mask oxygen ? ?Post-op Assessment: Report given to RN and Post -op Vital signs reviewed and stable ? ?Post vital signs: Reviewed and stable ? ?Last Vitals:  ?Vitals Value Taken Time  ?BP 144/89 02/10/22 1336  ?Temp    ?Pulse 94 02/10/22 1338  ?Resp 15 02/10/22 1338  ?SpO2 97 % 02/10/22 1338  ?Vitals shown include unvalidated device data. ? ?Last Pain:  ?Vitals:  ? 02/10/22 0640  ?TempSrc: Temporal  ?PainSc: 6   ?   ? ?  ? ?Complications: No notable events documented. ?

## 2022-02-10 NOTE — Anesthesia Procedure Notes (Signed)
Procedure Name: Intubation ?Date/Time: 02/10/2022 8:22 AM ?Performed by: Fredderick Phenix, CRNA ?Pre-anesthesia Checklist: Patient identified, Emergency Drugs available, Suction available and Patient being monitored ?Patient Re-evaluated:Patient Re-evaluated prior to induction ?Oxygen Delivery Method: Circle system utilized ?Preoxygenation: Pre-oxygenation with 100% oxygen ?Induction Type: IV induction ?Ventilation: Mask ventilation without difficulty ?Laryngoscope Size: Mac and 4 ?Grade View: Grade I ?Tube type: Oral ?Tube size: 7.5 mm ?Number of attempts: 1 ?Airway Equipment and Method: Stylet and Oral airway ?Placement Confirmation: ETT inserted through vocal cords under direct vision, positive ETCO2 and breath sounds checked- equal and bilateral ?Secured at: 22 cm ?Tube secured with: Tape ?Dental Injury: Teeth and Oropharynx as per pre-operative assessment  ? ? ? ? ?

## 2022-02-10 NOTE — Op Note (Addendum)
Procedure Date:  02/10/2022 ? 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 ?Procedure:   ?Robotic assisted Colostomy Reversal with colorectal anastomosis ?Partial proctectomy for rectal stricture ?ICG perfusion check of anastomosis ? ?Surgeon:  Melvyn Neth, MD ? ?Assistant:  Edison Simon, PA-C, Lavone Neri, PA-S ? ?Anesthesia:  General endotracheal ? ?Estimated Blood Loss:  75 ml ? ?Specimens: ?Proximal rectum ? ?Complications:  None ? ?Findings:  The patient was found to have an area of narrowing in the proximal rectum while trying to pass the 25 mm Hegar dilator which would not allow the dilator to reach the end of the rectal stump.  As a precaution, this area was resected. ? ?Indications for Procedure:  This is a 56 y.o. male with prior Hartmann's procedure for perforated diverticulitis, and now presents for colostomy takedown and anastomosis.  Colonoscopy did not reveal any other issues that would preclude surgery. The risks of bleeding, infection, bowel injury, need for diverting ostomy, and need for further procedures were all discussed with the patient and he was willing to proceed. ?  ?Description of Procedure: ?The patient was correctly identified in the preoperative area and brought into the operating room.  The patient was placed supine with VTE prophylaxis in place.  Appropriate time-outs were performed.  Anesthesia was induced and the patient was intubated.  Appropriate antibiotics were infused.  The patient was then placed in lithotomy position. ?  ?Dr. Erlene Quan then proceeded with a cystoscopy and injection of ICG into bilateral ureters.  Please see Dr. Cherrie Gauze op note for further details. ?  ?The end colostomy was sutured closed with a 2-0 Silk suture.  The abdomen was prepped and draped in a sterile fashion. We started with dissection of the stoma by dissecting around the mucocutaneous junction and mobilizing  the ostomy from the subcutaneous tissue and the fascia.  This was done using blunt dissection and cautery.  We then placed a wound protector into the ostomy site in order to prevent any infection of the wound.  We then resected the distal 1 cm of the end colostomy in order to get clean edges.  We placed dilators through the open end as well and decided that a 25 mm size EEA stapler anvil would be appropriate.  We created a pursestring using 2-0 Prolene suture.  The anvil was secured with no complications.  The end colostomy with the anvil was then placed back into the abdomen.  The gelport was secured to the wound protector and pneumoperitoneum was started. ? ?An 8 mm port was placed through the gelport and using camera visualization, three 8 mm ports and a 12 mm port were placed between the upper midline to the right lower quadrant.  The DaVinci platform was docked, camera targeted, and instruments placed under direct visualization.  We started then with mobilization of adhesions between bowel loops and the descending colon to the abdominal wall.  The small bowel was mobilized off the pelvis to reveal the rectal stump.  The descending colon was pulled to the pelvis and found to have great reach without any tension.  We then mobilized the proximal rectum after finding the prolene marking sutures that had been previously placed.  At that point, Mr. Olean Ree scrubbed in and started placing the 25 mm Hegar dilator.  He had a difficult time passing the dilator past the mid rectum, and it was noted that this portion of the rectum had an  area of narrowing or stricture, which would not allow the dilator to reach the end of the rectal stump.  Despite some mobilization of the mid rectum, off the anterior peritoneal reflection, the area of narrowing persisted.  Given this, it was decided to resect the proximal rectum in order to prevent any complications with the colorectal anastomosis.  The proximal rectum was then further  mobilized using the Vessel sealer to dissect the lateral peritoneal walls, the anterior reflection, and the mesorectum.  Dissection moved distally beyond the narrowing and tear, and then the rectum was transected using two loads of a 45 mm blue load stapler.  Prior to stapling, 2.5 mg of ICG was injected and FireFly was used to verify great blood flow to the mid rectum at the stapler site as well as the distal colon at the anvil site.  Stapler was fired and the proximal rectum placed aside.  Mr. Olean Ree went back with the 25 mm dilator and had more ease to pass through.  This was followed by the EEA stapler.  Spike was deployed, connected to the anvil making sure the colon was straight, and stapler was fired.  Insufflation test under saline was then done by Mr. Olean Ree as well without any bubbles noted.  The pelvis was the irrigated thoroughly. ? ?A laparoscopic grasper was passed through the gelport and secured to the proximal rectum specimen.  Then, the [place] was undocked and instruments removed.  We then proceeded with our clean closure.  The 12 mm port in the right lower quadrant was closed using trans-fascial 0 Vicryl suture and PMI.    The wound protector was removed and the specimen retrieved and sent to pathology.  60 mL total of the Exparel solution was infused to the fascia and subcutaneous tissue of the ostomy site and around the incisions. The fascia of the ostomy site was closed using #1 running PDS suture x2.  Wound cavity was irrigated.  Mr. Olean Ree then proceeded to close in the incisions.  A 1/4 inch penrose drain was placed in the ostomy site cavity, and 3-0 Vicryl was used to approximate the ostomy incision.  The skin was closed with staples, securing the drain to the skin with staples as well.  The remaining incisions were closed with staples as well.  All the wounds were dressed with honeycomb dressings. ?  ?Foley catheter was kept in place.  The patient was then placed in supine flat  position, emerged from anesthesia and extubated and brought to the recovery room for further management. ?  ?The patient tolerated the procedure well and all counts were correct at the end of the case. ? ?  ?Melvyn Neth, MD ?  ?   ?

## 2022-02-10 NOTE — Interval H&P Note (Signed)
Regular rate and rhythm ?Clear to auscultation bilaterally ? ?Urology asked to assist with ureteral identification form of ICG injection.  We discussed the procedure and all questions were answered.  Risk primarily include urinary tract infection and damage to surrounding structures.  He was agreeable to the procedure.

## 2022-02-10 NOTE — Plan of Care (Signed)

## 2022-02-10 NOTE — Op Note (Signed)
Date of procedure: 02/10/22 ? ?Preoperative diagnosis:  ?Perforated diverticulitis ? ?Postoperative diagnosis:  ?Same as above ? ?Procedure: ?Cystoscopy ?Bilateral retrograde ureteral instillation of ICG for the purpose of ureteral identification ? ?Surgeon: Hollice Espy, MD ? ?Anesthesia: General ? ?Complications: None ? ?Intraoperative findings: Unremarkable bladder. ? ?EBL: Minimal ? ?Specimens: None ? ?Drains: 74 French Foley catheter ? ?Indication: Gregory Oconnell is a 56 y.o. patient with personal history of perforated diverticulitis status post end colostomy now undergoing robotic colostomy takedown with Dr. Hampton Abbot.  Urology was asked to instill ICG for the purpose of intraoperative ureteral identification.  .  After reviewing the management options for treatment, he elected to proceed with the above surgical procedure(s). We have discussed the potential benefits and risks of the procedure, side effects of the proposed treatment, the likelihood of the patient achieving the goals of the procedure, and any potential problems that might occur during the procedure or recuperation. Informed consent has been obtained. ? ?Description of procedure: ? ?The patient was taken to the operating room and general anesthesia was induced.  The patient was placed in the dorsal lithotomy position, prepped and draped in the usual sterile fashion, and preoperative antibiotics were administered. A preoperative time-out was performed.  ? ?A 21 [place] scope was advanced per urethra into the bladder.  The bladder was grossly unremarkable.  Attention was turned to the left ureteral orifice.  Was cannulated using a 5 [place] open-ended ureteral catheter which was able to be advanced several centimeters which went easily.  I then injected 10 mL of ICG slowly retracting the ureteral catheter along the way.  The same exact procedure was performed on the right slowly retracting the 5 [place] open-ended ureteral catheter injecting  another 10 mL of ICG along the way.  There is E flux of green material from both UOs following placement.  I then removed the scope and placed a 16 French Foley catheter inflating it with 10 cc of sterile water.  The patient was then reprepped and draped by Dr. Hampton Abbot, please see his note for additional details. ? ?Plan: Foley to be removed per primary team. ? ?Hollice Espy, M.D. ? ?

## 2022-02-10 NOTE — Interval H&P Note (Signed)
History and Physical Interval Note: ? ?02/10/2022 ?7:07 AM ? ?[name]  has presented today for surgery, with the diagnosis of colostomy in place.  The various methods of treatment have been discussed with the patient and family. After consideration of risks, benefits and other options for treatment, the patient has consented to  Procedure(s): ?XI ROBOTIC ASSISTED COLOSTOMY TAKEDOWN with Otho Ket, PA-C to assist (N/A) ?CYSTOSCOPY WITH ICG (N/A) as a surgical intervention.  The patient's history has been reviewed, patient examined, no change in status, stable for surgery.  I have reviewed the patient's chart and labs.  Questions were answered to the patient's satisfaction.   ? ? ?Gregory Oconnell ? ? ?

## 2022-02-10 NOTE — Brief Op Note (Signed)
02/10/2022 ? ?1:27 PM ? ?PATIENT:  Gregory Oconnell  56 y.o. male ? ?PRE-OPERATIVE DIAGNOSIS:  colostomy in place ? ?POST-OPERATIVE DIAGNOSIS:  colostomy in place ? ?PROCEDURE:  Procedure(s): ?XI ROBOTIC ASSISTED COLOSTOMY TAKEDOWN with Otho Ket, PA-C to assist (N/A) ?CYSTOSCOPY WITH ICG (Bilateral) ? ?SURGEON:  Surgeon(s) and Role: ?Panel 1: ?   * Olean Ree, MD - Primary ?Panel 2: ?   Hollice Espy, MD - Primary ? ?PHYSICIAN ASSISTANT: Edison Simon, PA-C ? ?ASSISTANTS: Lavone Neri, PA-S  ? ?ANESTHESIA:   general ? ?EBL:  75 mL  ? ?BLOOD ADMINISTERED:none ? ?DRAINS: Penrose drain in the prior ostomy site.   ? ?LOCAL MEDICATIONS USED:  BUPIVICAINE  ? ?SPECIMEN:  Source of Specimen:  proximal rectum ? ?DISPOSITION OF SPECIMEN:  PATHOLOGY ? ?COUNTS:  YES ? ?DICTATION: .Dragon Dictation ? ?PLAN OF CARE: Admit to inpatient  ? ?PATIENT DISPOSITION:  PACU - hemodynamically stable. ?  ?Delay start of Pharmacological VTE agent (>24hrs) due to surgical blood loss or risk of bleeding: yes ? ?

## 2022-02-11 ENCOUNTER — Encounter: Payer: Self-pay | Admitting: Surgery

## 2022-02-11 LAB — CBC
HCT: 37.5 % — ABNORMAL LOW (ref 39.0–52.0)
Hemoglobin: 12.3 g/dL — ABNORMAL LOW (ref 13.0–17.0)
MCH: 27.1 pg (ref 26.0–34.0)
MCHC: 32.8 g/dL (ref 30.0–36.0)
MCV: 82.6 fL (ref 80.0–100.0)
Platelets: 286 10*3/uL (ref 150–400)
RBC: 4.54 MIL/uL (ref 4.22–5.81)
RDW: 14.6 % (ref 11.5–15.5)
WBC: 17.2 10*3/uL — ABNORMAL HIGH (ref 4.0–10.5)
nRBC: 0 % (ref 0.0–0.2)

## 2022-02-11 LAB — MAGNESIUM: Magnesium: 2.1 mg/dL (ref 1.7–2.4)

## 2022-02-11 LAB — BASIC METABOLIC PANEL
Anion gap: 7 (ref 5–15)
BUN: 14 mg/dL (ref 6–20)
CO2: 27 mmol/L (ref 22–32)
Calcium: 8.9 mg/dL (ref 8.9–10.3)
Chloride: 104 mmol/L (ref 98–111)
Creatinine, Ser: 1.01 mg/dL (ref 0.61–1.24)
GFR, Estimated: >60 mL/min (ref >60)
Glucose, Bld: 149 mg/dL — ABNORMAL HIGH (ref 70–99)
Potassium: 3.6 mmol/L (ref 3.5–5.1)
Sodium: 138 mmol/L (ref 135–145)

## 2022-02-11 LAB — SURGICAL PATHOLOGY

## 2022-02-11 MED ORDER — CHLORHEXIDINE GLUCONATE CLOTH 2 % EX PADS: 6.0000 | MEDICATED_PAD | Freq: Every day | CUTANEOUS | Status: DC

## 2022-02-11 NOTE — Progress Notes (Signed)
[facility] ?SURGICAL PROGRESS NOTE ? ?Hospital Day(s): 1.  ? ?Post op day(s): 1 Day Post-Op.  ? ?Interval History:  ?Patient seen and examined ?No acute events or new complaints overnight.  ?Patient reports he is having significant abdominal/incisional soreness ?No fever, chills, nausea, emesis ?He does have bump in leukocytosis; 17.2K; likely reactive from OR ?Hgb stable at 12.3 ?Renal function normal; sCr - 1.01 (slight bump from baseline); UO - 1820 ccs ?No significant electrolyte derangements  ?He is NPO this morning ? ?Vital signs in last 24 hours: [min-max] current  ?Temp:  [97.7 ?F (36.5 ?C)-98.5 ?F (36.9 ?C)] 98.5 ?F (36.9 ?C) (04/26 0457) ?Pulse Rate:  [67-103] 67 (04/26 0457) ?Resp:  [9-20] 17 (04/26 0457) ?BP: (142-170)/(88-107) 142/92 (04/26 0457) ?SpO2:  [93 %-100 %] 97 % (04/26 0457)     Height: 6' (182.9 cm) Weight: 90.7 kg BMI (Calculated): 27.12  ? ?Intake/Output last 2 shifts:  ?04/25 0701 - 04/26 0700 ?In: 3177.6 [I.V.:2877.6; IV Piggyback:300] ?Out: 2095 [Urine:1820; Blood:75]  ? ?Physical Exam:  ?Constitutional: alert, cooperative and no distress, he does appear mildly tremulous  ?Respiratory: breathing non-labored at rest  ?Cardiovascular: regular rate and sinus rhythm  ?Gastrointestinal: Soft, incisional tenderness, and non-distended, no rebound/guarding ?Genitourinary: Foley in place  ?Integumentary: Laparoscopic incisions are intact with staples, there is drainage on honeycomb at previous colostomy site  ? ?Labs:  ? ?  Latest Ref Rng & Units 02/11/2022  ?  3:14 AM 10/22/2021  ?  1:46 PM 08/29/2021  ?  5:52 AM  ?CBC  ?WBC 4.0 - 10.5 K/uL 17.2   11.7   7.7    ?Hemoglobin 13.0 - 17.0 g/dL 12.3   12.0   8.6    ?Hematocrit 39.0 - 52.0 % 37.5   38.5   27.1    ?Platelets 150 - 400 K/uL 286   332   346    ? ? ?  Latest Ref Rng & Units 02/11/2022  ?  3:14 AM 10/22/2021  ?  1:46 PM 08/30/2021  ?  4:16 AM  ?CMP  ?Glucose 70 - 99 mg/dL 149   133   101    ?BUN 6 - 20 mg/dL '14   12   8     '$ ?Creatinine 0.61 - 1.24 mg/dL 1.01   0.83   0.58    ?Sodium 135 - 145 mmol/L 138   138   136    ?Potassium 3.5 - 5.1 mmol/L 3.6   3.6   3.5    ?Chloride 98 - 111 mmol/L 104   102   104    ?CO2 22 - 32 mmol/L '27   25   26    '$ ?Calcium 8.9 - 10.3 mg/dL 8.9   8.8   8.1    ?Total Protein 6.5 - 8.1 g/dL  7.4   5.9    ?Total Bilirubin 0.3 - 1.2 mg/dL  0.8   1.3    ?Alkaline Phos 38 - 126 U/L  98   322    ?AST 15 - 41 U/L  19   126    ?ALT 0 - 44 U/L  15   148    ? ? ? ?Imaging studies: No new pertinent imaging studies ? ? ?Assessment/Plan: ?56 y.o. male  1 Day Post-Op s/p robotic assisted laparoscopic colostomy takedown. ? ? - Okay for CLD ?- Okay to discontinue foley catheter today  ? - Continue IVF resuscitation today   ? - Continue entereg until bowel function  returns ? - Complete perioperative Abx ?- Monitor abdominal examination; on-going bowel function ?- Pain control prn; Psych consult pending given chronic pain issues ?- antiemetics prn ?- Monitor leukocytosis; CBC in AM ?- Mobilization encouraged   ? ?All of the above findings and recommendations were discussed with the patient, and the medical team, and all of patient's questions were answered to his expressed satisfaction. ? ?-- ?Edison Simon, PA-C ?[facility] ?02/11/2022, 7:29 AM ?M-F: 7am - 4pm ? ?

## 2022-02-11 NOTE — Progress Notes (Signed)
Mobility Specialist - Progress Note ? ? ? 02/11/22 1400  ?Mobility  ?Activity Ambulated independently in hallway;Stood at bedside;Transferred from bed to chair;Dangled on edge of bed  ?Level of Assistance Standby assist, set-up cues, supervision of patient - no hands on  ?Assistive Device Front wheel walker  ?Distance Ambulated (ft) 180 ft  ?Activity Response Tolerated fair  ?$Mobility charge 1 Mobility  ? ? ?Pre-mobility: 80 HR, 98% SpO2 ?During mobility: 67 HR, 98% SpO2 ? ?Pt supine upon arrival using RA. Pt completes bed mobility to sit EOB with ModI and STS indep. Completes ambulation w/ SUPERVISION --- voices complaints of 9/10 back and neck pain. Pt is left in recliner with needs in reach. ? ?Gregory Oconnell ?Mobility Specialist ?02/11/22, 2:06 PM ? ? ? ? ?

## 2022-02-11 NOTE — TOC Initial Note (Signed)
Transition of Care (TOC) - Initial/Assessment Note  ? ? ?Patient Details  ?Name: Gregory Oconnell ?MRN: [id number] ?Date of Birth: Nov 17, 1965 ? ?Transition of Care (TOC) CM/SW Contact:    ?Beverly Sessions, RN ?Phone Number: ?02/11/2022, 8:47 AM ? ?Clinical Narrative:                 ? ? ?Transition of Care (TOC) Screening Note ? ? ?Patient Details  ?Name: Gregory Oconnell ?Date of Birth: 27-Nov-1965 ? ? ?Transition of Care (TOC) CM/SW Contact:    ?Beverly Sessions, RN ?Phone Number: ?02/11/2022, 8:49 AM ? ? ? ?Transition of Care Department [facility]) has reviewed patient and no TOC needs have been identified at this time. We will continue to monitor patient advancement through interdisciplinary progression rounds. If new patient transition needs arise, please place a TOC consult. ? ? ?  ?  ? ? ?Patient Goals and CMS Choice ?  ?  ?  ? ?Expected Discharge Plan and Services ?  ?  ?  ?  ?  ?                ?  ?  ?  ?  ?  ?  ?  ?  ?  ?  ? ?Prior Living Arrangements/Services ?  ?  ?  ?       ?  ?  ?  ?  ? ?Activities of Daily Living ?Home Assistive Devices/Equipment: None ?ADL Screening (condition at time of admission) ?Patient's cognitive ability adequate to safely complete daily activities?: Yes ?Is the patient deaf or have difficulty hearing?: No ?Does the patient have difficulty seeing, even when wearing glasses/contacts?: No ?Does the patient have difficulty concentrating, remembering, or making decisions?: No ?Patient able to express need for assistance with ADLs?: Yes ?Does the patient have difficulty dressing or bathing?: No ?Independently performs ADLs?: Yes (appropriate for developmental age) ?Does the patient have difficulty walking or climbing stairs?: No ?Weakness of Legs: None ?Weakness of Arms/Hands: None ? ?Permission Sought/Granted ?  ?  ?   ?   ?   ?   ? ?Emotional Assessment ?  ?  ?  ?  ?  ?  ? ?Admission diagnosis:  Colostomy in place [facility]) [Z93.3] ?Patient Active Problem List  ? Diagnosis Date Noted  ?  Colostomy in place [facility]) 02/10/2022  ? Calculus of gallbladder with acute cholecystitis without obstruction   ? Major depressive disorder, recurrent episode, moderate ([place]) 08/29/2021  ? Elevated LFTs   ? Cholecystitis 08/28/2021  ? Aspiration pneumonia of left lung due to vomit ([place])   ? Diverticulitis of large intestine with perforation and abscess without bleeding 08/08/2021  ? ?PCP:  Donnie Coffin, MD ?Pharmacy:   ?[facility] [place] ?[address]) [place] [address] ?Phone: [phone] Fax: [phone] ? ?[place], [place] ?[address] ?[place] [address] ?Phone: [phone] Fax: [phone] ? ? ? ? ?Social Determinants of Health (SDOH) Interventions ?  ? ?Readmission Risk Interventions ?   ? View : No data to display.  ?  ?  ?  ? ? ? ?

## 2022-02-12 DIAGNOSIS — Z933 Colostomy status: Secondary | ICD-10-CM

## 2022-02-12 NOTE — Progress Notes (Signed)
Mobility Specialist - Progress Note ? ? 02/12/22 1200  ?Mobility  ?Activity Ambulated independently in hallway;Stood at bedside;Transferred from bed to chair;Dangled on edge of bed  ?Level of Assistance Standby assist, set-up cues, supervision of patient - no hands on  ?Assistive Device None  ?Distance Ambulated (ft) 400 ft  ?Activity Response Tolerated well  ?$Mobility charge 1 Mobility  ? ? ? ?During mobility: 68 HR, 96% SpO2 ? ?Pt supine upon arrival using RA and completes bed mobility and STS ModI. Completes ambulation with supervision and returns to recliner with needs in reach. ? ?Gregory Oconnell ?Mobility Specialist ?02/12/22, 12:18 PM ? ? ? ? ? ?

## 2022-02-12 NOTE — Anesthesia Postprocedure Evaluation (Signed)
Anesthesia Post Note ? ?Patient: Gregory Oconnell ? ?Procedure(s) Performed: XI ROBOTIC ASSISTED COLOSTOMY TAKEDOWN with Otho Ket, PA-C to assist (Abdomen) ?CYSTOSCOPY WITH ICG (Bilateral: Ureter) ? ?Patient location during evaluation: PACU ?Anesthesia Type: General ?Level of consciousness: awake and alert ?Pain management: pain level controlled ?Vital Signs Assessment: post-procedure vital signs reviewed and stable ?Respiratory status: spontaneous breathing, nonlabored ventilation, respiratory function stable and patient connected to nasal cannula oxygen ?Cardiovascular status: blood pressure returned to baseline and stable ?Postop Assessment: no apparent nausea or vomiting ?Anesthetic complications: no ? ? ?No notable events documented. ? ? ?Last Vitals:  ?Vitals:  ? 02/12/22 0713 02/12/22 1025  ?BP: (!) 154/98 (!) 155/97  ?Pulse: 72   ?Resp: 20   ?Temp: 36.7 ?C   ?SpO2: 97%   ?  ?Last Pain:  ?Vitals:  ? 02/12/22 1000  ?TempSrc:   ?PainSc: 4   ? ? ?  ?  ?  ?  ?  ?  ? ?Molli Barrows ? ? ? ? ?
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
## 2022-02-12 NOTE — Progress Notes (Signed)
[facility] ?SURGICAL PROGRESS NOTE ? ?Hospital Day(s): 2.  ? ?Post op day(s): 2 Days Post-Op.  ? ?Interval History:  ?Patient seen and examined ?No acute events or new complaints overnight.  ?Patient reports abdominal pain improved compared to yesterday ?No fever, chills, nausea, emesis ?No new labs this morning ?He has been on CLD; tolerating well ?Has had ROBF ? ?Vital signs in last 24 hours: [min-max] current  ?Temp:  [98 ?F (36.7 ?C)-98.6 ?F (37 ?C)] 98 ?F (36.7 ?C) (04/27 4481) ?Pulse Rate:  [55-76] 72 (04/27 0713) ?Resp:  [15-20] 20 (04/27 0713) ?BP: (143-173)/(86-98) 154/98 (04/27 8563) ?SpO2:  [97 %-100 %] 97 % (04/27 0713)     Height: 6' (182.9 cm) Weight: 90.7 kg BMI (Calculated): 27.12  ? ?Intake/Output last 2 shifts:  ?04/26 0701 - 04/27 0700 ?In: 979.9 [P.O.:120; I.V.:859.9] ?Out: 2790 [Urine:2790]  ? ?Physical Exam:  ?Constitutional: alert, cooperative and no distress  ?Respiratory: breathing non-labored at rest  ?Cardiovascular: regular rate and sinus rhythm  ?Gastrointestinal: Soft, incisional tenderness, and non-distended, no rebound/guarding ?Integumentary: Laparoscopic incisions are intact with staples, dressings removed, no erythema or drainage ? ?Labs:  ? ?  Latest Ref Rng & Units 02/11/2022  ?  3:14 AM 10/22/2021  ?  1:46 PM 08/29/2021  ?  5:52 AM  ?CBC  ?WBC 4.0 - 10.5 K/uL 17.2   11.7   7.7    ?Hemoglobin 13.0 - 17.0 g/dL 12.3   12.0   8.6    ?Hematocrit 39.0 - 52.0 % 37.5   38.5   27.1    ?Platelets 150 - 400 K/uL 286   332   346    ? ? ?  Latest Ref Rng & Units 02/11/2022  ?  3:14 AM 10/22/2021  ?  1:46 PM 08/30/2021  ?  4:16 AM  ?CMP  ?Glucose 70 - 99 mg/dL 149   133   101    ?BUN 6 - 20 mg/dL '14   12   8    '$ ?Creatinine 0.61 - 1.24 mg/dL 1.01   0.83   0.58    ?Sodium 135 - 145 mmol/L 138   138   136    ?Potassium 3.5 - 5.1 mmol/L 3.6   3.6   3.5    ?Chloride 98 - 111 mmol/L 104   102   104    ?CO2 22 - 32 mmol/L '27   25   26    '$ ?Calcium 8.9 - 10.3 mg/dL 8.9   8.8   8.1     ?Total Protein 6.5 - 8.1 g/dL  7.4   5.9    ?Total Bilirubin 0.3 - 1.2 mg/dL  0.8   1.3    ?Alkaline Phos 38 - 126 U/L  98   322    ?AST 15 - 41 U/L  19   126    ?ALT 0 - 44 U/L  15   148    ? ? ? ?Imaging studies: No new pertinent imaging studies ? ? ?Assessment/Plan: ?56 y.o. male  2 Days Post-Op s/p robotic assisted laparoscopic colostomy takedown. ? ? - Advance to full liquid diet ? - Discontinue IVF ? - Discontinue Entereg ?- Monitor abdominal examination; on-going bowel function ?- Pain control prn; Psych consult pending given chronic pain issues ?- Antiemetics prn ?- Monitor leukocytosis; CBC in AM ?- Mobilization encouraged   ? ? - Discharge Planning: Pending diet advancement, otherwise doing well. May be able to DC in 24 hours. Will need  follow up in 7-10 for penrose and staple removal.  ? ?All of the above findings and recommendations were discussed with the patient, and the medical team, and all of patient's questions were answered to his expressed satisfaction. ? ?-- ?Edison Simon, PA-C ?[facility] ?02/12/2022, 7:28 AM ?M-F: 7am - 4pm ? ?

## 2022-02-13 ENCOUNTER — Telehealth: Payer: Self-pay

## 2022-02-13 LAB — CBC
HCT: 36.9 % — ABNORMAL LOW (ref 39.0–52.0)
Hemoglobin: 11.8 g/dL — ABNORMAL LOW (ref 13.0–17.0)
MCH: 26.9 pg (ref 26.0–34.0)
MCHC: 32 g/dL (ref 30.0–36.0)
MCV: 84.2 fL (ref 80.0–100.0)
Platelets: 254 10*3/uL (ref 150–400)
RBC: 4.38 MIL/uL (ref 4.22–5.81)
RDW: 14.5 % (ref 11.5–15.5)
WBC: 11.4 10*3/uL — ABNORMAL HIGH (ref 4.0–10.5)
nRBC: 0 % (ref 0.0–0.2)

## 2022-02-13 LAB — BASIC METABOLIC PANEL
Anion gap: 3 — ABNORMAL LOW (ref 5–15)
BUN: 10 mg/dL (ref 6–20)
CO2: 28 mmol/L (ref 22–32)
Calcium: 8.8 mg/dL — ABNORMAL LOW (ref 8.9–10.3)
Chloride: 108 mmol/L (ref 98–111)
Creatinine, Ser: 0.79 mg/dL (ref 0.61–1.24)
GFR, Estimated: >60 mL/min (ref >60)
Glucose, Bld: 100 mg/dL — ABNORMAL HIGH (ref 70–99)
Potassium: 3.8 mmol/L (ref 3.5–5.1)
Sodium: 139 mmol/L (ref 135–145)

## 2022-02-13 MED ORDER — OXYCODONE HCL 5 MG PO TABS: 5.0000 mg | ORAL_TABLET | Freq: Four times a day (QID) | ORAL | 0 refills | Status: DC | PRN

## 2022-02-13 MED ORDER — IBUPROFEN 600 MG PO TABS: 600.0000 mg | ORAL_TABLET | Freq: Four times a day (QID) | ORAL | 0 refills | Status: AC | PRN

## 2022-02-13 NOTE — Telephone Encounter (Signed)
PA submitted via CMM for Oxycodone 5 mg. Waiting for a reply. ?

## 2022-02-13 NOTE — Discharge Instructions (Addendum)
In addition to included general post-operative instructions, ? ?Diet: Resume home diet.  ? ?Activity: No heavy lifting >20 pounds (children, pets, laundry, garbage) for 6 weeks, but light activity and walking are encouraged. Do not drive or drink alcohol if taking narcotic pain medications or having pain that might distract from driving. ? ?Wound care: You may shower/get incision wet with soapy water and pat dry (do not rub incisions), but no baths or submerging incision underwater until follow-up. Follow up on 05/04 for staple removal  ? ?Medications: Resume all home medications. For mild to moderate pain: acetaminophen (Tylenol) or ibuprofen/naproxen (if no kidney disease). Combining Tylenol with alcohol can substantially increase your risk of causing liver disease. Narcotic pain medications, if prescribed, can be used for severe pain, though may cause nausea, constipation, and drowsiness. Do not combine Tylenol and Percocet (or similar) within a 6 hour period as Percocet (and similar) contain(s) Tylenol. If you do not need the narcotic pain medication, you do not need to fill the prescription. ? ?Call office [phone] / [phone]) at any time if any questions, worsening pain, fevers/chills, bleeding, drainage from incision site, or other concerns. ? ?

## 2022-02-13 NOTE — Discharge Summary (Signed)
[place] [facility] ?SURGICAL DISCHARGE SUMMARY ? ?Patient ID: ?Gregory Oconnell ?MRN: [id number] ?DOB/AGE: 1966-04-21 56 y.o. ? ?Admit date: 02/10/2022 ?Discharge date: 02/13/2022 ? ?Discharge Diagnoses ?Patient Active Problem List  ? Diagnosis Date Noted  ? Colostomy in place [facility]) 02/10/2022  ? ? ?Consultants ?Psychiatry  ? ?Procedures ?02/10/2022:  ?Robotic assisted laparoscopic colostomy takedown ? ?HPI: Gregory Oconnell is a 56 y.o. male with a history of Hartman's Procedure for perforated diverticulitis who presents for colostomy takedown on 04/25 ? ?Hospital Course: Informed consent was obtained and documented, and patient underwent uneventful Robotic assisted laparoscopic colostomy takedown (Dr Hampton Abbot, 02/10/2022).  Post-operatively, patient did well. Advancement of patient's diet and ambulation were well-tolerated. The remainder of patient's hospital course was essentially unremarkable, and discharge planning was initiated accordingly with patient safely able to be discharged home with appropriate discharge instructions, pain control, and outpatient follow-up after all of his questions were answered to his expressed satisfaction.  ? ?Please note, on presentation to the hospital patient noted that he has been using fentanyl and suboxone "off the street" for chronic back pain. He does have an appointment with the pain clinic on 05/04. Psychiatry was consulted for assistance in prescribing suboxone; however, they felt it would be more prudent to prescribe typical post-op pain management medications to allow him to get to his pain management appointment on 05/04. We appreciate their assistance.  ? ?Discharge Condition: Good ? ? ?Physical Examination:  ?Constitutional: alert, cooperative and no distress  ?Respiratory: breathing non-labored at rest  ?Cardiovascular: regular rate and sinus rhythm  ?Gastrointestinal: Soft, incisional tenderness, and non-distended, no rebound/guarding ?Integumentary:  Laparoscopic incisions are intact with staples, dressings removed, no erythema or drainage ? ? ?Allergies as of 02/13/2022   ?No Known Allergies ?  ? ?  ?Medication List  ?  ? ?TAKE these medications   ? ?acetaminophen 500 MG tablet ?Commonly known as: TYLENOL ?Take 2 tablets (1,000 mg total) by mouth every 6 (six) hours as needed for mild pain. ?  ?allopurinol 300 MG tablet ?Commonly known as: ZYLOPRIM ?Take 300 mg by mouth every morning. ?  ?amLODipine 5 MG tablet ?Commonly known as: NORVASC ?Take 5 mg by mouth every morning. ?  ?docusate sodium 100 MG capsule ?Commonly known as: COLACE ?Take 100 mg by mouth 2 (two) times daily. ?  ?gabapentin 800 MG tablet ?Commonly known as: NEURONTIN ?Take 800 mg by mouth 2 (two) times daily. ?  ?ibuprofen 600 MG tablet ?Commonly known as: ADVIL ?Take 1 tablet (600 mg total) by mouth every 6 (six) hours as needed. ?  ?MELATONIN PO ?Take 1 tablet by mouth at bedtime as needed (sleep). ?  ?methocarbamol 500 MG tablet ?Commonly known as: ROBAXIN ?Take 1,000 mg by mouth every 6 (six) hours as needed. ?  ?multivitamin with minerals Tabs tablet ?Take 1 tablet by mouth daily. ?  ?omeprazole 40 MG capsule ?Commonly known as: PRILOSEC ?Take 40 mg by mouth daily. ?  ?ondansetron 4 MG disintegrating tablet ?Commonly known as: ZOFRAN-ODT ?Take 4 mg by mouth 3 (three) times daily as needed for nausea/vomiting. ?  ?oxyCODONE 5 MG immediate release tablet ?Commonly known as: Oxy IR/ROXICODONE ?Take 1 tablet (5 mg total) by mouth every 6 (six) hours as needed for severe pain or breakthrough pain. ?  ?promethazine 12.5 MG tablet ?Commonly known as: PHENERGAN ?Take 1 tablet (12.5 mg total) by mouth every 6 (six) hours as needed for nausea or vomiting. ?  ?triamcinolone cream 0.1 % ?Commonly known as: KENALOG ?Apply  1 application topically 2 (two) times daily as needed (rash). ?  ? ?  ? ?  ?  ? ? ?  ?Discharge Care Instructions  ?(From admission, onward)  ?  ? ? ?  ? ?  Start     Ordered  ?  02/13/22 0000  Discharge wound care:       ?Comments: Cover previous colostomy site with dry gauze; okay to remove to shower; change daily  ? 02/13/22 0811  ? ?  ?  ? ?  ? ? ? ? Follow-up Information   ? ? Tylene Fantasia, PA-C. Schedule an appointment as soon as possible for a visit on 02/19/2022.   ?Specialty: Physician Assistant ?Why: s/p colostomy takedown, has staples....PLEASE double book Zach for 130 PM on this day ?Contact information: ?[address] ?[address] ?[place] [address] ?[phone] ? ? ?  ?  ? ?  ?  ? ?  ? ? ? ?Time spent on discharge management including discussion of hospital course, clinical condition, outpatient instructions, prescriptions, and follow up with the patient and members of the medical team: >30 minutes ? ?-- ?Edison Simon , PA-C ?[facility]  ?02/13/2022, 8:40 AM ?[phone] ?M-F: 7am - 4pm ? ?

## 2022-02-16 ENCOUNTER — Ambulatory Visit: Payer: Medicaid Other | Admitting: Surgery

## 2022-02-19 ENCOUNTER — Encounter: Payer: Self-pay | Admitting: Physician Assistant

## 2022-02-19 ENCOUNTER — Ambulatory Visit (INDEPENDENT_AMBULATORY_CARE_PROVIDER_SITE_OTHER): Payer: Medicaid Other | Admitting: Physician Assistant

## 2022-02-19 ENCOUNTER — Other Ambulatory Visit: Payer: Self-pay

## 2022-02-19 DIAGNOSIS — Z09 Encounter for follow-up examination after completed treatment for conditions other than malignant neoplasm: Secondary | ICD-10-CM

## 2022-02-19 DIAGNOSIS — Z933 Colostomy status: Secondary | ICD-10-CM

## 2022-02-19 NOTE — Progress Notes (Signed)
[facility] ?POST-OP OFFICE VISIT ? ?02/19/2022 ? ?HPI: ?Gregory Oconnell is a 56 y.o. male 9 days s/p robotic assisted laparoscopic colostomy takedown with Dr Hampton Abbot. ? ?He is doing well; complains of swelling near previous ostomy site; otherwise most of pain is from staples ?No fever, chills, nausea, emesis ?He reports a strong appetite and normal bowel function  ?No other complaints ? ?Of note, he did have his pain management appointment today.  ? ?Physical Exam: ?Constitutional: Well appearing male, NAD ?Abdomen: Soft, non-tender, non-distended, no rebound/guarding ?Skin: Laparoscopic incisions are healing well, no erythema or drainage, staples removed. Previous colostomy site is CDI with staples and penrose drain (removed), no erythema or drainage. There is expected levels of induration, no evidence of underlying infection/seroma/hematoma. Some ecchymosis.   ? ?Assessment/Plan: ?This is a 56 y.o. male 9 days s/p robotic assisted laparoscopic colostomy takedown ? ? - Removed staples and penrose drain; placed steri-strips ? - Pain control prn; He is following with pain management ? - Reviewed wound care recommendation ? - Reviewed lifting restrictions; 6 weeks total ? - We will see him again in ~2 weeks  ? ?-- ?Edison Simon, PA-C ?[facility] ?02/19/2022, 2:03 PM ?M-F: 7am - 4pm ? ?

## 2022-02-19 NOTE — Patient Instructions (Signed)

## 2022-03-04 IMAGING — XA IR CHOLANGIOGRAM VIA EXIST CATHETER
1 series · 13 of 13 positions shown · non-contrast
Comparison: none

INDICATION: Status post percutaneous cholecystostomy tube placement on
08/28/2021. Further assessment now performed with cholangiogram to
determine patency of the cystic duct and common bile duct prior to
planned cholecystectomy later in [REDACTED].

[Series 4: interv standard · 4 acquisitions, 13 frames shown]
[im 1/4]
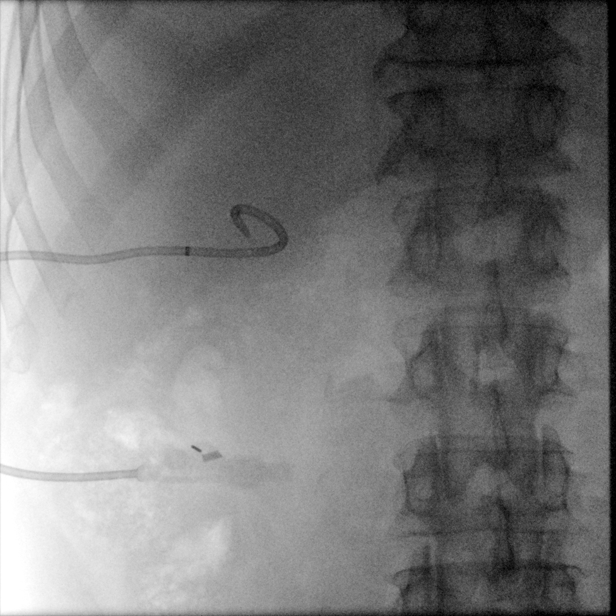
[im 2/4]
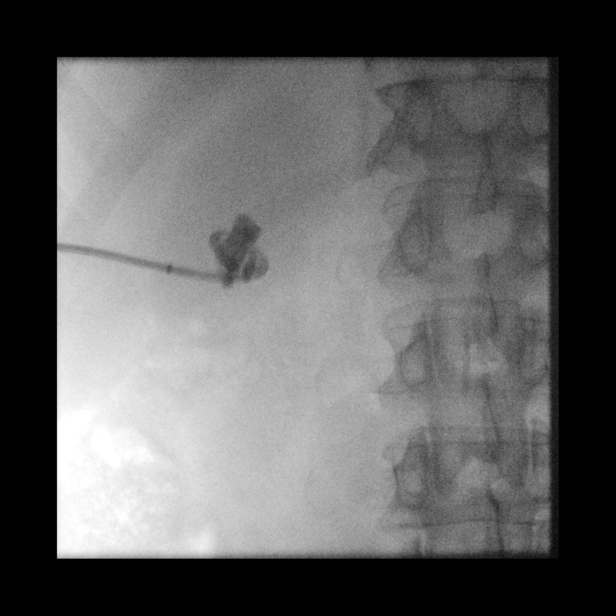
[im 2/4]
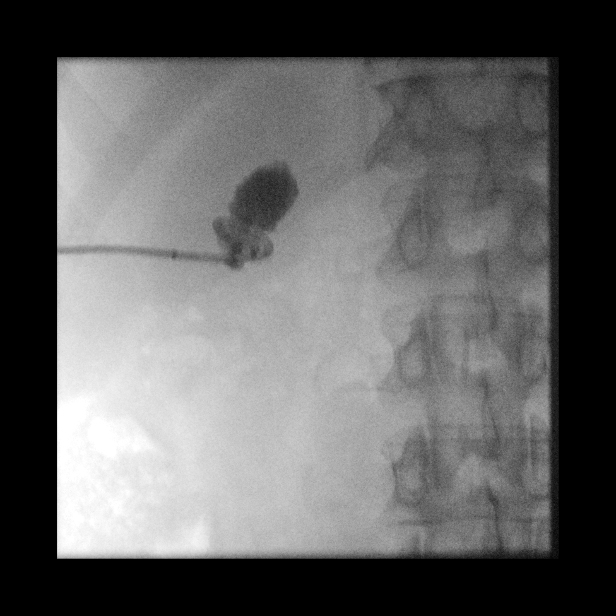
[im 2/4]
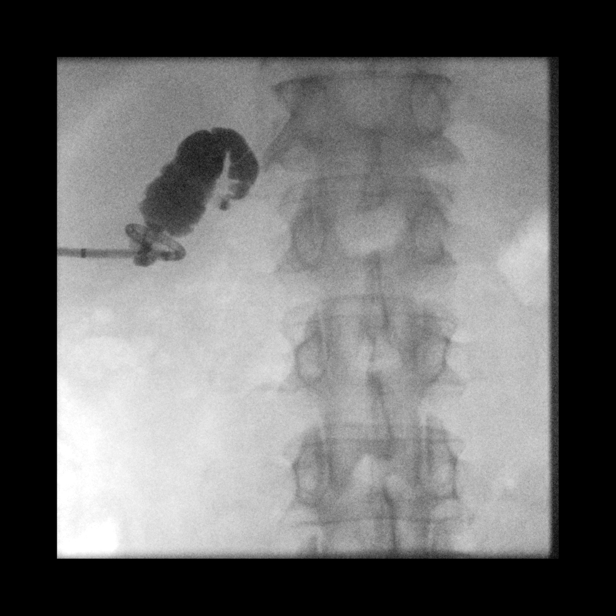
[im 2/4]
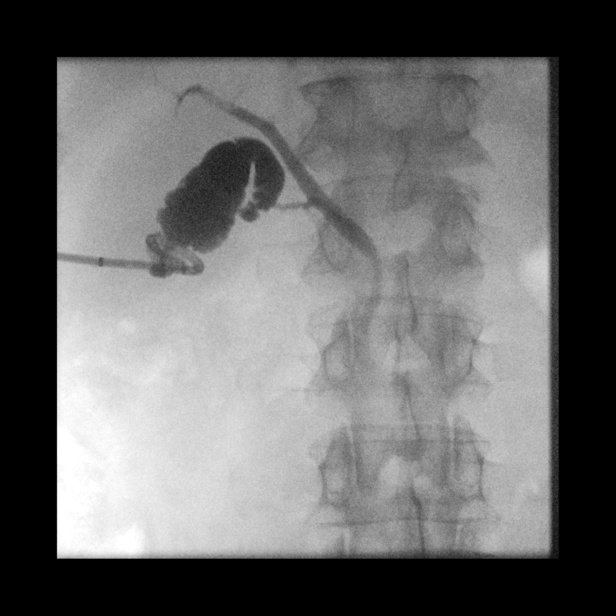
[im 3/4]
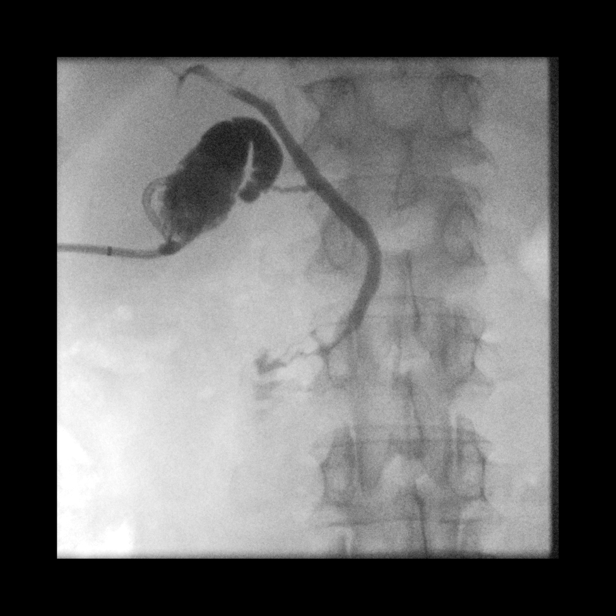
[im 3/4]
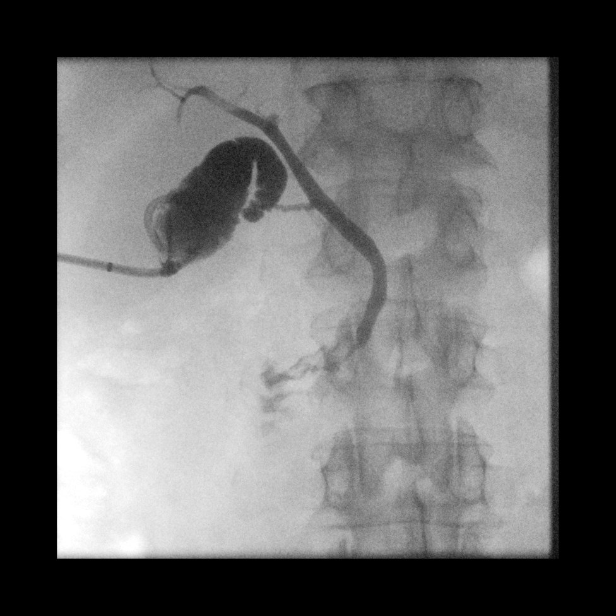
[im 3/4]
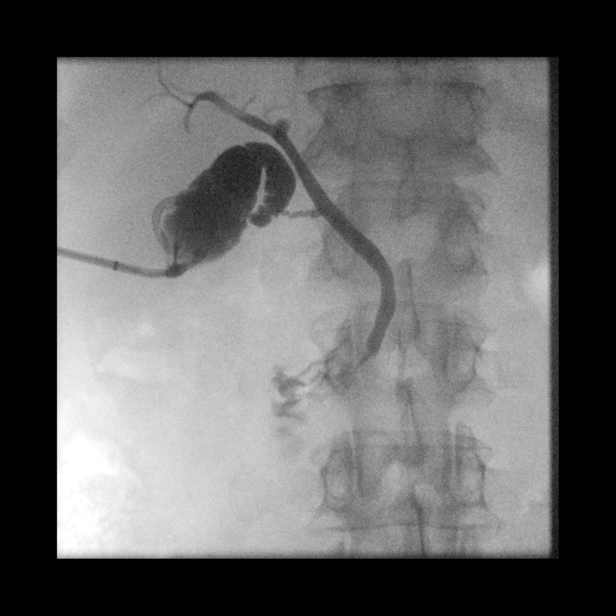
[im 3/4]
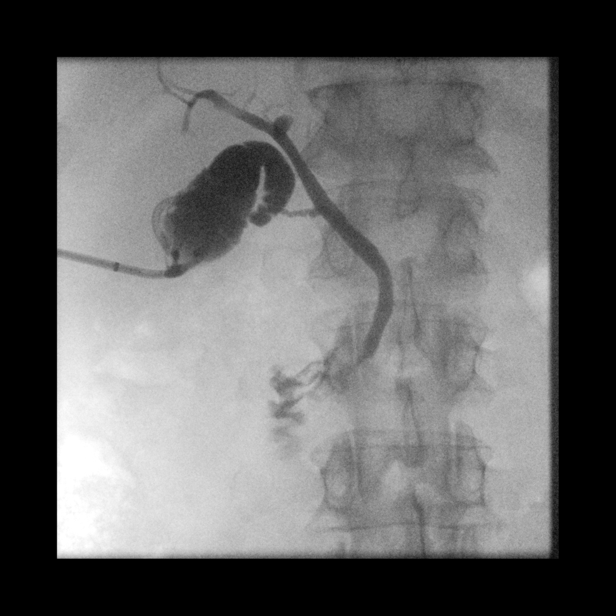
[im 4/4]
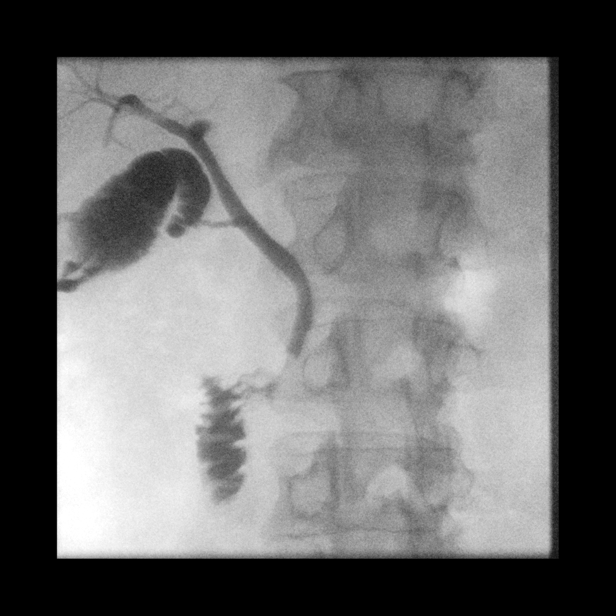
[im 4/4]
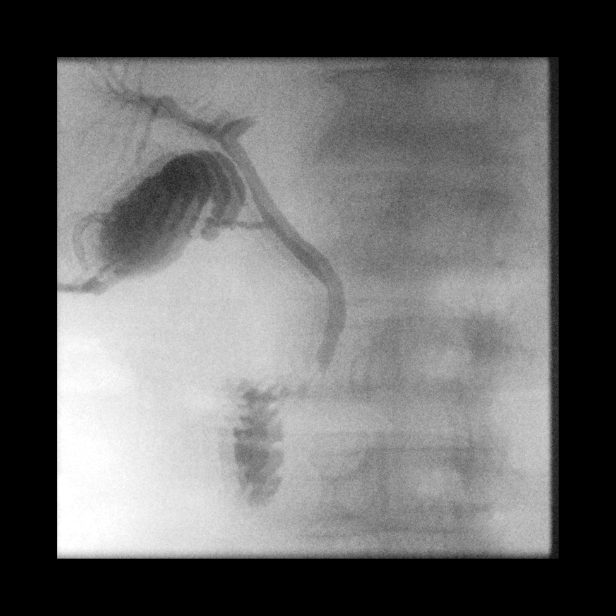
[im 4/4]
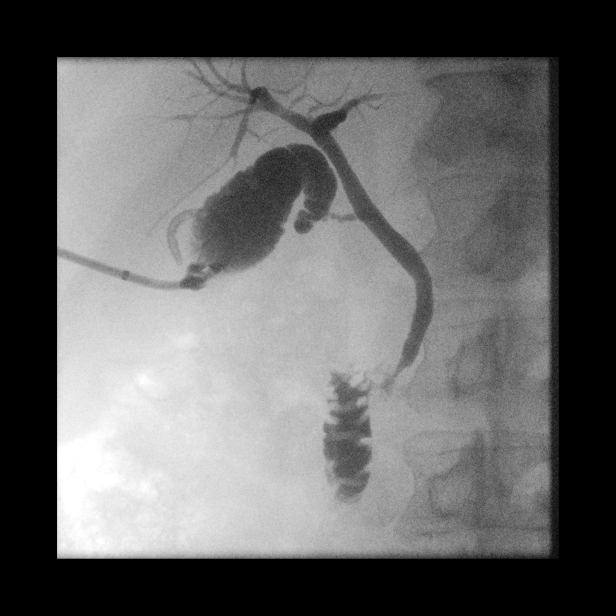
[im 4/4]
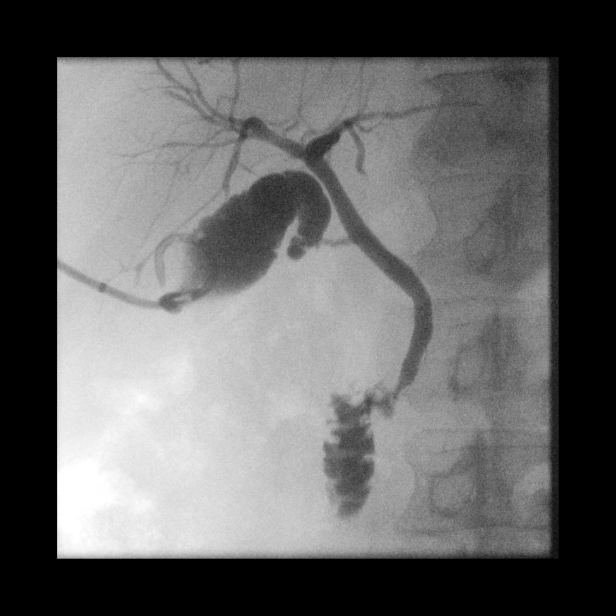

[13 of 13 positions shown; findings below may reference images not displayed]

EXAM:
CHOLANGIOGRAM THROUGH PRE-EXISTING CATHETER

MEDICATIONS:
None

ANESTHESIA/SEDATION:
None

CONTRAST:  20 mL Omnipaque 350

FLUOROSCOPY TIME:  Fluoroscopy Time: 48 seconds.  13.0 mGy.

COMPLICATIONS:
None immediate.

PROCEDURE:
Informed written consent was obtained from the patient after a
thorough discussion of the procedural risks, benefits and
alternatives. All questions were addressed. Maximal Sterile Barrier
Technique was utilized including caps, mask, sterile gowns, sterile
gloves, sterile drape, hand hygiene and skin antiseptic. A timeout
was performed prior to the initiation of the procedure.

The pre-existing cholecystostomy tube was injected with contrast
material. Fluoroscopic cine loop sequences were saved during
contrast injection. The cholecystostomy tube was flushed with
sterile saline and reconnected to a gravity drainage bag.
FINDINGS: Injection demonstrates a decompressed gallbladder lumen without
significant filling defects. The cystic duct is patent. There is
outflow into a normal caliber common bile duct without filling
defect. Contrast enters the duodenum normally. Limited visualization
of intrahepatic ducts which appear unremarkable.
IMPRESSION: Normal cholangiogram via pre-existing cholecystostomy tube
demonstrating normal patency of the cystic duct and common bile duct
with contrast entering the duodenum. No evidence of
choledocholithiasis.

## 2022-03-06 ENCOUNTER — Encounter: Payer: Medicaid Other | Admitting: Surgery

## 2022-03-09 ENCOUNTER — Encounter: Payer: Medicaid Other | Admitting: Surgery

## 2022-08-19 ENCOUNTER — Telehealth: Payer: Self-pay

## 2022-08-19 ENCOUNTER — Other Ambulatory Visit: Payer: Self-pay

## 2022-08-19 ENCOUNTER — Ambulatory Visit (INDEPENDENT_AMBULATORY_CARE_PROVIDER_SITE_OTHER): Payer: Medicaid Other | Admitting: Surgery

## 2022-08-19 ENCOUNTER — Encounter: Payer: Self-pay | Admitting: Surgery

## 2022-08-19 VITALS — BP 131/90 | HR 71 | Temp 98.1°F | Ht 72.0 in | Wt 207.0 lb

## 2022-08-19 DIAGNOSIS — R112 Nausea with vomiting, unspecified: Secondary | ICD-10-CM

## 2022-08-19 NOTE — Patient Instructions (Addendum)
Ultrasound scheduled @ [facility] on 08/21/2022 @ 8:15 am. Nothing to eat/drink 8 hours prior. [place] entrance.  We will call you with the results.

## 2022-08-19 NOTE — Telephone Encounter (Signed)
Left message to call office.  [facility] Ultrasound scheduled 08/21/22 @ 8:15 am @ [place]. Nothing to eat 8 hours prior .

## 2022-08-20 ENCOUNTER — Telehealth: Payer: Self-pay

## 2022-08-20 NOTE — Telephone Encounter (Signed)
Patient notified of Ultrasound appointment 11/3 @ 8:15 am [facility]-nothing to eat 8 hours prior. Dr.Piscoya will call patient with results.

## 2022-08-21 ENCOUNTER — Encounter: Payer: Self-pay | Admitting: Surgery

## 2022-08-21 ENCOUNTER — Ambulatory Visit: Admission: RE | Admit: 2022-08-21 | Payer: Medicaid Other | Source: Ambulatory Visit

## 2022-08-21 NOTE — Progress Notes (Signed)
08/19/2022  History of Present Illness: Gregory Oconnell is a 56 y.o. male presenting for evaluation of nausea and vomiting.  Patient is status post Hartman's procedure and repair of bladder injury on 08/08/2021.  He is also status post robotic assisted cholecystectomy on 10/28/2021.  He is also status post robotic assisted colostomy takedown on 02/10/2022.  He presents today because he has been having episodes of nausea and vomiting since after the last surgery.  Initially postoperatively, he was getting Phenergan for some nausea control.  He called his PCP to ask for further Phenergan but per the patient, this was declined.  The patient denies any particular pain in his abdomen but just reports nausea and vomiting after eating.  No specific relationship with type of food.  Denies any other issues.  Denies any recent drug use.  Past Medical History: Past Medical History:  Diagnosis Date   Arthritis    Bulging lumbar disc    Chronic back pain    Depression    Diverticulitis    GERD (gastroesophageal reflux disease)    Hypertension    Kidney disease      Past Surgical History: Past Surgical History:  Procedure Laterality Date   BLADDER REPAIR N/A 08/08/2021   Procedure: BLADDER REPAIR injury;  Surgeon: Olean Ree, MD;  Location: [facility] ORS;  Service: General;  Laterality: N/A;   CHOLECYSTECTOMY  10/28/2021   COLECTOMY WITH COLOSTOMY CREATION/HARTMANN PROCEDURE N/A 08/08/2021   Procedure: COLECTOMY WITH COLOSTOMY CREATION/HARTMANN PROCEDURE;  Surgeon: Olean Ree, MD;  Location: [facility] ORS;  Service: General;  Laterality: N/A;   COLON SURGERY     COLONOSCOPY WITH PROPOFOL N/A 02/03/2016   Procedure: COLONOSCOPY WITH PROPOFOL;  Surgeon: Hulen Luster, MD;  Location: [facility] ENDOSCOPY;  Service: Gastroenterology;  Laterality: N/A;   COLONOSCOPY WITH PROPOFOL N/A 11/11/2021   Procedure: COLONOSCOPY WITH PROPOFOL;  Surgeon: [name], MD;  Location[facility] ENDOSCOPY;  Service: Gastroenterology;   Laterality: N/A;   ESOPHAGOGASTRODUODENOSCOPY (EGD) WITH PROPOFOL N/A 02/03/2016   Procedure: ESOPHAGOGASTRODUODENOSCOPY (EGD) WITH PROPOFOL;  Surgeon: Hulen Luster, MD;  Location: [facility] ENDOSCOPY;  Service: Gastroenterology;  Laterality: N/A;   IR CHOLANGIOGRAM EXISTING TUBE  10/07/2021   IR PERC CHOLECYSTOSTOMY  08/28/2021   KNEE SURGERY Right    arthroscopy   LAPAROTOMY N/A 08/08/2021   Procedure: EXPLORATORY LAPAROTOMY;  Surgeon: Olean Ree, MD;  Location: [facility] ORS;  Service: General;  Laterality: N/A;   XI ROBOTIC ASSISTED COLOSTOMY TAKEDOWN N/A 02/10/2022   Procedure: XI ROBOTIC ASSISTED COLOSTOMY TAKEDOWN with Otho Ket, PA-C to assist;  Surgeon: Olean Ree, MD;  Location: [facility] ORS;  Service: General;  Laterality: N/A;    Home Medications: Prior to Admission medications   Medication Sig Start Date End Date Taking? Authorizing Provider  acetaminophen (TYLENOL) 500 MG tablet Take 2 tablets (1,000 mg total) by mouth every 6 (six) hours as needed for mild pain. 08/30/21  Yes Amilcar Reever, Jacqulyn Bath, MD  allopurinol (ZYLOPRIM) 300 MG tablet Take 300 mg by mouth every morning.   Yes [provider]  amLODipine (NORVASC) 5 MG tablet Take 5 mg by mouth every morning.   Yes [provider]  docusate sodium (COLACE) 100 MG capsule Take 100 mg by mouth 2 (two) times daily.   Yes [provider]  gabapentin (NEURONTIN) 800 MG tablet Take 800 mg by mouth 2 (two) times daily.   Yes [provider]  ibuprofen (ADVIL) 600 MG tablet Take 1 tablet (600 mg total) by mouth every 6 (six) hours  as needed. 02/13/22  Yes Tylene Fantasia, PA-C  MELATONIN PO Take 1 tablet by mouth at bedtime as needed (sleep).   Yes [provider]  methocarbamol (ROBAXIN) 500 MG tablet Take 1,000 mg by mouth every 6 (six) hours as needed.   Yes [provider]  Multiple Vitamin (MULTIVITAMIN WITH MINERALS) TABS tablet Take 1 tablet by mouth daily.   Yes [provider]   omeprazole (PRILOSEC) 40 MG capsule Take 40 mg by mouth daily.   Yes [provider]  polyethylene glycol-electrolytes (NULYTELY) 420 g solution Take by mouth. 10/27/21  Yes [provider]  promethazine (PHENERGAN) 12.5 MG tablet Take 1 tablet (12.5 mg total) by mouth every 6 (six) hours as needed for nausea or vomiting. 11/18/21  Yes Tylene Fantasia, PA-C  triamcinolone cream (KENALOG) 0.1 % Apply 1 application topically 2 (two) times daily as needed (rash).   Yes [provider]    Allergies: No Known Allergies  Review of Systems: Review of Systems  Constitutional:  Negative for chills and fever.  Respiratory:  Negative for shortness of breath.   Cardiovascular:  Negative for chest pain.  Gastrointestinal:  Positive for nausea and vomiting. Negative for abdominal pain.    Physical Exam BP (!) 131/90   Pulse 71   Temp 98.1 F (36.7 C) (Oral)   Ht 6' (1.829 m)   Wt 207 lb (93.9 kg)   SpO2 99%   BMI 28.07 kg/m  CONSTITUTIONAL: No acute distress HEENT:  Normocephalic, atraumatic, extraocular motion intact. RESPIRATORY:  Normal respiratory effort without pathologic use of accessory muscles. CARDIOVASCULAR: Regular rhythm and rate  GI: The abdomen is soft, nondistended, nontender to palpation.  Midline incision is healed well without any evidence of hernia.  Left lower quadrant ostomy site well-healed without any evidence of hernia.  NEUROLOGIC:  Motor and sensation is grossly normal.  Cranial nerves are grossly intact. PSYCH:  Alert and oriented to person, place and time. Affect is normal.   Assessment and Plan: This is a 56 y.o. male with nausea and vomiting.  - The patient ready had cholecystectomy would be unusual that almost a year out from his surgery, he would not be having symptoms.  The patient is not having any abdominal pain but more nausea and vomiting episodes.  As a precaution, discussed with him that we could order an ultrasound of right  upper quadrant to evaluate for any potential retained stone or fluid collection.  I will contact him with the results.  If that is negative, it may be that he needs a referral to gastroenterology for further evaluation. - Patient understands this plan and all of his questions have been answered.  I spent 20 minutes dedicated to the care of this patient on the date of this encounter to include pre-visit review of records, face-to-face time with the patient discussing diagnosis and management, and any post-visit coordination of care.   Melvyn Neth, [facility]

## 2022-08-31 ENCOUNTER — Ambulatory Visit
Admission: RE | Admit: 2022-08-31 | Discharge: 2022-08-31 | Disposition: A | Discharge status: Home / Self Care | Payer: Medicaid Other | Source: Ambulatory Visit | Attending: Surgery | Admitting: Surgery

## 2022-08-31 DIAGNOSIS — R112 Nausea with vomiting, unspecified: Secondary | ICD-10-CM | POA: Insufficient documentation

## 2022-09-02 NOTE — Progress Notes (Signed)
09/02/22 Called patient to discuss results of the ultrasound.  No complicating factors to indicate an etiology for his nausea/vomiting.  He has not had issues in about 2 weeks.  Recommended that he try to keep a food journal to see if there are specific foods that trigger the symptoms.  May be that his body is not well adjusted to not having a gallbladder and some foods are triggering these symptoms.  If no clear pattern, he can contact us again and we can do a referral to GI for further evaluation.  Olean Ree, MD

## 2024-04-24 ENCOUNTER — Encounter

## 2024-05-15 ENCOUNTER — Encounter

## 2024-06-12 ENCOUNTER — Encounter

## 2024-06-21 ENCOUNTER — Ambulatory Visit: Admit: 2024-06-21 | Payer: Self-pay | Admitting: Internal Medicine

## 2024-06-21 SURGERY — COLONOSCOPY
Anesthesia: General
